# Patient Record
Sex: Male | Born: 1935 | Race: White | Hispanic: No | Marital: Married | Smoking: Former smoker
Health system: Southern US, Community
[De-identification: ages and names within clinical notes are randomized; demographics above are authoritative.]

## PROBLEM LIST (undated history)

## (undated) DIAGNOSIS — M1990 Unspecified osteoarthritis, unspecified site: Secondary | ICD-10-CM

## (undated) DIAGNOSIS — C801 Malignant (primary) neoplasm, unspecified: Secondary | ICD-10-CM

## (undated) HISTORY — PX: FOOT SURGERY: SHX648

## (undated) HISTORY — PX: SKIN CANCER EXCISION: SHX779

## (undated) HISTORY — PX: CATARACT EXTRACTION: SUR2

---

## 2001-01-05 ENCOUNTER — Encounter: Payer: Self-pay | Admitting: Family Medicine

## 2001-01-05 ENCOUNTER — Ambulatory Visit (HOSPITAL_COMMUNITY): Admission: RE | Admit: 2001-01-05 | Discharge: 2001-01-05 | Payer: Self-pay | Admitting: Family Medicine

## 2013-02-02 ENCOUNTER — Other Ambulatory Visit: Payer: Self-pay | Admitting: Orthopedic Surgery

## 2013-02-02 NOTE — Progress Notes (Signed)
Preoperative surgical orders have been place into the Epic hospital system for Joshua Robinson on 02/02/2013, 12:50 PM  by [name] for surgery on 02/19/2013.  Preop Total Knee orders including Experal, PO Tylenol, and IV Decadron as long as there are no contraindications to the above medications. Avel Peace, PA-C  .

## 2013-02-06 ENCOUNTER — Encounter (HOSPITAL_COMMUNITY): Payer: Self-pay | Admitting: Pharmacy Technician

## 2013-02-12 ENCOUNTER — Ambulatory Visit (HOSPITAL_COMMUNITY)
Admission: RE | Admit: 2013-02-12 | Discharge: 2013-02-12 | Disposition: A | Discharge status: Home / Self Care | Payer: Medicare Other | Source: Ambulatory Visit | Attending: Orthopedic Surgery | Admitting: Orthopedic Surgery

## 2013-02-12 ENCOUNTER — Other Ambulatory Visit: Payer: Self-pay

## 2013-02-12 ENCOUNTER — Encounter (HOSPITAL_COMMUNITY): Payer: Self-pay

## 2013-02-12 ENCOUNTER — Encounter (HOSPITAL_COMMUNITY)
Admission: RE | Admit: 2013-02-12 | Discharge: 2013-02-12 | Disposition: A | Discharge status: Home / Self Care | Payer: Medicare Other | Source: Ambulatory Visit | Attending: Orthopedic Surgery | Admitting: Orthopedic Surgery

## 2013-02-12 DIAGNOSIS — Z01812 Encounter for preprocedural laboratory examination: Secondary | ICD-10-CM | POA: Insufficient documentation

## 2013-02-12 DIAGNOSIS — Z01818 Encounter for other preprocedural examination: Secondary | ICD-10-CM | POA: Insufficient documentation

## 2013-02-12 DIAGNOSIS — M1710 Unilateral primary osteoarthritis, unspecified knee: Secondary | ICD-10-CM | POA: Insufficient documentation

## 2013-02-12 DIAGNOSIS — Z01810 Encounter for preprocedural cardiovascular examination: Secondary | ICD-10-CM | POA: Insufficient documentation

## 2013-02-12 HISTORY — DX: Malignant (primary) neoplasm, unspecified: C80.1

## 2013-02-12 HISTORY — DX: Unspecified osteoarthritis, unspecified site: M19.90

## 2013-02-12 LAB — COMPREHENSIVE METABOLIC PANEL
Alkaline Phosphatase: 61 U/L (ref 39–117)
BUN: 17 mg/dL (ref 6–23)
CO2: 29 mEq/L (ref 19–32)
Chloride: 99 mEq/L (ref 96–112)
Creatinine, Ser: 0.81 mg/dL (ref 0.50–1.35)
GFR calc non Af Amer: 84 mL/min — ABNORMAL LOW (ref >90)
Glucose, Bld: 100 mg/dL — ABNORMAL HIGH (ref 70–99)
Total Bilirubin: 0.7 mg/dL (ref 0.3–1.2)

## 2013-02-12 LAB — CBC
HCT: 44 % (ref 39.0–52.0)
Hemoglobin: 15.7 g/dL (ref 13.0–17.0)
MCH: 33.9 pg (ref 26.0–34.0)
MCV: 95 fL (ref 78.0–100.0)
RBC: 4.63 MIL/uL (ref 4.22–5.81)
WBC: 7.3 10*3/uL (ref 4.0–10.5)

## 2013-02-12 LAB — URINALYSIS, ROUTINE W REFLEX MICROSCOPIC
Bilirubin Urine: NEGATIVE
Glucose, UA: NEGATIVE mg/dL
Hgb urine dipstick: NEGATIVE
Ketones, ur: NEGATIVE mg/dL
Leukocytes, UA: NEGATIVE
Nitrite: NEGATIVE
Specific Gravity, Urine: 1.011 (ref 1.005–1.030)
Urobilinogen, UA: 0.2 mg/dL (ref 0.0–1.0)

## 2013-02-12 LAB — PROTIME-INR
INR: 0.95 (ref 0.00–1.49)
Prothrombin Time: 12.5 seconds (ref 11.6–15.2)

## 2013-02-12 LAB — ABO/RH: ABO/RH(D): A POS

## 2013-02-12 LAB — SURGICAL PCR SCREEN: Staphylococcus aureus: NEGATIVE

## 2013-02-12 NOTE — Progress Notes (Signed)
Surgery clearance note 12/25/12 Dr. Regino Schultze on chart

## 2013-02-12 NOTE — Patient Instructions (Addendum)
22 Joshua Robinson  02/12/2013   Your procedure is scheduled on: 02/19/13  Report to [facility] at 7:30 AM.  Call this number if you have problems the morning of surgery [phone]: [phone]   Remember:   Do not eat food or drink liquids After Midnight.      Do not wear jewelry, make-up or nail polish.  Do not wear lotions, powders, or perfumes. You may wear deodorant.  Do not shave 48 hours prior to surgery. Men may shave face and neck.  Do not bring valuables to the hospital.  Contacts, dentures or bridgework may not be worn into surgery.  Leave suitcase in the car. After surgery it may be brought to your room.  For patients admitted to the hospital, checkout time is 11:00 AM the day of discharge.   Please read over the following fact sheets that you were given: MRSA Information, incentive spirometry fact sheet, blood fact sheet Birdie Sons, RN  pre op nurse call if needed [phone]    FAILURE TO FOLLOW THESE INSTRUCTIONS MAY RESULT IN CANCELLATION OF YOUR SURGERY   Patient Signature: ___________________________________________

## 2013-02-16 ENCOUNTER — Other Ambulatory Visit: Payer: Self-pay | Admitting: Surgical

## 2013-02-16 NOTE — H&P (Signed)
TOTAL KNEE ADMISSION H&P  Patient is being admitted for right total knee arthroplasty.  Subjective:  Chief Complaint:right knee pain.  HPI: Joshua Robinson, 78 y.o. male, has a history of pain and functional disability in the right knee due to arthritis and has failed non-surgical conservative treatments for greater than 12 weeks to includeNSAID's and/or analgesics, corticosteriod injections, flexibility and strengthening excercises and activity modification.  Onset of symptoms was gradual, starting 5 years ago with gradually worsening course since that time. The patient noted no past surgery on the right knee(s).  Patient currently rates pain in the right knee(s) at 6 out of 10 with activity. Patient has night pain, worsening of pain with activity and weight bearing, pain that interferes with activities of daily living, pain with passive range of motion, crepitus and joint swelling.  Patient has evidence of periarticular osteophytes and joint space narrowing by imaging studies. There is no active infection.  Past Medical History  Diagnosis Date  . Cancer     skin  . Arthritis     Past Surgical History  Procedure Laterality Date  . Cataract extraction Right   . Skin cancer excision Right     cheek  . Foot surgery Bilateral 78 years old    "painful flat foot surgery"     Current outpatient prescriptions: fish oil-omega-3 fatty acids 1000 MG capsule, Take 1 g by mouth daily., Disp: , Rfl: ;   Multiple Vitamin (MULTIVITAMIN WITH MINERALS) TABS tablet, Take 1 tablet by mouth daily., Disp: , Rfl: ;   naproxen sodium (ANAPROX) 220 MG tablet, Take 220 mg by mouth 2 (two) times daily as needed (for pain)., Disp: , Rfl:  polyvinyl alcohol (LIQUIFILM TEARS) 1.4 % ophthalmic solution, Place 1 drop into both eyes daily as needed (dry eyes)., Disp: , Rfl:   Allergies  Allergen Reactions  . Bee Venom Other (See Comments)    Stung a few times and passed out    History  Substance Use Topics   . Smoking status: Former Smoker -- 40 years    Types: Cigarettes    Quit date: 05/17/1996  . Smokeless tobacco: Current User    Types: Snuff  . Alcohol Use: No    Family History Father deceased age 74 due to heart disease Mother deceased age 18 due to complications from DM type II  Review of Systems  Constitutional: Negative.   HENT: Negative.  Negative for neck pain.   Eyes: Negative.   Respiratory: Positive for shortness of breath. Negative for cough, hemoptysis, sputum production and wheezing.   Cardiovascular: Negative.   Gastrointestinal: Negative.   Genitourinary: Negative.   Musculoskeletal: Positive for joint pain. Negative for myalgias, back pain and falls.       Right knee pain  Skin: Negative.   Neurological: Negative.   Endo/Heme/Allergies: Negative.   Psychiatric/Behavioral: Negative.     Objective:  Physical Exam  Constitutional: He is oriented to person, place, and time. He appears well-developed and well-nourished. No distress.  HENT:  Head: Normocephalic and atraumatic.  Right Ear: External ear normal.  Left Ear: External ear normal.  Mouth/Throat: Oropharynx is clear and moist.  Eyes: Conjunctivae and EOM are normal.  Neck: Normal range of motion. Neck supple. No thyromegaly present.  Cardiovascular: Normal rate, regular rhythm, normal heart sounds and intact distal pulses.   No murmur heard. Respiratory: Effort normal and breath sounds normal. No respiratory distress. He has no wheezes.  GI: Soft. Bowel sounds are normal. He exhibits no  distension. There is no tenderness.  Musculoskeletal:       Right hip: Normal.       Left hip: Normal.       Right knee: He exhibits decreased range of motion and swelling. He exhibits no effusion and no erythema. Tenderness found. Medial joint line and lateral joint line tenderness noted.       Left knee: Normal.       Right lower leg: He exhibits no tenderness and no swelling.       Left lower leg: He exhibits no  tenderness and no swelling.  Evaluation of his hips, normal range of motion and no discomfort. The left knee no effusion. Range 0 to 125. Slight crepitus on range of motion. No tenderness or instability. The right knee no effusion. Range about 5 to 125. Varus deformity. Marked crepitus on range of motion. Tender medial greater than lateral. No instability.  Neurological: He is alert and oriented to person, place, and time. He has normal strength and normal reflexes. No sensory deficit.  Skin: No rash noted. He is not diaphoretic. No erythema.  Psychiatric: He has a normal mood and affect. His behavior is normal.   Vitals Weight: 160 lb Height: 69 in Body Surface Area: 1.88 m Body Mass Index: 23.63 kg/m Pulse: 66 (Regular) BP: 126/78 (Sitting, Left Arm, Standard)  Imaging Review Plain radiographs demonstrate severe degenerative joint disease of the right knee(s). The overall alignment ismild varus. The bone quality appears to be adequate for age and reported activity level.  Assessment/Plan:  End stage arthritis, right knee   The patient history, physical examination, clinical judgment of the provider and imaging studies are consistent with end stage degenerative joint disease of the right knee(s) and total knee arthroplasty is deemed medically necessary. The treatment options including medical management, injection therapy arthroscopy and arthroplasty were discussed at length. The risks and benefits of total knee arthroplasty were presented and reviewed. The risks due to aseptic loosening, infection, stiffness, patella tracking problems, thromboembolic complications and other imponderables were discussed. The patient acknowledged the explanation, agreed to proceed with the plan and consent was signed. Patient is being admitted for inpatient treatment for surgery, pain control, PT, OT, prophylactic antibiotics, VTE prophylaxis, progressive ambulation and ADL's and discharge  planning. The patient is planning to be discharged home with home health services   Patient can receive TXA   Dimitri Ped, PA-C

## 2013-02-19 ENCOUNTER — Inpatient Hospital Stay (HOSPITAL_COMMUNITY): Payer: Medicare Other | Admitting: Certified Registered Nurse Anesthetist

## 2013-02-19 ENCOUNTER — Encounter (HOSPITAL_COMMUNITY)
Admission: RE | Disposition: A | Discharge status: Home Health | Payer: Self-pay | Source: Ambulatory Visit | Attending: Orthopedic Surgery

## 2013-02-19 ENCOUNTER — Encounter (HOSPITAL_COMMUNITY): Payer: Self-pay | Admitting: Certified Registered Nurse Anesthetist

## 2013-02-19 ENCOUNTER — Inpatient Hospital Stay (HOSPITAL_COMMUNITY)
Admission: RE | Admit: 2013-02-19 | Discharge: 2013-02-21 | DRG: 470 | Disposition: A | Discharge status: Home Health | Payer: Medicare Other | Source: Ambulatory Visit | Attending: Orthopedic Surgery | Admitting: Orthopedic Surgery

## 2013-02-19 ENCOUNTER — Encounter (HOSPITAL_COMMUNITY): Payer: Self-pay

## 2013-02-19 DIAGNOSIS — E871 Hypo-osmolality and hyponatremia: Secondary | ICD-10-CM

## 2013-02-19 DIAGNOSIS — Z96651 Presence of right artificial knee joint: Secondary | ICD-10-CM

## 2013-02-19 DIAGNOSIS — Z85828 Personal history of other malignant neoplasm of skin: Secondary | ICD-10-CM

## 2013-02-19 DIAGNOSIS — M1710 Unilateral primary osteoarthritis, unspecified knee: Principal | ICD-10-CM | POA: Diagnosis present

## 2013-02-19 DIAGNOSIS — Z87891 Personal history of nicotine dependence: Secondary | ICD-10-CM

## 2013-02-19 DIAGNOSIS — Z79899 Other long term (current) drug therapy: Secondary | ICD-10-CM

## 2013-02-19 DIAGNOSIS — D62 Acute posthemorrhagic anemia: Secondary | ICD-10-CM

## 2013-02-19 HISTORY — PX: TOTAL KNEE ARTHROPLASTY: SHX125

## 2013-02-19 LAB — TYPE AND SCREEN: Antibody Screen: NEGATIVE

## 2013-02-19 SURGERY — ARTHROPLASTY, KNEE, TOTAL
Anesthesia: General | Site: Knee | Laterality: Right | Wound class: Clean

## 2013-02-19 MED ORDER — HYDROMORPHONE HCL PF 1 MG/ML IJ SOLN
INTRAMUSCULAR | Status: AC
  Filled 2013-02-19: qty 1

## 2013-02-19 MED ORDER — DEXAMETHASONE SODIUM PHOSPHATE 10 MG/ML IJ SOLN: 10.0000 mg | Freq: Once | INTRAMUSCULAR | Status: DC

## 2013-02-19 MED ORDER — METHOCARBAMOL 500 MG PO TABS
500.0000 mg | ORAL_TABLET | Freq: Four times a day (QID) | ORAL | Status: DC | PRN
  Administered 2013-02-20 – 2013-02-21 (×3): 500 mg via ORAL
  Filled 2013-02-19 (×3): qty 1

## 2013-02-19 MED ORDER — CEFAZOLIN SODIUM-DEXTROSE 2-3 GM-% IV SOLR
INTRAVENOUS | Status: AC
  Filled 2013-02-19: qty 50

## 2013-02-19 MED ORDER — ONDANSETRON HCL 4 MG/2ML IJ SOLN
4.0000 mg | Freq: Four times a day (QID) | INTRAMUSCULAR | Status: DC | PRN
  Administered 2013-02-19: 4 mg via INTRAVENOUS
  Filled 2013-02-19: qty 2

## 2013-02-19 MED ORDER — BISACODYL 10 MG RE SUPP: 10.0000 mg | Freq: Every day | RECTAL | Status: DC | PRN

## 2013-02-19 MED ORDER — SODIUM CHLORIDE 0.9 % IJ SOLN
INTRAMUSCULAR | Status: DC | PRN
  Administered 2013-02-19: 30 mL

## 2013-02-19 MED ORDER — ONDANSETRON HCL 4 MG/2ML IJ SOLN
INTRAMUSCULAR | Status: DC | PRN
  Administered 2013-02-19: 4 mg via INTRAMUSCULAR

## 2013-02-19 MED ORDER — FLEET ENEMA 7-19 GM/118ML RE ENEM: 1.0000 | ENEMA | Freq: Once | RECTAL | Status: AC | PRN

## 2013-02-19 MED ORDER — METOCLOPRAMIDE HCL 10 MG PO TABS: 5.0000 mg | ORAL_TABLET | Freq: Three times a day (TID) | ORAL | Status: DC | PRN

## 2013-02-19 MED ORDER — CEFAZOLIN SODIUM 1-5 GM-% IV SOLN
1.0000 g | Freq: Four times a day (QID) | INTRAVENOUS | Status: AC
  Administered 2013-02-19 (×2): 1 g via INTRAVENOUS
  Filled 2013-02-19 (×2): qty 50

## 2013-02-19 MED ORDER — SODIUM CHLORIDE 0.9 % IJ SOLN
INTRAMUSCULAR | Status: AC
  Filled 2013-02-19: qty 50

## 2013-02-19 MED ORDER — DEXAMETHASONE SODIUM PHOSPHATE 10 MG/ML IJ SOLN
10.0000 mg | Freq: Every day | INTRAMUSCULAR | Status: AC
  Filled 2013-02-19: qty 1

## 2013-02-19 MED ORDER — ONDANSETRON HCL 4 MG PO TABS: 4.0000 mg | ORAL_TABLET | Freq: Four times a day (QID) | ORAL | Status: DC | PRN

## 2013-02-19 MED ORDER — POLYVINYL ALCOHOL 1.4 % OP SOLN
1.0000 [drp] | Freq: Every day | OPHTHALMIC | Status: DC | PRN
  Filled 2013-02-19: qty 15

## 2013-02-19 MED ORDER — PROPOFOL 10 MG/ML IV BOLUS
INTRAVENOUS | Status: DC | PRN
  Administered 2013-02-19: 150 mg via INTRAVENOUS

## 2013-02-19 MED ORDER — POLYETHYLENE GLYCOL 3350 17 G PO PACK: 17.0000 g | PACK | Freq: Every day | ORAL | Status: DC | PRN

## 2013-02-19 MED ORDER — DIPHENHYDRAMINE HCL 12.5 MG/5ML PO ELIX: 12.5000 mg | ORAL_SOLUTION | ORAL | Status: DC | PRN

## 2013-02-19 MED ORDER — MENTHOL 3 MG MT LOZG: 1.0000 | LOZENGE | OROMUCOSAL | Status: DC | PRN

## 2013-02-19 MED ORDER — BUPIVACAINE HCL (PF) 0.25 % IJ SOLN
INTRAMUSCULAR | Status: AC
  Filled 2013-02-19: qty 30

## 2013-02-19 MED ORDER — LIDOCAINE HCL (CARDIAC) 20 MG/ML IV SOLN
INTRAVENOUS | Status: DC | PRN
  Administered 2013-02-19: 50 mg via INTRAVENOUS

## 2013-02-19 MED ORDER — BUPIVACAINE LIPOSOME 1.3 % IJ SUSP
20.0000 mL | Freq: Once | INTRAMUSCULAR | Status: DC
  Filled 2013-02-19: qty 20

## 2013-02-19 MED ORDER — ACETAMINOPHEN 500 MG PO TABS
1000.0000 mg | ORAL_TABLET | Freq: Four times a day (QID) | ORAL | Status: AC
  Administered 2013-02-19 – 2013-02-20 (×4): 1000 mg via ORAL
  Filled 2013-02-19 (×5): qty 2

## 2013-02-19 MED ORDER — CEFAZOLIN SODIUM-DEXTROSE 2-3 GM-% IV SOLR
2.0000 g | INTRAVENOUS | Status: AC
  Administered 2013-02-19: 2 g via INTRAVENOUS

## 2013-02-19 MED ORDER — LACTATED RINGERS IV SOLN: INTRAVENOUS | Status: DC

## 2013-02-19 MED ORDER — BUPIVACAINE HCL 0.25 % IJ SOLN
INTRAMUSCULAR | Status: DC | PRN
  Administered 2013-02-19: 30 mL

## 2013-02-19 MED ORDER — DEXTROSE-NACL 5-0.9 % IV SOLN
INTRAVENOUS | Status: DC
  Administered 2013-02-19 – 2013-02-20 (×2): via INTRAVENOUS

## 2013-02-19 MED ORDER — DEXAMETHASONE 4 MG PO TABS
10.0000 mg | ORAL_TABLET | Freq: Every day | ORAL | Status: AC
  Administered 2013-02-20: 10 mg via ORAL
  Filled 2013-02-19: qty 1

## 2013-02-19 MED ORDER — PHENOL 1.4 % MT LIQD: 1.0000 | OROMUCOSAL | Status: DC | PRN

## 2013-02-19 MED ORDER — FENTANYL CITRATE 0.05 MG/ML IJ SOLN
INTRAMUSCULAR | Status: DC | PRN
  Administered 2013-02-19 (×2): 100 ug via INTRAVENOUS

## 2013-02-19 MED ORDER — OXYCODONE HCL 5 MG PO TABS
5.0000 mg | ORAL_TABLET | ORAL | Status: DC | PRN
  Administered 2013-02-19 – 2013-02-20 (×4): 5 mg via ORAL
  Administered 2013-02-20: 10 mg via ORAL
  Administered 2013-02-20 – 2013-02-21 (×2): 5 mg via ORAL
  Filled 2013-02-19 (×6): qty 1
  Filled 2013-02-19: qty 2

## 2013-02-19 MED ORDER — METOCLOPRAMIDE HCL 5 MG/ML IJ SOLN
5.0000 mg | Freq: Three times a day (TID) | INTRAMUSCULAR | Status: DC | PRN
  Administered 2013-02-19: 10 mg via INTRAVENOUS
  Filled 2013-02-19: qty 2

## 2013-02-19 MED ORDER — ACETAMINOPHEN 500 MG PO TABS
1000.0000 mg | ORAL_TABLET | Freq: Once | ORAL | Status: AC
  Administered 2013-02-19: 1000 mg via ORAL
  Filled 2013-02-19: qty 2

## 2013-02-19 MED ORDER — HYDROMORPHONE HCL PF 1 MG/ML IJ SOLN
0.2500 mg | INTRAMUSCULAR | Status: DC | PRN
Start: 2013-02-19 — End: 2013-02-19
  Administered 2013-02-19 (×2): 0.5 mg via INTRAVENOUS

## 2013-02-19 MED ORDER — BUPIVACAINE LIPOSOME 1.3 % IJ SUSP
INTRAMUSCULAR | Status: DC | PRN
  Administered 2013-02-19: 20 mL

## 2013-02-19 MED ORDER — DOCUSATE SODIUM 100 MG PO CAPS
100.0000 mg | ORAL_CAPSULE | Freq: Two times a day (BID) | ORAL | Status: DC
  Administered 2013-02-19 – 2013-02-20 (×3): 100 mg via ORAL

## 2013-02-19 MED ORDER — METHOCARBAMOL 100 MG/ML IJ SOLN
500.0000 mg | Freq: Four times a day (QID) | INTRAVENOUS | Status: DC | PRN
  Administered 2013-02-19: 500 mg via INTRAVENOUS
  Filled 2013-02-19: qty 5

## 2013-02-19 MED ORDER — TRAMADOL HCL 50 MG PO TABS: 50.0000 mg | ORAL_TABLET | Freq: Four times a day (QID) | ORAL | Status: DC | PRN

## 2013-02-19 MED ORDER — DEXAMETHASONE SODIUM PHOSPHATE 10 MG/ML IJ SOLN
INTRAMUSCULAR | Status: DC | PRN
  Administered 2013-02-19: 10 mg via INTRAVENOUS

## 2013-02-19 MED ORDER — LACTATED RINGERS IV SOLN
INTRAVENOUS | Status: DC
  Administered 2013-02-19: 1000 mL via INTRAVENOUS
  Administered 2013-02-19 (×3): via INTRAVENOUS

## 2013-02-19 MED ORDER — MORPHINE SULFATE 2 MG/ML IJ SOLN
1.0000 mg | INTRAMUSCULAR | Status: DC | PRN
  Administered 2013-02-19: 2 mg via INTRAVENOUS
  Filled 2013-02-19: qty 1

## 2013-02-19 MED ORDER — KETOROLAC TROMETHAMINE 15 MG/ML IJ SOLN: 7.5000 mg | Freq: Four times a day (QID) | INTRAMUSCULAR | Status: AC | PRN

## 2013-02-19 MED ORDER — HYDROMORPHONE HCL PF 1 MG/ML IJ SOLN
INTRAMUSCULAR | Status: DC | PRN
  Administered 2013-02-19 (×2): 1 mg via INTRAVENOUS

## 2013-02-19 MED ORDER — SODIUM CHLORIDE 0.9 % IV SOLN: INTRAVENOUS | Status: DC

## 2013-02-19 MED ORDER — SUCCINYLCHOLINE CHLORIDE 20 MG/ML IJ SOLN
INTRAMUSCULAR | Status: DC | PRN
  Administered 2013-02-19: 100 mg via INTRAVENOUS

## 2013-02-19 MED ORDER — RIVAROXABAN 10 MG PO TABS
10.0000 mg | ORAL_TABLET | Freq: Every day | ORAL | Status: DC
  Administered 2013-02-20 – 2013-02-21 (×2): 10 mg via ORAL
  Filled 2013-02-19 (×3): qty 1

## 2013-02-19 MED ORDER — TRANEXAMIC ACID 100 MG/ML IV SOLN
1000.0000 mg | INTRAVENOUS | Status: AC
  Administered 2013-02-19: 1000 mg via INTRAVENOUS
  Filled 2013-02-19: qty 10

## 2013-02-19 SURGICAL SUPPLY — 57 items
BAG ZIPLOCK 12X15 (MISCELLANEOUS) ×2 IMPLANT
BANDAGE ELASTIC 6 VELCRO ST LF (GAUZE/BANDAGES/DRESSINGS) ×2 IMPLANT
BANDAGE ESMARK 6X9 LF (GAUZE/BANDAGES/DRESSINGS) ×1 IMPLANT
BLADE SAG 18X100X1.27 (BLADE) ×2 IMPLANT
BLADE SAW SGTL 11.0X1.19X90.0M (BLADE) ×2 IMPLANT
BNDG ESMARK 6X9 LF (GAUZE/BANDAGES/DRESSINGS) ×2
BOWL SMART MIX CTS (DISPOSABLE) ×2 IMPLANT
CAPT RP KNEE ×2 IMPLANT
CEMENT HV SMART SET (Cement) ×4 IMPLANT
CLOSURE STERI-STRIP 1/4X4 (GAUZE/BANDAGES/DRESSINGS) ×2 IMPLANT
CLOTH BEACON ORANGE TIMEOUT ST (SAFETY) IMPLANT
CUFF TOURN SGL QUICK 34 (TOURNIQUET CUFF) ×1
CUFF TRNQT CYL 34X4X40X1 (TOURNIQUET CUFF) ×1 IMPLANT
DECANTER SPIKE VIAL GLASS SM (MISCELLANEOUS) ×2 IMPLANT
DRAPE EXTREMITY T 121X128X90 (DRAPE) ×2 IMPLANT
DRAPE POUCH INSTRU U-SHP 10X18 (DRAPES) ×2 IMPLANT
DRAPE U-SHAPE 47X51 STRL (DRAPES) ×2 IMPLANT
DRSG ADAPTIC 3X8 NADH LF (GAUZE/BANDAGES/DRESSINGS) ×2 IMPLANT
DRSG PAD ABDOMINAL 8X10 ST (GAUZE/BANDAGES/DRESSINGS) ×2 IMPLANT
DURAPREP 26ML APPLICATOR (WOUND CARE) ×2 IMPLANT
ELECT REM PT RETURN 9FT ADLT (ELECTROSURGICAL) ×2
ELECTRODE REM PT RTRN 9FT ADLT (ELECTROSURGICAL) ×1 IMPLANT
EVACUATOR 1/8 PVC DRAIN (DRAIN) ×2 IMPLANT
FACESHIELD LNG OPTICON STERILE (SAFETY) ×12 IMPLANT
GLOVE BIO SURGEON STRL SZ7.5 (GLOVE) ×2 IMPLANT
GLOVE BIO SURGEON STRL SZ8 (GLOVE) ×2 IMPLANT
GLOVE BIOGEL PI IND STRL 8 (GLOVE) ×2 IMPLANT
GLOVE BIOGEL PI INDICATOR 8 (GLOVE) ×2
GLOVE SURG SS PI 6.5 STRL IVOR (GLOVE) ×2 IMPLANT
GOWN PREVENTION PLUS LG XLONG (DISPOSABLE) ×2 IMPLANT
GOWN STRL REIN XL XLG (GOWN DISPOSABLE) IMPLANT
HANDPIECE INTERPULSE COAX TIP (DISPOSABLE) ×1
IMMOBILIZER KNEE 20 (SOFTGOODS) ×2
IMMOBILIZER KNEE 20 THIGH 36 (SOFTGOODS) ×1 IMPLANT
KIT BASIN OR (CUSTOM PROCEDURE TRAY) ×2 IMPLANT
MANIFOLD NEPTUNE II (INSTRUMENTS) ×2 IMPLANT
NDL SAFETY ECLIPSE 18X1.5 (NEEDLE) ×2 IMPLANT
NEEDLE HYPO 18GX1.5 SHARP (NEEDLE) ×2
NS IRRIG 1000ML POUR BTL (IV SOLUTION) ×2 IMPLANT
PACK TOTAL JOINT (CUSTOM PROCEDURE TRAY) ×2 IMPLANT
PADDING CAST COTTON 6X4 STRL (CAST SUPPLIES) ×2 IMPLANT
POSITIONER SURGICAL ARM (MISCELLANEOUS) ×2 IMPLANT
SET HNDPC FAN SPRY TIP SCT (DISPOSABLE) ×1 IMPLANT
SPONGE GAUZE 4X4 12PLY (GAUZE/BANDAGES/DRESSINGS) ×2 IMPLANT
STRIP CLOSURE SKIN 1/2X4 (GAUZE/BANDAGES/DRESSINGS) ×4 IMPLANT
SUCTION FRAZIER 12FR DISP (SUCTIONS) ×2 IMPLANT
SUT MNCRL AB 4-0 PS2 18 (SUTURE) ×2 IMPLANT
SUT VIC AB 2-0 CT1 27 (SUTURE) ×3
SUT VIC AB 2-0 CT1 TAPERPNT 27 (SUTURE) ×3 IMPLANT
SUT VLOC 180 0 24IN GS25 (SUTURE) ×2 IMPLANT
SYR 20CC LL (SYRINGE) ×2 IMPLANT
SYR 50ML LL SCALE MARK (SYRINGE) ×2 IMPLANT
TOWEL OR 17X26 10 PK STRL BLUE (TOWEL DISPOSABLE) ×4 IMPLANT
TRAY FOLEY CATH 14FRSI W/METER (CATHETERS) IMPLANT
TRAY FOLEY CATH 16FRSI W/METER (SET/KITS/TRAYS/PACK) ×2 IMPLANT
WATER STERILE IRR 1500ML POUR (IV SOLUTION) ×2 IMPLANT
WRAP KNEE MAXI GEL POST OP (GAUZE/BANDAGES/DRESSINGS) ×2 IMPLANT

## 2013-02-19 NOTE — Anesthesia Preprocedure Evaluation (Signed)
Anesthesia Evaluation  Patient identified by MRN, date of birth, ID band Patient awake    Reviewed: Allergy & Precautions, H&P , NPO status , Patient's Chart, lab work & pertinent test results  Airway Mallampati: II TM Distance: >3 FB Neck ROM: full    Dental  (+) Missing and Dental Advisory Given Almost all upper teeth missing.  None loose:   Pulmonary neg pulmonary ROS,  breath sounds clear to auscultation  Pulmonary exam normal       Cardiovascular Exercise Tolerance: Good negative cardio ROS  Rhythm:regular Rate:Normal     Neuro/Psych negative neurological ROS  negative psych ROS   GI/Hepatic negative GI ROS, Neg liver ROS,   Endo/Other  negative endocrine ROS  Renal/GU negative Renal ROS  negative genitourinary   Musculoskeletal   Abdominal   Peds  Hematology negative hematology ROS (+)   Anesthesia Other Findings   Reproductive/Obstetrics negative OB ROS                           Anesthesia Physical Anesthesia Plan  ASA: II  Anesthesia Plan: General   Post-op Pain Management:    Induction: Intravenous  Airway Management Planned: Oral ETT  Additional Equipment:   Intra-op Plan:   Post-operative Plan: Extubation in OR  Informed Consent: I have reviewed the patients History and Physical, chart, labs and discussed the procedure including the risks, benefits and alternatives for the proposed anesthesia with the patient or authorized representative who has indicated his/her understanding and acceptance.   Dental Advisory Given  Plan Discussed with: CRNA and Surgeon  Anesthesia Plan Comments:         Anesthesia Quick Evaluation

## 2013-02-19 NOTE — Preoperative (Signed)
Beta Blockers   Reason not to administer Beta Blockers:Not Applicable 

## 2013-02-19 NOTE — Transfer of Care (Signed)
Immediate Anesthesia Transfer of Care Note  Patient: Joshua Robinson  Procedure(s) Performed: Procedure(s): RIGHT TOTAL KNEE ARTHROPLASTY (Right)  Patient Location: PACU  Anesthesia Type:General  Level of Consciousness: awake, oriented, patient cooperative, lethargic and responds to stimulation  Airway & Oxygen Therapy: Patient Spontanous Breathing and Patient connected to face mask oxygen  Post-op Assessment: Report given to PACU RN, Post -op Vital signs reviewed and stable and Patient moving all extremities  Post vital signs: Reviewed and stable  Complications: No apparent anesthesia complications

## 2013-02-19 NOTE — Op Note (Signed)
Pre-operative diagnosis- Osteoarthritis  Right knee(s)  Post-operative diagnosis- Osteoarthritis Right knee(s)  Procedure-  Right  Total Knee Arthroplasty  Surgeon- Gus Rankin. Dreshon Proffit, MD  Assistant- Avel Peace, PA-C   Anesthesia-  General EBL-* No blood loss amount entered *  Drains Hemovac  Tourniquet time-  Total Tourniquet Time Documented: Thigh (Right) - 29 minutes Total: Thigh (Right) - 29 minutes    Complications- None  Condition-PACU - hemodynamically stable.   Brief Clinical Note  Joshua Robinson is a 78 y.o. year old male with end stage OA of his right knee with progressively worsening pain and dysfunction. He has constant pain, with activity and at rest and significant functional deficits with difficulties even with ADLs. He has had extensive non-op management including analgesics, injections of cortisone and viscosupplements, and home exercise program, but remains in significant pain with significant dysfunction. Radiographs show bone on bone arthritis medial and patellofemoral with varus deformity. He presents now for right Total Knee Arthroplasty.    Procedure in detail---   The patient is brought into the operating room and positioned supine on the operating table. After successful administration of  General,   a tourniquet is placed high on the  Right thigh(s) and the lower extremity is prepped and draped in the usual sterile fashion. Time out is performed by the operating team and then the  Right lower extremity is wrapped in Esmarch, knee flexed and the tourniquet inflated to 300 mmHg.       A midline incision is made with a ten blade through the subcutaneous tissue to the level of the extensor mechanism. A fresh blade is used to make a medial parapatellar arthrotomy. Soft tissue over the proximal medial tibia is subperiosteally elevated to the joint line with a knife and into the semimembranosus bursa with a Cobb elevator. Soft tissue over the proximal lateral tibia  is elevated with attention being paid to avoiding the patellar tendon on the tibial tubercle. The patella is everted, knee flexed 90 degrees and the ACL and PCL are removed. Findings are bone on bone medial and patellofemoral with large medial and patellar osteophytes.        The drill is used to create a starting hole in the distal femur and the canal is thoroughly irrigated with sterile saline to remove the fatty contents. The 5 degree Right  valgus alignment guide is placed into the femoral canal and the distal femoral cutting block is pinned to remove 10 mm off the distal femur. Resection is made with an oscillating saw.      The tibia is subluxed forward and the menisci are removed. The extramedullary alignment guide is placed referencing proximally at the medial aspect of the tibial tubercle and distally along the second metatarsal axis and tibial crest. The block is pinned to remove 2mm off the more deficient medial  side. Resection is made with an oscillating saw. Size 4is the most appropriate size for the tibia and the proximal tibia is prepared with the modular drill and keel punch for that size.      The femoral sizing guide is placed and size 4 is most appropriate. Rotation is marked off the epicondylar axis and confirmed by creating a rectangular flexion gap at 90 degrees. The size 4 cutting block is pinned in this rotation and the anterior, posterior and chamfer cuts are made with the oscillating saw. The intercondylar block is then placed and that cut is made.      Trial size 4  tibial component, trial size 4 posterior stabilized femur and a 10  mm posterior stabilized rotating platform insert trial is placed. Full extension is achieved with excellent varus/valgus and anterior/posterior balance throughout full range of motion. The patella is everted and thickness measured to be 24  mm. Free hand resection is taken to 14 mm, a 38 template is placed, lug holes are drilled, trial patella is placed,  and it tracks normally. Osteophytes are removed off the posterior femur with the trial in place. All trials are removed and the cut bone surfaces prepared with pulsatile lavage. Cement is mixed and once ready for implantation, the size 4 tibial implant, size  4 posterior stabilized femoral component, and the size 38 patella are cemented in place and the patella is held with the clamp. The trial insert is placed and the knee held in full extension. The Exparel (20 ml mixed with 30 ml saline) and .25% Bupivicaine, are injected into the extensor mechanism, posterior capsule, medial and lateral gutters and subcutaneous tissues.  All extruded cement is removed and once the cement is hard the permanent 10 mm posterior stabilized rotating platform insert is placed into the tibial tray.      The wound is copiously irrigated with saline solution and the extensor mechanism closed over a hemovac drain with #1 PDS suture. The tourniquet is released for a total tourniquet time of 29  minutes. Flexion against gravity is 140 degrees and the patella tracks normally. Subcutaneous tissue is closed with 2.0 vicryl and subcuticular with running 4.0 Monocryl. The incision is cleaned and dried and steri-strips and a bulky sterile dressing are applied. The limb is placed into a knee immobilizer and the patient is awakened and transported to recovery in stable condition.      Please note that a surgical assistant was a medical necessity for this procedure in order to perform it in a safe and expeditious manner. Surgical assistant was necessary to retract the ligaments and vital neurovascular structures to prevent injury to them and also necessary for proper positioning of the limb to allow for anatomic placement of the prosthesis.   Gus Rankin Hurley Sobel, MD    02/19/2013, 11:20 AM

## 2013-02-19 NOTE — Anesthesia Postprocedure Evaluation (Signed)
  Anesthesia Post-op Note  Patient: Joshua Robinson  Procedure(s) Performed: Procedure(s) (LRB): RIGHT TOTAL KNEE ARTHROPLASTY (Right)  Patient Location: PACU  Anesthesia Type: General  Level of Consciousness: awake and alert   Airway and Oxygen Therapy: Patient Spontanous Breathing  Post-op Pain: mild  Post-op Assessment: Post-op Vital signs reviewed, Patient's Cardiovascular Status Stable, Respiratory Function Stable, Patent Airway and No signs of Nausea or vomiting  Last Vitals:  Filed Vitals:   02/19/13 1230  BP: 150/76  Pulse:   Temp:   Resp:     Post-op Vital Signs: stable   Complications: No apparent anesthesia complications

## 2013-02-19 NOTE — Progress Notes (Signed)
Utilization review completed.  

## 2013-02-19 NOTE — Interval H&P Note (Signed)
History and Physical Interval Note:  02/19/2013 9:17 AM  Joshua Robinson  has presented today for surgery, with the diagnosis of OSTEOARTHRITIS RIGHT KNEE  The various methods of treatment have been discussed with the patient and family. After consideration of risks, benefits and other options for treatment, the patient has consented to  Procedure(s): RIGHT TOTAL KNEE ARTHROPLASTY (Right) as a surgical intervention .  The patient's history has been reviewed, patient examined, no change in status, stable for surgery.  I have reviewed the patient's chart and labs.  Questions were answered to the patient's satisfaction.     Loanne Drilling

## 2013-02-20 LAB — BASIC METABOLIC PANEL WITH GFR
BUN: 13 mg/dL (ref 6–23)
CO2: 25 meq/L (ref 19–32)
Calcium: 8.1 mg/dL — ABNORMAL LOW (ref 8.4–10.5)
Chloride: 100 meq/L (ref 96–112)
Creatinine, Ser: 0.76 mg/dL (ref 0.50–1.35)
GFR calc Af Amer: >90 mL/min
GFR calc non Af Amer: 86 mL/min — ABNORMAL LOW
Glucose, Bld: 128 mg/dL — ABNORMAL HIGH (ref 70–99)
Potassium: 3.7 meq/L (ref 3.5–5.1)
Sodium: 134 meq/L — ABNORMAL LOW (ref 135–145)

## 2013-02-20 LAB — CBC
MCHC: 36.8 g/dL — ABNORMAL HIGH (ref 30.0–36.0)
MCV: 94.8 fL (ref 78.0–100.0)
Platelets: 221 10*3/uL (ref 150–400)
RBC: 3.64 MIL/uL — ABNORMAL LOW (ref 4.22–5.81)
WBC: 12.6 10*3/uL — ABNORMAL HIGH (ref 4.0–10.5)

## 2013-02-20 NOTE — Progress Notes (Signed)
Physical Therapy Treatment Patient Details Name: Joshua Robinson MRN: [id number] DOB: 1934/05/30 Today's Date: 02/20/2013 Time: 9811-9147 PT Time Calculation (min): 27 min  PT Assessment / Plan / Recommendation  History of Present Illness RTKA on 02/19/13   PT Comments   Pt is ambulating without KI and tolerates well. Good quad strength. Ces for safe use of walker as a SW.  Follow Up Recommendations  Home health PT     Does the patient have the potential to tolerate intense rehabilitation     Barriers to Discharge        Equipment Recommendations  None recommended by PT (pt has borrowed a SW)    Recommendations for Other Services    Frequency 7X/week   Progress towards PT Goals Progress towards PT goals: Progressing toward goals  Plan Current plan remains appropriate    Precautions / Restrictions Precautions Precautions: Knee Required Braces or Orthoses:  (did not wear KI) Knee Immobilizer - Right: Discontinue once straight leg raise with < 10 degree lag   Pertinent Vitals/Pain 8 after ambulating Ice applied. Leg propped in neutral, RN brought pain med.    Mobility  Bed Mobility Bed Mobility: Sit to Supine Supine to Sit: 4: Min assist Sitting - Scoot to Edge of Bed: 4: Min assist Sit to Supine: 4: Min assist Details for Bed Mobility Assistance: support of RLE as pt got into bed. Transfers Transfers: Sit to Stand;Stand to Sit Sit to Stand: 4: Min assist;From chair/3-in-1;With armrests;With upper extremity assist Stand to Sit: With upper extremity assist;To bed;To elevated surface;4: Min guard Details for Transfer Assistance: cues for use of UE's and for RLE position. Ambulation/Gait Ambulation/Gait Assistance: 4: Min guard Ambulation Distance (Feet): 175 Feet Assistive device: Rolling walker Ambulation/Gait Assistance Details: Cues for safe placement of RW used as SW, tends to take a long step./ Gait Pattern: Step-to pattern;Antalgic General Gait Details: pt was  trembling after walking. reported initially dizzy then improved after sat down for a few minutes.    Exercises Total Joint Exercises Quad Sets: AROM;Right;10 reps;Supine   PT Diagnosis: Difficulty walking;Acute pain  PT Problem List: Decreased strength;Decreased range of motion;Decreased activity tolerance;Decreased mobility;Pain;Decreased knowledge of precautions;Decreased safety awareness;Decreased knowledge of use of DME PT Treatment Interventions: DME instruction;Gait training;Stair training;Functional mobility training;Therapeutic activities;Therapeutic exercise;Patient/family education   PT Goals (current goals can now be found in the care plan section) Acute Rehab PT Goals Patient Stated Goal: I want to walk better and without pain. PT Goal Formulation: With patient/family Time For Goal Achievement: 02/27/13 Potential to Achieve Goals: Good  Visit Information  Last PT Received On: 02/20/13 Assistance Needed: +1 History of Present Illness: RTKA on 02/19/13    Subjective Data  Patient Stated Goal: I want to walk better and without pain.   Cognition  Cognition Arousal/Alertness: Awake/alert Behavior During Therapy: WFL for tasks assessed/performed Overall Cognitive Status: Within Functional Limits for tasks assessed    Balance     End of Session PT - End of Session Activity Tolerance: Patient tolerated treatment well Patient left: with call bell/phone within reach;with family/visitor present Nurse Communication: Mobility status;Patient requests pain meds   GP     Rada Hay 02/20/2013, 2:16 PM

## 2013-02-20 NOTE — Evaluation (Signed)
Physical Therapy Evaluation Patient Details Name: Joshua Robinson MRN: [id number] DOB: 1934/08/02 Today's Date: 02/20/2013 Time: 0920-0950 PT Time Calculation (min): 30 min  PT Assessment / Plan / Recommendation History of Present Illness  RTKA on 02/19/13  Clinical Impression  Pt tolerated ambualting in the hall today.Pt plans to DC home. Pt will benefit from PT to address problems  Listed. Pt has SW at home which should be  OK.    PT Assessment  Patient needs continued PT services    Follow Up Recommendations  Home health PT    Does the patient have the potential to tolerate intense rehabilitation      Barriers to Discharge        Equipment Recommendations  Rolling walker with 5" wheels    Recommendations for Other Services     Frequency 7X/week    Precautions / Restrictions Precautions Required Braces or Orthoses: Knee Immobilizer - Right Knee Immobilizer - Right: Discontinue once straight leg raise with < 10 degree lag   Pertinent Vitals/Pain 3-4.5 R knee. Ice applied.      Mobility  Bed Mobility Bed Mobility: Supine to Sit;Sitting - Scoot to Edge of Bed Supine to Sit: 4: Min assist Sitting - Scoot to [place] of Bed: 4: Min assist Details for Bed Mobility Assistance: support of RLE as pt slid to edge Transfers Transfers: Sit to Stand;Stand to Sit Sit to Stand: 4: Min assist;With upper extremity assist;From bed Stand to Sit: 4: Min assist;With upper extremity assist;To chair/3-in-1 Details for Transfer Assistance: cues for use of UE's and for RLE position. Ambulation/Gait Ambulation/Gait Assistance: 4: Min assist Ambulation Distance (Feet): 60 Feet (20' first.) Assistive device: Rolling walker Ambulation/Gait Assistance Details: used RW as SW to practice. Gait Pattern: Step-to pattern;Antalgic General Gait Details: pt was trembling after walking. reported initially dizzy then improved after sat down for a few minutes.    Exercises Total Joint Exercises Quad  Sets: AROM;Right;10 reps;Supine   PT Diagnosis: Difficulty walking;Acute pain  PT Problem List: Decreased strength;Decreased range of motion;Decreased activity tolerance;Decreased mobility;Pain;Decreased knowledge of precautions;Decreased safety awareness;Decreased knowledge of use of DME PT Treatment Interventions: DME instruction;Gait training;Stair training;Functional mobility training;Therapeutic activities;Therapeutic exercise;Patient/family education     PT Goals(Current goals can be found in the care plan section) Acute Rehab PT Goals Patient Stated Goal: I want to walk better and without pain. PT Goal Formulation: With patient/family Time For Goal Achievement: 02/27/13 Potential to Achieve Goals: Good  Visit Information  Last PT Received On: 02/20/13 Assistance Needed: +1 History of Present Illness: RTKA on 02/19/13       Prior Functioning  Home Living Family/patient expects to be discharged to:: Private residence Living Arrangements: Spouse/significant other Available Help at Discharge: Family Type of Home: House Home Access: Stairs to enter Secretary/administrator of Steps: 1 Home Layout: One level Home Equipment: Environmental consultant - standard;Cane - single point Prior Function Level of Independence: Independent Communication Communication: No difficulties    Cognition  Cognition Arousal/Alertness: Awake/alert Behavior During Therapy: WFL for tasks assessed/performed Overall Cognitive Status: Within Functional Limits for tasks assessed    Extremity/Trunk Assessment Upper Extremity Assessment Upper Extremity Assessment: Overall WFL for tasks assessed Lower Extremity Assessment Lower Extremity Assessment: RLE deficits/detail RLE Deficits / Details: able to perform a SLR   Balance    End of Session PT - End of Session Activity Tolerance: Patient tolerated treatment well Patient left: in chair;with call bell/phone within reach;with family/visitor present Nurse  Communication: Mobility status  GP  Rada Hay 02/20/2013, 1:00 PM

## 2013-02-20 NOTE — Progress Notes (Signed)
   Subjective: 1 Day Post-Op Procedure(s) (LRB): RIGHT TOTAL KNEE ARTHROPLASTY (Right) Patient reports pain as mild.   Patient seen in rounds for Dr. Lequita Halt. Patient is well, and has had no acute complaints or problems We will start therapy today.  Plan is to go Home after hospital stay.  Objective: Vital signs in last 24 hours: Temp:  [97.5 F (36.4 C)-98.6 F (37 C)] 98.2 F (36.8 C) (10/07 1115) Pulse Rate:  [59-82] 76 (10/07 1115) Resp:  [10-18] 18 (10/07 1200) BP: (101-155)/(55-82) 130/69 mmHg (10/07 1115) SpO2:  [96 %-100 %] 99 % (10/07 1115) Weight:  [76.658 kg (169 lb)] 76.658 kg (169 lb) (10/06 1314)  Intake/Output from previous day:  Intake/Output Summary (Last 24 hours) at 02/20/13 1236 Last data filed at 02/20/13 1000  Gross per 24 hour  Intake 1846.25 ml  Output   3065 ml  Net -1218.75 ml    Intake/Output this shift: Total I/O In: 538.8 [P.O.:240; I.V.:298.8] Out: 200 [Urine:200]  Labs:  Recent Labs  02/20/13 0410  HGB 12.7*    Recent Labs  02/20/13 0410  WBC 12.6*  RBC 3.64*  HCT 34.5*  PLT 221    Recent Labs  02/20/13 0410  NA 134*  K 3.7  CL 100  CO2 25  BUN 13  CREATININE 0.76  GLUCOSE 128*  CALCIUM 8.1*   No results found for this basename: LABPT, INR,  in the last 72 hours  EXAM General - Patient is Alert, Appropriate and Oriented Extremity - Neurovascular intact Sensation intact distally Dressing - dressing C/D/I Motor Function - intact, moving foot and toes well on exam.  Hemovac pulled without difficulty.  Past Medical History  Diagnosis Date  . Cancer     skin  . Arthritis     Assessment/Plan: 1 Day Post-Op Procedure(s) (LRB): RIGHT TOTAL KNEE ARTHROPLASTY (Right) Principal Problem:   OA (osteoarthritis) of knee  Estimated body mass index is 25.7 kg/(m^2) as calculated from the following:   Height as of this encounter: 5\' 8"  (1.727 m).   Weight as of this encounter: 76.658 kg (169 lb). Advance diet Up  with therapy Plan for discharge tomorrow Discharge home with home health  DVT Prophylaxis - Xarelto Weight-Bearing as tolerated to right leg No vaccines. D/C O2 and Pulse OX and try on Room [address]  Joshua Robinson 02/20/2013, 12:36 PM

## 2013-02-20 NOTE — Care Management Note (Addendum)
    Page 1 of 2   02/21/2013     5:14:03 PM   CARE MANAGEMENT NOTE 02/21/2013  Patient:  Joshua Robinson, Joshua Robinson   Account Number:  [id number]  Date Initiated:  02/20/2013  Documentation initiated by:  Colleen Can  Subjective/Objective Assessment:   DX: OA RT KNEE; TOTAL KNEE REPLACEMNT  PCP:Wm  McGough  Has prescription coverage  Used [facility] in [place]     Action/Plan:   CM spoke with patient & spouse. Plans are for patient to reyrn to his home in [place] where spouse will be caregiver. Has acess to walker> states will not need commode seat d/t has high toilet seat in bathrom   Anticipated DC Date:  02/22/2013   Anticipated DC Plan:  HOME W HOME HEALTH SERVICES      DC Planning Services  CM consult      [facility] Choice  HOME HEALTH   Choice offered to / List presented to:  C-1 Patient        HH arranged  HH-2 PT      [facility] agency  [facility]   Status of service:  Completed, signed off Medicare Important Message given?  NA - LOS <3 / Initial given by admissions (If response is "NO", the following Medicare IM given date fields will be blank) Date Medicare IM given:   Date Additional Medicare IM given:    Discharge Disposition:  HOME W HOME HEALTH SERVICES  Per UR Regulation:    If discussed at Long Length of Stay Meetings, dates discussed:    Comments:  02/21/2013 Colleen Can BSN RN CCM [phone] Pt discharged today with [facility] services in place. [facility] will start services tomorrow 02/22/2013.  02/20/2013 Colleen Can BSN RN CCM [phone] Tct [facility] rep who advised that they could provide H services upon pt's discharge. CM will follow.

## 2013-02-21 LAB — CBC
HCT: 33.6 % — ABNORMAL LOW (ref 39.0–52.0)
MCV: 94.6 fL (ref 78.0–100.0)
Platelets: 218 10*3/uL (ref 150–400)
RBC: 3.55 MIL/uL — ABNORMAL LOW (ref 4.22–5.81)
WBC: 12.7 10*3/uL — ABNORMAL HIGH (ref 4.0–10.5)

## 2013-02-21 LAB — BASIC METABOLIC PANEL
CO2: 26 mEq/L (ref 19–32)
Chloride: 99 mEq/L (ref 96–112)
Creatinine, Ser: 0.65 mg/dL (ref 0.50–1.35)
Potassium: 3.7 mEq/L (ref 3.5–5.1)

## 2013-02-21 MED ORDER — RIVAROXABAN 10 MG PO TABS: 10.0000 mg | ORAL_TABLET | Freq: Every day | ORAL | Status: DC

## 2013-02-21 MED ORDER — OXYCODONE HCL 5 MG PO TABS: 5.0000 mg | ORAL_TABLET | ORAL | Status: DC | PRN

## 2013-02-21 MED ORDER — TRAMADOL HCL 50 MG PO TABS: 50.0000 mg | ORAL_TABLET | Freq: Four times a day (QID) | ORAL | Status: DC | PRN

## 2013-02-21 MED ORDER — METHOCARBAMOL 500 MG PO TABS: 500.0000 mg | ORAL_TABLET | Freq: Four times a day (QID) | ORAL | Status: DC | PRN

## 2013-02-21 NOTE — Evaluation (Signed)
Occupational Therapy Evaluation Patient Details Name: Joshua Robinson MRN: [id number] DOB: 07-01-1934 Today's Date: 02/21/2013 Time: 0865-7846 OT Time Calculation (min): 13 min  OT Assessment / Plan / Recommendation History of present illness RTKA on 02/19/13   Clinical Impression   Pt ptresents to OT s/p TKR. All education completed regarding ADL activity     OT Assessment  Patient does not need any further OT services    Follow Up Recommendations  No OT follow up       Equipment Recommendations  None recommended by OT          Precautions / Restrictions Restrictions Weight Bearing Restrictions: No       ADL  Lower Body Dressing: Min guard Where Assessed - Lower Body Dressing: Unsupported sit to stand Toilet Transfer: Supervision/safety Toilet Transfer Equipment: Comfort height toilet Tub/Shower Transfer: Supervision/safety Tub/Shower Transfer Method: Science writer: Walk in Scientist, research (physical sciences) Used: Rolling walker Transfers/Ambulation Related to ADLs: Edcuated pt on ADL activity s/p TKR. Pt and wife able to verbalize understanding of strategies to increase I with ADL activity specifically LB dressing and shower transfer        Visit Information  Last OT Received On: 02/21/13 Assistance Needed: +1 History of Present Illness: RTKA on 02/19/13       Prior Functioning     Home Living Family/patient expects to be discharged to:: Private residence Living Arrangements: Spouse/significant other Available Help at Discharge: Family Type of Home: House Home Access: Stairs to enter Secretary/administrator of Steps: 1 Home Layout: One level Home Equipment: Environmental consultant - standard;Cane - single point Prior Function Level of Independence: Independent Communication Communication: No difficulties         Vision/Perception Vision - History Patient Visual Report: No change from baseline   Cognition  Cognition Arousal/Alertness:  Awake/alert Behavior During Therapy: WFL for tasks assessed/performed Overall Cognitive Status: Within Functional Limits for tasks assessed    Extremity/Trunk Assessment Upper Extremity Assessment Upper Extremity Assessment: Overall WFL for tasks assessed     Mobility Bed Mobility Bed Mobility: Not assessed Transfers Transfers: Sit to Stand;Stand to Sit Sit to Stand: 5: Supervision;From bed;From chair/3-in-1;From toilet;With armrests Stand to Sit: 5: Supervision;To toilet;To chair/3-in-1;With armrests           End of Session OT - End of Session Activity Tolerance: Patient tolerated treatment well Patient left: in chair;with family/visitor present  GO     Joshua Robinson 02/21/2013, 9:49 AM

## 2013-02-21 NOTE — Consult Note (Signed)
Spoke to wife and she advised me that patient already has walker at home with wheels.

## 2013-02-21 NOTE — Discharge Summary (Signed)
Physician Discharge Summary   Patient ID: Joshua Robinson MRN: [id number] DOB/AGE: January 28, 1935 78 y.o.  Admit date: 02/19/2013 Discharge date: 02/21/2013  Primary Diagnosis:  Osteoarthritis Right knee(s)  Admission Diagnoses:  Past Medical History  Diagnosis Date  . Cancer     skin  . Arthritis    Discharge Diagnoses:   Principal Problem:   OA (osteoarthritis) of knee Active Problems:   Postoperative anemia due to acute blood loss   Hyponatremia  Estimated body mass index is 25.7 kg/(m^2) as calculated from the following:   Height as of this encounter: 5\' 8"  (1.727 m).   Weight as of this encounter: 76.658 kg (169 lb).  Procedure:  Procedure(s) (LRB): RIGHT TOTAL KNEE ARTHROPLASTY (Right)   Consults: None  HPI: Joshua Robinson is a 78 y.o. year old male with end stage OA of his right knee with progressively worsening pain and dysfunction. He has constant pain, with activity and at rest and significant functional deficits with difficulties even with ADLs. He has had extensive non-op management including analgesics, injections of cortisone and viscosupplements, and home exercise program, but remains in significant pain with significant dysfunction. Radiographs show bone on bone arthritis medial and patellofemoral with varus deformity. He presents now for right Total Knee Arthroplasty.   Laboratory Data: Admission on 02/19/2013, Discharged on 02/21/2013  Component Date Value Range Status  . ABO/RH(D) 02/12/2013 A POS   Final  . WBC 02/20/2013 12.6* 4.0 - 10.5 K/uL Final  . RBC 02/20/2013 3.64* 4.22 - 5.81 MIL/uL Final  . Hemoglobin 02/20/2013 12.7* 13.0 - 17.0 g/dL Final  . HCT 66/44/0347 34.5* 39.0 - 52.0 % Final  . MCV 02/20/2013 94.8  78.0 - 100.0 fL Final  . MCH 02/20/2013 34.9* 26.0 - 34.0 pg Final  . MCHC 02/20/2013 36.8* 30.0 - 36.0 g/dL Final  . RDW 42/59/5638 13.1  11.5 - 15.5 % Final  . Platelets 02/20/2013 221  150 - 400 K/uL Final  . Sodium 02/20/2013 134*  135 - 145 mEq/L Final  . Potassium 02/20/2013 3.7  3.5 - 5.1 mEq/L Final  . Chloride 02/20/2013 100  96 - 112 mEq/L Final  . CO2 02/20/2013 25  19 - 32 mEq/L Final  . Glucose, Bld 02/20/2013 128* 70 - 99 mg/dL Final  . BUN 75/64/3329 13  6 - 23 mg/dL Final  . Creatinine, Ser 02/20/2013 0.76  0.50 - 1.35 mg/dL Final  . Calcium 51/88/4166 8.1* 8.4 - 10.5 mg/dL Final  . GFR calc non Af Amer 02/20/2013 86* >90 mL/min Final  . GFR calc Af Amer 02/20/2013 >90  >90 mL/min Final   Comment: (NOTE)                          The eGFR has been calculated using the CKD EPI equation.                          This calculation has not been validated in all clinical situations.                          eGFR's persistently <90 mL/min signify possible Chronic Kidney                          Disease.  . WBC 02/21/2013 12.7* 4.0 - 10.5 K/uL Final  . RBC 02/21/2013 3.55* 4.22 - 5.81 MIL/uL  Final  . Hemoglobin 02/21/2013 12.1* 13.0 - 17.0 g/dL Final  . HCT 29/56/2130 33.6* 39.0 - 52.0 % Final  . MCV 02/21/2013 94.6  78.0 - 100.0 fL Final  . MCH 02/21/2013 34.1* 26.0 - 34.0 pg Final  . MCHC 02/21/2013 36.0  30.0 - 36.0 g/dL Final  . RDW 86/57/8469 13.3  11.5 - 15.5 % Final  . Platelets 02/21/2013 218  150 - 400 K/uL Final  . Sodium 02/21/2013 135  135 - 145 mEq/L Final  . Potassium 02/21/2013 3.7  3.5 - 5.1 mEq/L Final  . Chloride 02/21/2013 99  96 - 112 mEq/L Final  . CO2 02/21/2013 26  19 - 32 mEq/L Final  . Glucose, Bld 02/21/2013 121* 70 - 99 mg/dL Final  . BUN 62/95/2841 13  6 - 23 mg/dL Final  . Creatinine, Ser 02/21/2013 0.65  0.50 - 1.35 mg/dL Final  . Calcium 32/44/0102 8.3* 8.4 - 10.5 mg/dL Final  . GFR calc non Af Amer 02/21/2013 >90  >90 mL/min Final  . GFR calc Af Amer 02/21/2013 >90  >90 mL/min Final   Comment: (NOTE)                          The eGFR has been calculated using the CKD EPI equation.                          This calculation has not been validated in all clinical situations.                            eGFR's persistently <90 mL/min signify possible Chronic Kidney                          Disease.  Hospital Outpatient Visit on 02/12/2013  Component Date Value Range Status  . MRSA, PCR 02/12/2013 NEGATIVE  NEGATIVE Final  . Staphylococcus aureus 02/12/2013 NEGATIVE  NEGATIVE Final   Comment:                                 The Xpert SA Assay (FDA                          approved for NASAL specimens                          in patients over 73 years of age),                          is one component of                          a comprehensive surveillance                          program.  Test performance has                          been validated by [employer] for patients greater  than or equal to 57 year old.                          It is not intended                          to diagnose infection nor to                          guide or monitor treatment.                          Performed at [facility]  . aPTT 02/12/2013 43* 24 - 37 seconds Final   Comment:                                 IF BASELINE aPTT IS ELEVATED,                          SUGGEST PATIENT RISK ASSESSMENT                          BE USED TO DETERMINE APPROPRIATE                          ANTICOAGULANT THERAPY.  . WBC 02/12/2013 7.3  4.0 - 10.5 K/uL Final  . RBC 02/12/2013 4.63  4.22 - 5.81 MIL/uL Final  . Hemoglobin 02/12/2013 15.7  13.0 - 17.0 g/dL Final  . HCT 16/02/9603 44.0  39.0 - 52.0 % Final  . MCV 02/12/2013 95.0  78.0 - 100.0 fL Final  . MCH 02/12/2013 33.9  26.0 - 34.0 pg Final  . MCHC 02/12/2013 35.7  30.0 - 36.0 g/dL Final  . RDW 54/01/8118 13.1  11.5 - 15.5 % Final  . Platelets 02/12/2013 243  150 - 400 K/uL Final  . Sodium 02/12/2013 138  135 - 145 mEq/L Final  . Potassium 02/12/2013 4.3  3.5 - 5.1 mEq/L Final  . Chloride 02/12/2013 99  96 - 112 mEq/L Final  . CO2 02/12/2013 29  19 - 32 mEq/L Final  .  Glucose, Bld 02/12/2013 100* 70 - 99 mg/dL Final  . BUN 14/78/2956 17  6 - 23 mg/dL Final  . Creatinine, Ser 02/12/2013 0.81  0.50 - 1.35 mg/dL Final  . Calcium 21/30/8657 9.5  8.4 - 10.5 mg/dL Final  . Total Protein 02/12/2013 7.3  6.0 - 8.3 g/dL Final  . Albumin 84/69/6295 4.1  3.5 - 5.2 g/dL Final  . AST 28/41/3244 23  0 - 37 U/L Final  . ALT 02/12/2013 17  0 - 53 U/L Final  . Alkaline Phosphatase 02/12/2013 61  39 - 117 U/L Final  . Total Bilirubin 02/12/2013 0.7  0.3 - 1.2 mg/dL Final  . GFR calc non Af Amer 02/12/2013 84* >90 mL/min Final  . GFR calc Af Amer 02/12/2013 >90  >90 mL/min Final   Comment: (NOTE)                          The eGFR has been calculated using the CKD EPI equation.  This calculation has not been validated in all clinical situations.                          eGFR's persistently <90 mL/min signify possible Chronic Kidney                          Disease.  Marland Kitchen Prothrombin Time 02/12/2013 12.5  11.6 - 15.2 seconds Final  . INR 02/12/2013 0.95  0.00 - 1.49 Final  . Color, Urine 02/12/2013 YELLOW  YELLOW Final  . APPearance 02/12/2013 CLEAR  CLEAR Final  . Specific Gravity, Urine 02/12/2013 1.011  1.005 - 1.030 Final  . pH 02/12/2013 7.0  5.0 - 8.0 Final  . Glucose, UA 02/12/2013 NEGATIVE  NEGATIVE mg/dL Final  . Hgb urine dipstick 02/12/2013 NEGATIVE  NEGATIVE Final  . Bilirubin Urine 02/12/2013 NEGATIVE  NEGATIVE Final  . Ketones, ur 02/12/2013 NEGATIVE  NEGATIVE mg/dL Final  . Protein, ur 03/47/4259 NEGATIVE  NEGATIVE mg/dL Final  . Urobilinogen, UA 02/12/2013 0.2  0.0 - 1.0 mg/dL Final  . Nitrite 56/38/7564 NEGATIVE  NEGATIVE Final  . Leukocytes, UA 02/12/2013 NEGATIVE  NEGATIVE Final   MICROSCOPIC NOT DONE ON URINES WITH NEGATIVE PROTEIN, BLOOD, LEUKOCYTES, NITRITE, OR GLUCOSE <1000 mg/dL.  . ABO/RH(D) 02/12/2013 A POS   Final  . Antibody Screen 02/12/2013 NEG   Final  . Sample Expiration 02/12/2013 02/22/2013   Final      X-Rays:Dg Chest 2 View  02/12/2013   CLINICAL DATA:  Preoperative evaluation for right total knee arthroplasty, history smoking  EXAM: CHEST  2 VIEW  COMPARISON:  None  FINDINGS: Upper normal heart size.  Tortuous aorta.  Pulmonary vascularity normal.  Lungs mildly hyperinflated but clear.  No infiltrate, pleural effusion, or pneumothorax.  Bones mildly demineralized.  IMPRESSION: Mildly hyperinflated lungs without acute infiltrate.   Electronically Signed   By: Ulyses Southward M.D.   On: 02/12/2013 14:19    EKG: Orders placed during the hospital encounter of 02/19/13  . EKG     Hospital Course: Joshua Robinson is a 78 y.o. who was admitted to [facility] They were brought to the operating room on 02/19/2013 and underwent Procedure(s): RIGHT TOTAL KNEE ARTHROPLASTY.  Patient tolerated the procedure well and was later transferred to the recovery room and then to the orthopaedic floor for postoperative care.  They were given PO and IV analgesics for pain control following their surgery.  They were given 24 hours of postoperative antibiotics of  Anti-infectives   Start     Dose/Rate Route Frequency Ordered Stop   02/19/13 1700  ceFAZolin (ANCEF) IVPB 1 g/50 mL premix     1 g 100 mL/hr over 30 Minutes Intravenous Every 6 hours 02/19/13 1317 02/20/13 0011   02/19/13 0800  ceFAZolin (ANCEF) IVPB 2 g/50 mL premix     2 g 100 mL/hr over 30 Minutes Intravenous On call to O.R. 02/19/13 3329 02/19/13 1024     and started on DVT prophylaxis in the form of Xarelto.   PT and OT were ordered for total joint protocol.  Discharge planning consulted to help with postop disposition and equipment needs.  Patient had a decent night on the evening of surgery.  They started to get up OOB with therapy on day one. Hemovac drain was pulled without difficulty.  Continued to work with therapy into day two.  Dressing was changed on day two and the  incision was healing well.  Patient was seen in rounds and was  ready to go home.  Discharge Medications: Prior to Admission medications   Medication Sig Start Date End Date Taking? Authorizing Provider  methocarbamol (ROBAXIN) 500 MG tablet Take 1 tablet (500 mg total) by mouth every 6 (six) hours as needed. 02/21/13   Alexzandrew Perkins, PA-C  oxyCODONE (OXY IR/ROXICODONE) 5 MG immediate release tablet Take 1-2 tablets (5-10 mg total) by mouth every 3 (three) hours as needed. 02/21/13   Alexzandrew Perkins, PA-C  polyvinyl alcohol (LIQUIFILM TEARS) 1.4 % ophthalmic solution Place 1 drop into both eyes daily as needed (dry eyes).    Historical Provider, MD  rivaroxaban (XARELTO) 10 MG TABS tablet Take 1 tablet (10 mg total) by mouth daily with breakfast. Take Xarelto for two and a half more weeks, then discontinue Xarelto. Once the patient has completed the Xarelto, they may resume the 81 mg Aspirin. 02/21/13   Alexzandrew Perkins, PA-C  traMADol (ULTRAM) 50 MG tablet Take 1-2 tablets (50-100 mg total) by mouth every 6 (six) hours as needed (mild pain). 02/21/13   Alexzandrew Julien Girt, PA-C   Discharge home with home health  Diet - Regular diet  Follow up - in 2 weeks  Activity - WBAT  Disposition - Home  Condition Upon Discharge - Good  D/C Meds - See DC Summary  DVT Prophylaxis - Xarelto      Discharge Orders   Future Orders Complete By Expires   Call MD / Call 911  As directed    Comments:     If you experience chest pain or shortness of breath, CALL 911 and be transported to the hospital emergency room.  If you develope a fever above 101 F, pus (white drainage) or increased drainage or redness at the wound, or calf pain, call your surgeon's office.   Change dressing  As directed    Comments:     Change dressing daily with sterile 4 x 4 inch gauze dressing and apply TED hose. Do not submerge the incision under water.   Constipation Prevention  As directed    Comments:     Drink plenty of fluids.  Prune juice may be helpful.  You may use a stool  softener, such as Colace (over the counter) 100 mg twice a day.  Use MiraLax (over the counter) for constipation as needed.   Diet general  As directed    Discharge instructions  As directed    Comments:     Pick up stool softner and laxative for home. Do not submerge incision under water. May shower starting Thursday 02/22/2013 Continue to use ice for pain and swelling from surgery.  Take Xarelto for two and a half more weeks, then discontinue Xarelto. Once the patient has completed the Xarelto, they may resume the 81 mg Aspirin.   Do not put a pillow under the knee. Place it under the heel.  As directed    Do not sit on low chairs, stoools or toilet seats, as it may be difficult to get up from low surfaces  As directed    Driving restrictions  As directed    Comments:     No driving until released by the physician.   Increase activity slowly as tolerated  As directed    Lifting restrictions  As directed    Comments:     No lifting until released by the physician.   Patient may shower  As directed    Comments:  You may shower without a dressing once there is no drainage.  Do not wash over the wound.  If drainage remains, do not shower until drainage stops.   TED hose  As directed    Comments:     Use stockings (TED hose) for 3 weeks on both leg(s).  You may remove them at night for sleeping.   Weight bearing as tolerated  As directed    Questions:     Laterality:     Extremity:         Medication List    STOP taking these medications       fish oil-omega-3 fatty acids 1000 MG capsule     multivitamin with minerals Tabs tablet     naproxen sodium 220 MG tablet  Commonly known as:  ANAPROX      TAKE these medications       methocarbamol 500 MG tablet  Commonly known as:  ROBAXIN  Take 1 tablet (500 mg total) by mouth every 6 (six) hours as needed.     oxyCODONE 5 MG immediate release tablet  Commonly known as:  Oxy IR/ROXICODONE  Take 1-2 tablets (5-10 mg total)  by mouth every 3 (three) hours as needed.     polyvinyl alcohol 1.4 % ophthalmic solution  Commonly known as:  LIQUIFILM TEARS  Place 1 drop into both eyes daily as needed (dry eyes).     rivaroxaban 10 MG Tabs tablet  Commonly known as:  XARELTO  - Take 1 tablet (10 mg total) by mouth daily with breakfast. Take Xarelto for two and a half more weeks, then discontinue Xarelto.  - Once the patient has completed the Xarelto, they may resume the 81 mg Aspirin.     traMADol 50 MG tablet  Commonly known as:  ULTRAM  Take 1-2 tablets (50-100 mg total) by mouth every 6 (six) hours as needed (mild pain).       Follow-up Information   Follow up with Loanne Drilling, MD. Schedule an appointment as soon as possible for a visit in 2 weeks.   Specialty:  Orthopedic Surgery   Contact information:   [address] [place] [address] [phone]       Signed: [name] 03/02/2013, 9:28 AM

## 2013-02-21 NOTE — Progress Notes (Signed)
Physical Therapy Treatment Patient Details Name: Joshua Robinson MRN: [id number] DOB: 10-Jul-1934 Today's Date: 02/21/2013 Time: 0812-0830 PT Time Calculation (min): 18 min  PT Assessment / Plan / Recommendation  History of Present Illness RTKA on 02/19/13   PT Comments   Pt has not had pain meds . Pt plans for DC this day after practice steps.  Follow Up Recommendations  Home health PT     Does the patient have the potential to tolerate intense rehabilitation     Barriers to Discharge        Equipment Recommendations  None recommended by PT    Recommendations for Other Services    Frequency 7X/week   Progress towards PT Goals Progress towards PT goals: Progressing toward goals  Plan Current plan remains appropriate    Precautions / Restrictions Precautions Precautions: Fall Restrictions Weight Bearing Restrictions: No   Pertinent Vitals/Pain 9 /10 ice applied/    Mobility  Bed Mobility Bed Mobility: Not assessed Transfers Sit to Stand: 5: Supervision;From bed;With armrests Stand to Sit: 5: Supervision;To chair/3-in-1;With armrests Details for Transfer Assistance: cues for use of UE's and for RLE position. Ambulation/Gait Ambulation/Gait Assistance: 4: Min guard Ambulation Distance (Feet): 20 Feet Assistive device: Rolling walker Ambulation/Gait Assistance Details: Frequent cues for safe use of SW, sequence. Pt needs pain meds so will practicesteps later. Gait Pattern: Step-to pattern;Antalgic    Exercises     PT Diagnosis:    PT Problem List:   PT Treatment Interventions:     PT Goals (current goals can now be found in the care plan section)    Visit Information  Last PT Received On: 02/21/13 Assistance Needed: +1 History of Present Illness: RTKA on 02/19/13    Subjective Data      Cognition  Cognition Arousal/Alertness: Awake/alert Behavior During Therapy: WFL for tasks assessed/performed Overall Cognitive Status: Within Functional Limits for  tasks assessed    Balance     End of Session PT - End of Session Activity Tolerance: Patient limited by pain Patient left: in chair;with family/visitor present Nurse Communication: Mobility status;Patient requests pain meds CPM Right Knee CPM Right Knee: Off   GP     Rada Hay 02/21/2013, 10:47 AM

## 2013-02-21 NOTE — Progress Notes (Signed)
   Subjective: 2 Days Post-Op Procedure(s) (LRB): RIGHT TOTAL KNEE ARTHROPLASTY (Right) Patient reports pain as mild.   Patient seen in rounds for Dr. Lequita Halt. Patient is well, and has had no acute complaints or problems Patient is ready to go home  Objective: Vital signs in last 24 hours: Temp:  [98 F (36.7 C)-98.7 F (37.1 C)] 98.7 F (37.1 C) (10/08 0322) Pulse Rate:  [65-88] 65 (10/08 0322) Resp:  [16-18] 16 (10/08 0322) BP: (117-131)/(67-80) 119/80 mmHg (10/08 0322) SpO2:  [96 %-99 %] 96 % (10/08 0322)  Intake/Output from previous day:  Intake/Output Summary (Last 24 hours) at 02/21/13 0749 Last data filed at 02/20/13 1730  Gross per 24 hour  Intake 778.75 ml  Output    200 ml  Net 578.75 ml    Intake/Output this shift:    Labs:  Recent Labs  02/20/13 0410 02/21/13 0400  HGB 12.7* 12.1*    Recent Labs  02/20/13 0410 02/21/13 0400  WBC 12.6* 12.7*  RBC 3.64* 3.55*  HCT 34.5* 33.6*  PLT 221 218    Recent Labs  02/20/13 0410 02/21/13 0400  NA 134* 135  K 3.7 3.7  CL 100 99  CO2 25 26  BUN 13 13  CREATININE 0.76 0.65  GLUCOSE 128* 121*  CALCIUM 8.1* 8.3*   No results found for this basename: LABPT, INR,  in the last 72 hours  EXAM: General - Patient is Alert, Appropriate and Oriented Extremity - Neurovascular intact Sensation intact distally Dorsiflexion/Plantar flexion intact Incision - clean, dry, no drainage, healing Motor Function - intact, moving foot and toes well on exam.   Assessment/Plan: 2 Days Post-Op Procedure(s) (LRB): RIGHT TOTAL KNEE ARTHROPLASTY (Right) Procedure(s) (LRB): RIGHT TOTAL KNEE ARTHROPLASTY (Right) Past Medical History  Diagnosis Date  . Cancer     skin  . Arthritis    Principal Problem:   OA (osteoarthritis) of knee  Estimated body mass index is 25.7 kg/(m^2) as calculated from the following:   Height as of this encounter: 5\' 8"  (1.727 m).   Weight as of this encounter: 76.658 kg (169 lb). Up  with therapy Discharge home with home health Diet - Regular diet Follow up - in 2 weeks Activity - WBAT Disposition - Home Condition Upon Discharge - Good D/C Meds - See DC Summary DVT Prophylaxis - Xarelto  Camelia Stelzner 02/21/2013, 7:49 AM

## 2013-02-21 NOTE — Progress Notes (Signed)
Physical Therapy Treatment Patient Details Name: YASHAR INCLAN MRN: [id number] DOB: 01-16-1935 Today's Date: 02/21/2013 Time: 2536-6440 PT Time Calculation (min): 18 min  PT Assessment / Plan / Recommendation  History of Present Illness RTKA on 02/19/13   PT Comments   Pt and wife have practiced steps and HEP. Ready for DC.  Follow Up Recommendations  Home health PT     Does the patient have the potential to tolerate intense rehabilitation     Barriers to Discharge        Equipment Recommendations  None recommended by PT    Recommendations for Other Services    Frequency 7X/week   Progress towards PT Goals Progress towards PT goals: Progressing toward goals  Plan Current plan remains appropriate    Precautions / Restrictions Precautions Precautions: Fall Restrictions Weight Bearing Restrictions: No   Pertinent Vitals/Pain 4 R knee.    Mobility  Bed Mobility Bed Mobility: Not assessed Transfers Sit to Stand: 5: Supervision;With armrests;From chair/3-in-1 Stand to Sit: 5: Supervision;To chair/3-in-1;With armrests Details for Transfer Assistance: cues for use of UE's and for RLE position. Ambulation/Gait Ambulation/Gait Assistance: 4: Min guard Ambulation Distance (Feet): 150 Feet Assistive device: Standard walker Ambulation/Gait Assistance Details: frequent cues for safe use of SW, tends to place too far out. Gait Pattern: Step-to pattern;Antalgic Stairs: Yes Stairs Assistance: 4: Min guard Stair Management Technique: No rails;With walker;Forwards Number of Stairs: 1    Exercises Total Joint Exercises Quad Sets: AROM;Right;10 reps;Supine Short Arc Quad: AROM;Right;10 reps Heel Slides: AROM;Right;10 reps Hip ABduction/ADduction: AROM;Right;10 reps Straight Leg Raises: AROM;Right;10 reps Long Arc Quad: AROM;Right;10 reps   PT Diagnosis:    PT Problem List:   PT Treatment Interventions:     PT Goals (current goals can now be found in the care plan  section)    Visit Information  Last PT Received On: 02/21/13 Assistance Needed: +1 History of Present Illness: RTKA on 02/19/13    Subjective Data      Cognition  Cognition Arousal/Alertness: Awake/alert Behavior During Therapy: WFL for tasks assessed/performed Overall Cognitive Status: Within Functional Limits for tasks assessed    Balance     End of Session PT - End of Session Activity Tolerance: Patient tolerated treatment well Patient left: in chair;with family/visitor present Nurse Communication: Mobility status;Patient requests pain meds CPM Right Knee CPM Right Knee: Off   GP     Rada Hay 02/21/2013, 10:51 AM

## 2013-03-02 DIAGNOSIS — E871 Hypo-osmolality and hyponatremia: Secondary | ICD-10-CM

## 2013-03-02 DIAGNOSIS — D62 Acute posthemorrhagic anemia: Secondary | ICD-10-CM

## 2013-03-08 ENCOUNTER — Ambulatory Visit: Payer: Medicare Other | Attending: Orthopedic Surgery

## 2013-03-08 DIAGNOSIS — M25569 Pain in unspecified knee: Secondary | ICD-10-CM | POA: Insufficient documentation

## 2013-03-08 DIAGNOSIS — Z96659 Presence of unspecified artificial knee joint: Secondary | ICD-10-CM | POA: Insufficient documentation

## 2013-03-08 DIAGNOSIS — IMO0001 Reserved for inherently not codable concepts without codable children: Secondary | ICD-10-CM | POA: Insufficient documentation

## 2013-03-12 ENCOUNTER — Ambulatory Visit: Payer: Medicare Other

## 2013-03-14 ENCOUNTER — Ambulatory Visit: Payer: Medicare Other

## 2013-03-16 ENCOUNTER — Ambulatory Visit: Payer: Medicare Other | Admitting: *Deleted

## 2013-03-19 ENCOUNTER — Ambulatory Visit: Payer: Medicare Other | Attending: Orthopedic Surgery | Admitting: Physical Therapy

## 2013-03-19 DIAGNOSIS — M25569 Pain in unspecified knee: Secondary | ICD-10-CM | POA: Insufficient documentation

## 2013-03-19 DIAGNOSIS — Z96659 Presence of unspecified artificial knee joint: Secondary | ICD-10-CM | POA: Insufficient documentation

## 2013-03-19 DIAGNOSIS — IMO0001 Reserved for inherently not codable concepts without codable children: Secondary | ICD-10-CM | POA: Insufficient documentation

## 2013-03-21 ENCOUNTER — Ambulatory Visit: Payer: Medicare Other | Admitting: Physical Therapy

## 2013-03-23 ENCOUNTER — Ambulatory Visit: Payer: Medicare Other | Admitting: *Deleted

## 2013-03-26 ENCOUNTER — Ambulatory Visit: Payer: Medicare Other | Admitting: Physical Therapy

## 2013-03-28 ENCOUNTER — Ambulatory Visit: Payer: Medicare Other | Admitting: Physical Therapy

## 2013-03-30 ENCOUNTER — Ambulatory Visit: Payer: Medicare Other | Admitting: Physical Therapy

## 2013-04-02 ENCOUNTER — Encounter: Payer: Medicare Other | Admitting: Physical Therapy

## 2013-04-03 ENCOUNTER — Encounter: Payer: Medicare Other | Admitting: Physical Therapy

## 2013-04-04 ENCOUNTER — Encounter: Payer: Medicare Other | Admitting: Physical Therapy

## 2013-04-06 ENCOUNTER — Encounter: Payer: Medicare Other | Admitting: *Deleted

## 2015-08-12 DIAGNOSIS — B354 Tinea corporis: Secondary | ICD-10-CM | POA: Diagnosis not present

## 2015-08-12 DIAGNOSIS — L299 Pruritus, unspecified: Secondary | ICD-10-CM | POA: Diagnosis not present

## 2015-08-12 DIAGNOSIS — Z6827 Body mass index (BMI) 27.0-27.9, adult: Secondary | ICD-10-CM | POA: Diagnosis not present

## 2015-11-20 DIAGNOSIS — Z1389 Encounter for screening for other disorder: Secondary | ICD-10-CM | POA: Diagnosis not present

## 2015-11-20 DIAGNOSIS — E663 Overweight: Secondary | ICD-10-CM | POA: Diagnosis not present

## 2015-11-20 DIAGNOSIS — Z6826 Body mass index (BMI) 26.0-26.9, adult: Secondary | ICD-10-CM | POA: Diagnosis not present

## 2015-11-20 DIAGNOSIS — M1991 Primary osteoarthritis, unspecified site: Secondary | ICD-10-CM | POA: Diagnosis not present

## 2015-11-20 DIAGNOSIS — B354 Tinea corporis: Secondary | ICD-10-CM | POA: Diagnosis not present

## 2015-12-03 DIAGNOSIS — R238 Other skin changes: Secondary | ICD-10-CM | POA: Diagnosis not present

## 2015-12-03 DIAGNOSIS — L120 Bullous pemphigoid: Secondary | ICD-10-CM | POA: Diagnosis not present

## 2017-02-22 DIAGNOSIS — H40013 Open angle with borderline findings, low risk, bilateral: Secondary | ICD-10-CM | POA: Diagnosis not present

## 2017-02-22 DIAGNOSIS — H43813 Vitreous degeneration, bilateral: Secondary | ICD-10-CM | POA: Diagnosis not present

## 2017-02-22 DIAGNOSIS — H524 Presbyopia: Secondary | ICD-10-CM | POA: Diagnosis not present

## 2017-02-22 DIAGNOSIS — H5203 Hypermetropia, bilateral: Secondary | ICD-10-CM | POA: Diagnosis not present

## 2018-03-02 DIAGNOSIS — Z6826 Body mass index (BMI) 26.0-26.9, adult: Secondary | ICD-10-CM | POA: Diagnosis not present

## 2018-03-02 DIAGNOSIS — Z0001 Encounter for general adult medical examination with abnormal findings: Secondary | ICD-10-CM | POA: Diagnosis not present

## 2018-03-02 DIAGNOSIS — E663 Overweight: Secondary | ICD-10-CM | POA: Diagnosis not present

## 2018-03-02 DIAGNOSIS — Z1389 Encounter for screening for other disorder: Secondary | ICD-10-CM | POA: Diagnosis not present

## 2018-03-02 DIAGNOSIS — E782 Mixed hyperlipidemia: Secondary | ICD-10-CM | POA: Diagnosis not present

## 2019-03-14 DIAGNOSIS — Z6826 Body mass index (BMI) 26.0-26.9, adult: Secondary | ICD-10-CM | POA: Diagnosis not present

## 2019-03-14 DIAGNOSIS — Z0001 Encounter for general adult medical examination with abnormal findings: Secondary | ICD-10-CM | POA: Diagnosis not present

## 2019-03-14 DIAGNOSIS — M159 Polyosteoarthritis, unspecified: Secondary | ICD-10-CM | POA: Diagnosis not present

## 2019-03-14 DIAGNOSIS — E663 Overweight: Secondary | ICD-10-CM | POA: Diagnosis not present

## 2019-04-24 ENCOUNTER — Other Ambulatory Visit: Payer: Self-pay | Admitting: *Deleted

## 2019-04-24 DIAGNOSIS — Z20822 Contact with and (suspected) exposure to covid-19: Secondary | ICD-10-CM

## 2019-04-26 ENCOUNTER — Telehealth: Payer: Self-pay

## 2019-04-26 LAB — NOVEL CORONAVIRUS, NAA: SARS-CoV-2, NAA: NOT DETECTED

## 2019-04-26 NOTE — Telephone Encounter (Signed)
Caller given negative result and verbalized understanding  

## 2019-08-28 DIAGNOSIS — Z0120 Encounter for dental examination and cleaning without abnormal findings: Secondary | ICD-10-CM | POA: Diagnosis not present

## 2020-03-26 DIAGNOSIS — M159 Polyosteoarthritis, unspecified: Secondary | ICD-10-CM | POA: Diagnosis not present

## 2020-03-26 DIAGNOSIS — Z0000 Encounter for general adult medical examination without abnormal findings: Secondary | ICD-10-CM | POA: Diagnosis not present

## 2020-03-26 DIAGNOSIS — E663 Overweight: Secondary | ICD-10-CM | POA: Diagnosis not present

## 2020-03-26 DIAGNOSIS — E7849 Other hyperlipidemia: Secondary | ICD-10-CM | POA: Diagnosis not present

## 2020-03-26 DIAGNOSIS — Z1389 Encounter for screening for other disorder: Secondary | ICD-10-CM | POA: Diagnosis not present

## 2020-03-26 DIAGNOSIS — Z6825 Body mass index (BMI) 25.0-25.9, adult: Secondary | ICD-10-CM | POA: Diagnosis not present

## 2020-04-01 DIAGNOSIS — D510 Vitamin B12 deficiency anemia due to intrinsic factor deficiency: Secondary | ICD-10-CM | POA: Diagnosis not present

## 2020-04-01 DIAGNOSIS — E0590 Thyrotoxicosis, unspecified without thyrotoxic crisis or storm: Secondary | ICD-10-CM | POA: Diagnosis not present

## 2020-05-01 DIAGNOSIS — R799 Abnormal finding of blood chemistry, unspecified: Secondary | ICD-10-CM | POA: Diagnosis not present

## 2020-05-29 DIAGNOSIS — M1991 Primary osteoarthritis, unspecified site: Secondary | ICD-10-CM | POA: Diagnosis not present

## 2020-05-29 DIAGNOSIS — U071 COVID-19: Secondary | ICD-10-CM | POA: Diagnosis not present

## 2020-06-01 ENCOUNTER — Inpatient Hospital Stay (HOSPITAL_COMMUNITY)
Admission: EM | Admit: 2020-06-01 | Discharge: 2020-06-20 | DRG: 177 | Disposition: A | Discharge status: Hospice | Payer: Medicare Other | Attending: Internal Medicine | Admitting: Internal Medicine

## 2020-06-01 ENCOUNTER — Emergency Department (HOSPITAL_COMMUNITY): Payer: Medicare Other

## 2020-06-01 ENCOUNTER — Other Ambulatory Visit: Payer: Self-pay

## 2020-06-01 ENCOUNTER — Inpatient Hospital Stay (HOSPITAL_COMMUNITY): Payer: Medicare Other

## 2020-06-01 ENCOUNTER — Encounter (HOSPITAL_COMMUNITY): Payer: Self-pay | Admitting: *Deleted

## 2020-06-01 DIAGNOSIS — Z515 Encounter for palliative care: Secondary | ICD-10-CM | POA: Diagnosis not present

## 2020-06-01 DIAGNOSIS — E538 Deficiency of other specified B group vitamins: Secondary | ICD-10-CM | POA: Diagnosis present

## 2020-06-01 DIAGNOSIS — Z87891 Personal history of nicotine dependence: Secondary | ICD-10-CM

## 2020-06-01 DIAGNOSIS — K828 Other specified diseases of gallbladder: Secondary | ICD-10-CM | POA: Diagnosis present

## 2020-06-01 DIAGNOSIS — G936 Cerebral edema: Secondary | ICD-10-CM | POA: Diagnosis not present

## 2020-06-01 DIAGNOSIS — U071 COVID-19: Secondary | ICD-10-CM | POA: Diagnosis not present

## 2020-06-01 DIAGNOSIS — I63411 Cerebral infarction due to embolism of right middle cerebral artery: Secondary | ICD-10-CM | POA: Diagnosis not present

## 2020-06-01 DIAGNOSIS — I248 Other forms of acute ischemic heart disease: Secondary | ICD-10-CM | POA: Diagnosis not present

## 2020-06-01 DIAGNOSIS — E860 Dehydration: Secondary | ICD-10-CM | POA: Diagnosis present

## 2020-06-01 DIAGNOSIS — D696 Thrombocytopenia, unspecified: Secondary | ICD-10-CM | POA: Diagnosis present

## 2020-06-01 DIAGNOSIS — R29810 Facial weakness: Secondary | ICD-10-CM | POA: Diagnosis not present

## 2020-06-01 DIAGNOSIS — J440 Chronic obstructive pulmonary disease with acute lower respiratory infection: Secondary | ICD-10-CM | POA: Diagnosis present

## 2020-06-01 DIAGNOSIS — J9601 Acute respiratory failure with hypoxia: Secondary | ICD-10-CM | POA: Diagnosis not present

## 2020-06-01 DIAGNOSIS — Z85828 Personal history of other malignant neoplasm of skin: Secondary | ICD-10-CM

## 2020-06-01 DIAGNOSIS — R4701 Aphasia: Secondary | ICD-10-CM | POA: Diagnosis not present

## 2020-06-01 DIAGNOSIS — K5730 Diverticulosis of large intestine without perforation or abscess without bleeding: Secondary | ICD-10-CM | POA: Diagnosis not present

## 2020-06-01 DIAGNOSIS — J069 Acute upper respiratory infection, unspecified: Secondary | ICD-10-CM | POA: Diagnosis not present

## 2020-06-01 DIAGNOSIS — R4182 Altered mental status, unspecified: Secondary | ICD-10-CM | POA: Diagnosis not present

## 2020-06-01 DIAGNOSIS — Z4682 Encounter for fitting and adjustment of non-vascular catheter: Secondary | ICD-10-CM | POA: Diagnosis not present

## 2020-06-01 DIAGNOSIS — B370 Candidal stomatitis: Secondary | ICD-10-CM | POA: Diagnosis not present

## 2020-06-01 DIAGNOSIS — E871 Hypo-osmolality and hyponatremia: Secondary | ICD-10-CM | POA: Diagnosis present

## 2020-06-01 DIAGNOSIS — J3489 Other specified disorders of nose and nasal sinuses: Secondary | ICD-10-CM | POA: Diagnosis not present

## 2020-06-01 DIAGNOSIS — R739 Hyperglycemia, unspecified: Secondary | ICD-10-CM | POA: Diagnosis present

## 2020-06-01 DIAGNOSIS — I639 Cerebral infarction, unspecified: Secondary | ICD-10-CM | POA: Diagnosis not present

## 2020-06-01 DIAGNOSIS — Z8249 Family history of ischemic heart disease and other diseases of the circulatory system: Secondary | ICD-10-CM

## 2020-06-01 DIAGNOSIS — I340 Nonrheumatic mitral (valve) insufficiency: Secondary | ICD-10-CM | POA: Diagnosis not present

## 2020-06-01 DIAGNOSIS — R471 Dysarthria and anarthria: Secondary | ICD-10-CM | POA: Diagnosis not present

## 2020-06-01 DIAGNOSIS — E869 Volume depletion, unspecified: Secondary | ICD-10-CM | POA: Diagnosis present

## 2020-06-01 DIAGNOSIS — R1312 Dysphagia, oropharyngeal phase: Secondary | ICD-10-CM | POA: Diagnosis not present

## 2020-06-01 DIAGNOSIS — Z7901 Long term (current) use of anticoagulants: Secondary | ICD-10-CM

## 2020-06-01 DIAGNOSIS — Z66 Do not resuscitate: Secondary | ICD-10-CM | POA: Diagnosis not present

## 2020-06-01 DIAGNOSIS — E785 Hyperlipidemia, unspecified: Secondary | ICD-10-CM | POA: Diagnosis present

## 2020-06-01 DIAGNOSIS — R0902 Hypoxemia: Secondary | ICD-10-CM

## 2020-06-01 DIAGNOSIS — R0602 Shortness of breath: Secondary | ICD-10-CM | POA: Diagnosis not present

## 2020-06-01 DIAGNOSIS — L89326 Pressure-induced deep tissue damage of left buttock: Secondary | ICD-10-CM | POA: Diagnosis not present

## 2020-06-01 DIAGNOSIS — G9341 Metabolic encephalopathy: Secondary | ICD-10-CM | POA: Diagnosis not present

## 2020-06-01 DIAGNOSIS — J690 Pneumonitis due to inhalation of food and vomit: Secondary | ICD-10-CM | POA: Diagnosis not present

## 2020-06-01 DIAGNOSIS — Z7189 Other specified counseling: Secondary | ICD-10-CM | POA: Diagnosis not present

## 2020-06-01 DIAGNOSIS — I714 Abdominal aortic aneurysm, without rupture: Secondary | ICD-10-CM | POA: Diagnosis present

## 2020-06-01 DIAGNOSIS — D7589 Other specified diseases of blood and blood-forming organs: Secondary | ICD-10-CM | POA: Diagnosis present

## 2020-06-01 DIAGNOSIS — J1282 Pneumonia due to coronavirus disease 2019: Secondary | ICD-10-CM | POA: Diagnosis present

## 2020-06-01 DIAGNOSIS — Z0189 Encounter for other specified special examinations: Secondary | ICD-10-CM

## 2020-06-01 DIAGNOSIS — R7401 Elevation of levels of liver transaminase levels: Secondary | ICD-10-CM | POA: Diagnosis not present

## 2020-06-01 DIAGNOSIS — J189 Pneumonia, unspecified organism: Secondary | ICD-10-CM | POA: Diagnosis not present

## 2020-06-01 DIAGNOSIS — R197 Diarrhea, unspecified: Secondary | ICD-10-CM | POA: Diagnosis present

## 2020-06-01 DIAGNOSIS — G8194 Hemiplegia, unspecified affecting left nondominant side: Secondary | ICD-10-CM | POA: Diagnosis not present

## 2020-06-01 DIAGNOSIS — K8020 Calculus of gallbladder without cholecystitis without obstruction: Secondary | ICD-10-CM | POA: Diagnosis not present

## 2020-06-01 DIAGNOSIS — Z96651 Presence of right artificial knee joint: Secondary | ICD-10-CM | POA: Diagnosis present

## 2020-06-01 DIAGNOSIS — Q279 Congenital malformation of peripheral vascular system, unspecified: Secondary | ICD-10-CM | POA: Diagnosis not present

## 2020-06-01 DIAGNOSIS — D688 Other specified coagulation defects: Secondary | ICD-10-CM | POA: Diagnosis present

## 2020-06-01 DIAGNOSIS — R0989 Other specified symptoms and signs involving the circulatory and respiratory systems: Secondary | ICD-10-CM | POA: Diagnosis not present

## 2020-06-01 DIAGNOSIS — I6523 Occlusion and stenosis of bilateral carotid arteries: Secondary | ICD-10-CM | POA: Diagnosis not present

## 2020-06-01 DIAGNOSIS — Q283 Other malformations of cerebral vessels: Secondary | ICD-10-CM | POA: Diagnosis not present

## 2020-06-01 DIAGNOSIS — I63511 Cerebral infarction due to unspecified occlusion or stenosis of right middle cerebral artery: Secondary | ICD-10-CM | POA: Diagnosis not present

## 2020-06-01 DIAGNOSIS — R29898 Other symptoms and signs involving the musculoskeletal system: Secondary | ICD-10-CM | POA: Diagnosis not present

## 2020-06-01 DIAGNOSIS — I361 Nonrheumatic tricuspid (valve) insufficiency: Secondary | ICD-10-CM | POA: Diagnosis not present

## 2020-06-01 DIAGNOSIS — L89316 Pressure-induced deep tissue damage of right buttock: Secondary | ICD-10-CM | POA: Diagnosis not present

## 2020-06-01 DIAGNOSIS — I6340 Cerebral infarction due to embolism of unspecified cerebral artery: Secondary | ICD-10-CM | POA: Insufficient documentation

## 2020-06-01 DIAGNOSIS — I4891 Unspecified atrial fibrillation: Secondary | ICD-10-CM | POA: Diagnosis not present

## 2020-06-01 DIAGNOSIS — R1310 Dysphagia, unspecified: Secondary | ICD-10-CM | POA: Diagnosis not present

## 2020-06-01 DIAGNOSIS — R451 Restlessness and agitation: Secondary | ICD-10-CM | POA: Diagnosis not present

## 2020-06-01 DIAGNOSIS — I723 Aneurysm of iliac artery: Secondary | ICD-10-CM | POA: Diagnosis not present

## 2020-06-01 DIAGNOSIS — M419 Scoliosis, unspecified: Secondary | ICD-10-CM | POA: Diagnosis not present

## 2020-06-01 DIAGNOSIS — Z833 Family history of diabetes mellitus: Secondary | ICD-10-CM

## 2020-06-01 DIAGNOSIS — Z781 Physical restraint status: Secondary | ICD-10-CM

## 2020-06-01 LAB — CBC WITH DIFFERENTIAL/PLATELET
Abs Immature Granulocytes: 0.38 10*3/uL — ABNORMAL HIGH (ref 0.00–0.07)
Basophils Absolute: 0.1 10*3/uL (ref 0.0–0.1)
Basophils Relative: 0 %
Eosinophils Absolute: 0 10*3/uL (ref 0.0–0.5)
Eosinophils Relative: 0 %
HCT: 40.8 % (ref 39.0–52.0)
Hemoglobin: 14.2 g/dL (ref 13.0–17.0)
Immature Granulocytes: 2 %
Lymphocytes Relative: 3 %
Lymphs Abs: 0.6 10*3/uL — ABNORMAL LOW (ref 0.7–4.0)
MCH: 33.8 pg (ref 26.0–34.0)
MCHC: 34.8 g/dL (ref 30.0–36.0)
MCV: 97.1 fL (ref 80.0–100.0)
Monocytes Absolute: 1.3 10*3/uL — ABNORMAL HIGH (ref 0.1–1.0)
Monocytes Relative: 6 %
Neutro Abs: 19.9 10*3/uL — ABNORMAL HIGH (ref 1.7–7.7)
Neutrophils Relative %: 89 %
Platelets: 368 10*3/uL (ref 150–400)
RBC: 4.2 MIL/uL — ABNORMAL LOW (ref 4.22–5.81)
RDW: 13.2 % (ref 11.5–15.5)
WBC: 22.3 10*3/uL — ABNORMAL HIGH (ref 4.0–10.5)
nRBC: 0 % (ref 0.0–0.2)

## 2020-06-01 LAB — TROPONIN I (HIGH SENSITIVITY)
Troponin I (High Sensitivity): 337 ng/L (ref <18)
Troponin I (High Sensitivity): 400 ng/L (ref <18)

## 2020-06-01 LAB — URINALYSIS, ROUTINE W REFLEX MICROSCOPIC
Bilirubin Urine: NEGATIVE
Glucose, UA: NEGATIVE mg/dL
Hgb urine dipstick: NEGATIVE
Ketones, ur: 5 mg/dL — AB
Leukocytes,Ua: NEGATIVE
Nitrite: NEGATIVE
Protein, ur: 100 mg/dL — AB
Specific Gravity, Urine: 1.026 (ref 1.005–1.030)
pH: 5 (ref 5.0–8.0)

## 2020-06-01 LAB — COMPREHENSIVE METABOLIC PANEL
ALT: 41 U/L (ref 0–44)
AST: 38 U/L (ref 15–41)
Albumin: 3.1 g/dL — ABNORMAL LOW (ref 3.5–5.0)
Alkaline Phosphatase: 124 U/L (ref 38–126)
Anion gap: 11 (ref 5–15)
BUN: 22 mg/dL (ref 8–23)
CO2: 23 mmol/L (ref 22–32)
Calcium: 8.6 mg/dL — ABNORMAL LOW (ref 8.9–10.3)
Chloride: 98 mmol/L (ref 98–111)
Creatinine, Ser: 0.98 mg/dL (ref 0.61–1.24)
GFR, Estimated: >60 mL/min (ref >60)
Glucose, Bld: 127 mg/dL — ABNORMAL HIGH (ref 70–99)
Potassium: 4 mmol/L (ref 3.5–5.1)
Sodium: 132 mmol/L — ABNORMAL LOW (ref 135–145)
Total Bilirubin: 0.8 mg/dL (ref 0.3–1.2)
Total Protein: 7.7 g/dL (ref 6.5–8.1)

## 2020-06-01 LAB — PROTIME-INR
INR: 1.2 (ref 0.8–1.2)
Prothrombin Time: 15 seconds (ref 11.4–15.2)

## 2020-06-01 LAB — PROCALCITONIN: Procalcitonin: 0.36 ng/mL

## 2020-06-01 LAB — MRSA PCR SCREENING: MRSA by PCR: NEGATIVE

## 2020-06-01 LAB — BLOOD GAS, ARTERIAL
Acid-base deficit: 1.2 mmol/L (ref 0.0–2.0)
Bicarbonate: 24.2 mmol/L (ref 20.0–28.0)
FIO2: 70
O2 Saturation: 94.7 %
Patient temperature: 38.3
pCO2 arterial: 32.5 mmHg (ref 32.0–48.0)
pH, Arterial: 7.451 — ABNORMAL HIGH (ref 7.350–7.450)
pO2, Arterial: 78.2 mmHg — ABNORMAL LOW (ref 83.0–108.0)

## 2020-06-01 LAB — TRIGLYCERIDES: Triglycerides: 44 mg/dL (ref <150)

## 2020-06-01 LAB — FIBRINOGEN: Fibrinogen: >800 mg/dL — ABNORMAL HIGH (ref 210–475)

## 2020-06-01 LAB — POC SARS CORONAVIRUS 2 AG -  ED: SARS Coronavirus 2 Ag: POSITIVE — AB

## 2020-06-01 LAB — LACTATE DEHYDROGENASE: LDH: 323 U/L — ABNORMAL HIGH (ref 98–192)

## 2020-06-01 LAB — D-DIMER, QUANTITATIVE: D-Dimer, Quant: 2.95 ug/mL-FEU — ABNORMAL HIGH (ref 0.00–0.50)

## 2020-06-01 LAB — LACTIC ACID, PLASMA
Lactic Acid, Venous: 3.1 mmol/L (ref 0.5–1.9)
Lactic Acid, Venous: 1.8 mmol/L (ref 0.5–1.9)

## 2020-06-01 LAB — C-REACTIVE PROTEIN: CRP: 36.4 mg/dL — ABNORMAL HIGH (ref <1.0)

## 2020-06-01 LAB — BRAIN NATRIURETIC PEPTIDE: B Natriuretic Peptide: 434 pg/mL — ABNORMAL HIGH (ref 0.0–100.0)

## 2020-06-01 LAB — APTT: aPTT: 43 seconds — ABNORMAL HIGH (ref 24–36)

## 2020-06-01 LAB — FERRITIN: Ferritin: 528 ng/mL — ABNORMAL HIGH (ref 24–336)

## 2020-06-01 LAB — CBG MONITORING, ED
Glucose-Capillary: 123 mg/dL — ABNORMAL HIGH (ref 70–99)
Glucose-Capillary: 163 mg/dL — ABNORMAL HIGH (ref 70–99)

## 2020-06-01 MED ORDER — DILTIAZEM HCL 25 MG/5ML IV SOLN
10.0000 mg | Freq: Once | INTRAVENOUS | Status: AC
  Administered 2020-06-01: 10 mg via INTRAVENOUS
  Filled 2020-06-01: qty 5

## 2020-06-01 MED ORDER — ALBUTEROL SULFATE HFA 108 (90 BASE) MCG/ACT IN AERS
2.0000 | INHALATION_SPRAY | Freq: Once | RESPIRATORY_TRACT | Status: AC
  Administered 2020-06-01: 2 via RESPIRATORY_TRACT
  Filled 2020-06-01: qty 6.7

## 2020-06-01 MED ORDER — IPRATROPIUM-ALBUTEROL 20-100 MCG/ACT IN AERS
1.0000 | INHALATION_SPRAY | Freq: Four times a day (QID) | RESPIRATORY_TRACT | Status: DC
  Administered 2020-06-02 (×4): 1 via RESPIRATORY_TRACT
  Filled 2020-06-01: qty 4

## 2020-06-01 MED ORDER — IOHEXOL 350 MG/ML SOLN
75.0000 mL | Freq: Once | INTRAVENOUS | Status: AC | PRN
  Administered 2020-06-01: 75 mL via INTRAVENOUS

## 2020-06-01 MED ORDER — SODIUM CHLORIDE 0.9% FLUSH
3.0000 mL | INTRAVENOUS | Status: DC | PRN
  Administered 2020-06-02: 3 mL via INTRAVENOUS

## 2020-06-01 MED ORDER — ACETAMINOPHEN 325 MG PO TABS
650.0000 mg | ORAL_TABLET | Freq: Once | ORAL | Status: DC
  Filled 2020-06-01: qty 2

## 2020-06-01 MED ORDER — ALBUTEROL SULFATE (2.5 MG/3ML) 0.083% IN NEBU: 2.5000 mg | INHALATION_SOLUTION | Freq: Once | RESPIRATORY_TRACT | Status: DC

## 2020-06-01 MED ORDER — METOPROLOL TARTRATE 5 MG/5ML IV SOLN
5.0000 mg | Freq: Four times a day (QID) | INTRAVENOUS | Status: DC | PRN
  Administered 2020-06-02 – 2020-06-06 (×3): 5 mg via INTRAVENOUS
  Filled 2020-06-01 (×3): qty 5

## 2020-06-01 MED ORDER — BISACODYL 10 MG RE SUPP: 10.0000 mg | Freq: Every day | RECTAL | Status: DC | PRN

## 2020-06-01 MED ORDER — ADULT MULTIVITAMIN W/MINERALS CH
1.0000 | ORAL_TABLET | Freq: Every day | ORAL | Status: DC
  Administered 2020-06-02: 1 via ORAL
  Filled 2020-06-01: qty 1

## 2020-06-01 MED ORDER — THIAMINE HCL 100 MG PO TABS
100.0000 mg | ORAL_TABLET | Freq: Every day | ORAL | Status: DC
  Administered 2020-06-02: 100 mg via ORAL
  Filled 2020-06-01: qty 1

## 2020-06-01 MED ORDER — METHYLPREDNISOLONE SODIUM SUCC 125 MG IJ SOLR
125.0000 mg | Freq: Once | INTRAMUSCULAR | Status: AC
  Administered 2020-06-01: 125 mg via INTRAVENOUS
  Filled 2020-06-01: qty 2

## 2020-06-01 MED ORDER — POLYETHYLENE GLYCOL 3350 17 G PO PACK: 17.0000 g | PACK | Freq: Every day | ORAL | Status: DC | PRN

## 2020-06-01 MED ORDER — TRAZODONE HCL 50 MG PO TABS: 50.0000 mg | ORAL_TABLET | Freq: Every evening | ORAL | Status: DC | PRN

## 2020-06-01 MED ORDER — ONDANSETRON HCL 4 MG PO TABS: 4.0000 mg | ORAL_TABLET | Freq: Four times a day (QID) | ORAL | Status: DC | PRN

## 2020-06-01 MED ORDER — BARICITINIB 2 MG PO TABS: 2.0000 mg | ORAL_TABLET | Freq: Every day | ORAL | Status: DC

## 2020-06-01 MED ORDER — FOLIC ACID 1 MG PO TABS
1.0000 mg | ORAL_TABLET | Freq: Every day | ORAL | Status: DC
  Administered 2020-06-02: 1 mg via ORAL
  Filled 2020-06-01: qty 1

## 2020-06-01 MED ORDER — SODIUM CHLORIDE 0.9 % IV BOLUS
500.0000 mL | Freq: Once | INTRAVENOUS | Status: AC
  Administered 2020-06-01: 500 mL via INTRAVENOUS

## 2020-06-01 MED ORDER — METHYLPREDNISOLONE SODIUM SUCC 40 MG IJ SOLR: 40.0000 mg | Freq: Two times a day (BID) | INTRAMUSCULAR | Status: DC

## 2020-06-01 MED ORDER — SODIUM CHLORIDE 0.9 % IV SOLN: INTRAVENOUS | Status: AC

## 2020-06-01 MED ORDER — HYDROCOD POLST-CPM POLST ER 10-8 MG/5ML PO SUER
5.0000 mL | Freq: Two times a day (BID) | ORAL | Status: DC | PRN
Start: 2020-06-01 — End: 2020-06-12

## 2020-06-01 MED ORDER — PREDNISONE 50 MG PO TABS: 50.0000 mg | ORAL_TABLET | Freq: Every day | ORAL | Status: DC

## 2020-06-01 MED ORDER — SODIUM CHLORIDE 0.9% FLUSH
3.0000 mL | Freq: Two times a day (BID) | INTRAVENOUS | Status: DC
  Administered 2020-06-01 – 2020-06-18 (×26): 3 mL via INTRAVENOUS

## 2020-06-01 MED ORDER — VANCOMYCIN HCL 1500 MG/300ML IV SOLN
1500.0000 mg | Freq: Once | INTRAVENOUS | Status: AC
  Administered 2020-06-01: 1500 mg via INTRAVENOUS
  Filled 2020-06-01: qty 300

## 2020-06-01 MED ORDER — ENOXAPARIN SODIUM 30 MG/0.3ML ~~LOC~~ SOLN
30.0000 mg | Freq: Two times a day (BID) | SUBCUTANEOUS | Status: DC
  Administered 2020-06-01 – 2020-06-03 (×4): 30 mg via SUBCUTANEOUS
  Filled 2020-06-01 (×4): qty 0.3

## 2020-06-01 MED ORDER — SODIUM CHLORIDE 0.9 % IV SOLN
1.0000 g | Freq: Once | INTRAVENOUS | Status: AC
  Administered 2020-06-01: 1 g via INTRAVENOUS
  Filled 2020-06-01: qty 10

## 2020-06-01 MED ORDER — ACETAMINOPHEN 650 MG RE SUPP: 650.0000 mg | Freq: Four times a day (QID) | RECTAL | Status: DC | PRN

## 2020-06-01 MED ORDER — SODIUM CHLORIDE 0.9% FLUSH
3.0000 mL | Freq: Two times a day (BID) | INTRAVENOUS | Status: DC
  Administered 2020-06-01 – 2020-06-17 (×30): 3 mL via INTRAVENOUS

## 2020-06-01 MED ORDER — MORPHINE SULFATE (PF) 2 MG/ML IV SOLN
2.0000 mg | INTRAVENOUS | Status: DC | PRN
  Administered 2020-06-02 – 2020-06-03 (×2): 2 mg via INTRAVENOUS
  Filled 2020-06-01 (×2): qty 1

## 2020-06-01 MED ORDER — SODIUM CHLORIDE 0.9 % IV BOLUS: 1000.0000 mL | Freq: Once | INTRAVENOUS | Status: DC

## 2020-06-01 MED ORDER — DILTIAZEM HCL-DEXTROSE 125-5 MG/125ML-% IV SOLN (PREMIX)
5.0000 mg/h | INTRAVENOUS | Status: DC
  Administered 2020-06-01: 5 mg/h via INTRAVENOUS
  Administered 2020-06-02: 10 mg/h via INTRAVENOUS
  Administered 2020-06-02 – 2020-06-05 (×3): 5 mg/h via INTRAVENOUS
  Filled 2020-06-01 (×8): qty 125

## 2020-06-01 MED ORDER — GUAIFENESIN-DM 100-10 MG/5ML PO SYRP: 10.0000 mL | ORAL_SOLUTION | ORAL | Status: DC | PRN

## 2020-06-01 MED ORDER — VANCOMYCIN HCL 750 MG/150ML IV SOLN
750.0000 mg | Freq: Two times a day (BID) | INTRAVENOUS | Status: DC
  Administered 2020-06-02: 750 mg via INTRAVENOUS
  Filled 2020-06-01 (×3): qty 150

## 2020-06-01 MED ORDER — ASCORBIC ACID 500 MG PO TABS
500.0000 mg | ORAL_TABLET | Freq: Every day | ORAL | Status: DC
  Administered 2020-06-02: 500 mg via ORAL
  Filled 2020-06-01: qty 1

## 2020-06-01 MED ORDER — IPRATROPIUM-ALBUTEROL 0.5-2.5 (3) MG/3ML IN SOLN: 3.0000 mL | Freq: Once | RESPIRATORY_TRACT | Status: DC

## 2020-06-01 MED ORDER — ZINC SULFATE 220 (50 ZN) MG PO CAPS
220.0000 mg | ORAL_CAPSULE | Freq: Every day | ORAL | Status: DC
  Administered 2020-06-02 – 2020-06-04 (×2): 220 mg via ORAL
  Filled 2020-06-01: qty 1

## 2020-06-01 MED ORDER — INSULIN ASPART 100 UNIT/ML ~~LOC~~ SOLN
0.0000 [IU] | SUBCUTANEOUS | Status: DC
  Administered 2020-06-01: 2 [IU] via SUBCUTANEOUS
  Administered 2020-06-02 – 2020-06-11 (×32): 1 [IU] via SUBCUTANEOUS
  Administered 2020-06-11: 2 [IU] via SUBCUTANEOUS
  Administered 2020-06-11 – 2020-06-17 (×14): 1 [IU] via SUBCUTANEOUS
  Filled 2020-06-01 (×5): qty 1

## 2020-06-01 MED ORDER — ACETAMINOPHEN 650 MG RE SUPP
650.0000 mg | Freq: Once | RECTAL | Status: AC
  Administered 2020-06-01: 650 mg via RECTAL
  Filled 2020-06-01: qty 1

## 2020-06-01 MED ORDER — ONDANSETRON HCL 4 MG/2ML IJ SOLN
4.0000 mg | Freq: Four times a day (QID) | INTRAMUSCULAR | Status: DC | PRN
Start: 2020-06-01 — End: 2020-06-12

## 2020-06-01 MED ORDER — SODIUM CHLORIDE 0.9 % IV SOLN: 250.0000 mL | INTRAVENOUS | Status: DC | PRN

## 2020-06-01 MED ORDER — POLYVINYL ALCOHOL 1.4 % OP SOLN
1.0000 [drp] | Freq: Every day | OPHTHALMIC | Status: DC | PRN
  Filled 2020-06-01: qty 15

## 2020-06-01 MED ORDER — ACETAMINOPHEN 325 MG PO TABS: 650.0000 mg | ORAL_TABLET | Freq: Four times a day (QID) | ORAL | Status: DC | PRN

## 2020-06-01 MED ORDER — METHYLPREDNISOLONE SODIUM SUCC 40 MG IJ SOLR: 0.5000 mg/kg | Freq: Two times a day (BID) | INTRAMUSCULAR | Status: DC

## 2020-06-01 MED ORDER — SODIUM CHLORIDE 0.9 % IV SOLN
500.0000 mg | Freq: Once | INTRAVENOUS | Status: AC
  Administered 2020-06-01: 500 mg via INTRAVENOUS
  Filled 2020-06-01: qty 500

## 2020-06-01 MED ORDER — PANTOPRAZOLE SODIUM 40 MG IV SOLR
40.0000 mg | Freq: Every day | INTRAVENOUS | Status: DC
  Administered 2020-06-01 – 2020-06-17 (×17): 40 mg via INTRAVENOUS
  Filled 2020-06-01 (×17): qty 40

## 2020-06-01 MED ORDER — TOCILIZUMAB 400 MG/20ML IV SOLN
8.0000 mg/kg | Freq: Once | INTRAVENOUS | Status: AC
  Administered 2020-06-01: 598 mg via INTRAVENOUS
  Filled 2020-06-01: qty 29.9

## 2020-06-01 NOTE — Progress Notes (Signed)
Pharmacy Antibiotic Note  Joshua Robinson is a 86 y.o. male admitted on 06/01/2020 with pneumonia.  Pharmacy has been consulted for vancomycin dosing.  Plan: Vancomycin 750mg  IV every 12 hours.  Goal trough 15-20 mcg/mL.  Height: 5\' 8"  (172.7 cm) Weight: 74.8 kg (165 lb) IBW/kg (Calculated) : 68.4  Temp (24hrs), Avg:99.8 F (37.7 C), Min:98.5 F (36.9 C), Max:101 F (38.3 C)  Recent Labs  Lab 06/01/20 1215  WBC 22.3*  CREATININE 0.98  LATICACIDVEN 3.1*    Estimated Creatinine Clearance: 55.3 mL/min (by C-G formula based on SCr of 0.98 mg/dL).    Allergies  Allergen Reactions  . Bee Venom Other (See Comments)    Stung a few times and passed out    Antimicrobials this admission: 1/16 vancomycin >>  1/16 azithromycin >>  1/16 ceftriaxone x 1    Microbiology results: 1/16 BCx: sent 1/16 UCx: sent  1/16 MRSA PCR: sent   Thank you for allowing pharmacy to be a part of this patient's care.  Donna Christen Lannah Koike 06/01/2020 1:34 PM

## 2020-06-01 NOTE — ED Notes (Signed)
Pts family updated on Pts status at this time via telephone following HIPPA compliance.

## 2020-06-01 NOTE — ED Notes (Signed)
Pt placed on bipap by RT

## 2020-06-01 NOTE — H&P (Addendum)
Patient Demographics:    Joshua Robinson, is a 86 y.o. male  MRN: [id number]   DOB - 03/23/1935  Admit Date - 06/01/2020  Outpatient Primary MD for the patient is Redmond School, MD   Assessment & Plan:    Principal Problem:   Acute respiratory failure with hypoxia Due to Covid PNA Active Problems:   Acute respiratory disease due to COVID-19 virus   Pneumonia due to COVID-19 virus    1)Acute hypoxic respiratory failure secondary to COVID-19 infection/Pneumonia--- The treatment plan and use of medications  for treatment of COVID-19 infection and possible side effects were discussed with patient and his wife  -Patient completed Moderna vaccination in January and February 2021, he is NOT boosted -Severe persistent hypoxia with altered mentation, failed high flow nasal cannula, requiring continuous BiPAP -CTA chest without PE -----Patient and his wife  verbalizes understanding and agrees to treatment protocols  --Patient is positive for COVID-19 infection, chest x-ray with findings of infiltrates/opacities,  patient is tachypneic/hypoxic and requiring continuous supplemental oxygen---patient meets criteria for initiation of Steroid therapy and Actemra per protocol  --Check and trend inflammatory markers including D-dimer, ferritin and  CRP---also follow CBC and CMP --Supplemental oxygen to keep O2 sats above 93% -Follow serial chest x-rays and ABGs as indicated --- Encourage prone positioning for More than 16 hours/day in increments of 2 to 3 hours at a time if able to tolerate --Attempt to maintain euvolemic state --Zinc and vitamin C as ordered -Albuterol inhaler as needed -Accu-Cheks/fingersticks while on high-dose steroids -PPI while on high-dose steroids -Enhanced dosage of anticoagulant for DVT prophylaxis  given hypercoagulable state with COVID-19 infections  Actemra/Baricitinib:- The patient has hypoxia and is high-risk for intubation, but expected to survive >48 hours and has good baseline functional status.  She is not known to be on immunomodulators, anti-rejection medications, or cancer chemotherapy, has no history of TB or latent TB, and no history of diverticulitis or intestinal perforation.  Platelets are >50K, ANC is >500, and ALT/AST are below 5x ULN with no known hepatitis B infection. Baricitinib is being used under EUA by the FDA. The patient has no ESRD or AKI, known history of TB, severe neutropenia (ANC <500) or lymphopenia, or severe LFT elevations. They are not on DMARDs or probenecid, and are not pregnant. The option to use/refuse baricitinib treatment under FDA authorization (not approval), the significant known and potential risks and benefits, the extent to which these are unknown, and information regarding all available alternatives were discussed in detail. Specifically the risk of VTE and secondary infections were discussed in detail with the patient and/or HCPOA. They consent to proceed with treatment. - Give  Actemra-- on 06/01/20  Monitor Cr, LFTs, differential. Keep on VTE ppx COVID-19 Labs  Recent Labs    06/01/20 1217  DDIMER 2.95*  FERRITIN 528*  LDH 323*  CRP 36.4*    2)FEN--- patient is currently mostly n.p.o. due to respiratory distress requiring continuous  BiPAP use, okay to use gentle IV fluids until oral intake resumes -Attempt to maintain euvolemic status in this COVID-patient --  3)Social/Ethics-- Discussed with patient and his wife ----he is a full code with no limitations to treatment  4)New onset A. fib with RVR--- -BNP is 434, troponin is 337--- in the setting of A. fib with RVR  -Echo pending -TSH pending -Continue IV Cardizem drip, may switch to metoprolol if echo shows low EF -Hold off on full anticoagulation for now unless A. fib becomes  persistent  5) hyponatremia--- most likely due to dehydration, patient has diarrhea and poor oral intake PTA -Hydrate gently IV until oral intake resumes  6)Leukocytosis--- suspect steroid-induced patient was started on methylprednisolone as outpatient on 05/30/2020 by Dr. Gerarda Fraction -  Disposition/Need for in-Hospital Stay- patient unable to be discharged at this time due to --acute hypoxic respiratory failure in the setting of COVID-pneumonia requiring BiPAP and  IV steroids, new onset A. fib with RVR requiring IV Cardizem drip  Status is: Inpatient  Remains inpatient appropriate because:Please see above   Dispo: The patient is from: Home              Anticipated d/c is to: Home              Anticipated d/c date is: > 3 days              Patient currently is not medically stable to d/c. Barriers: Not Clinically Stable-    With History of - Reviewed by me  Past Medical History:  Diagnosis Date  . Arthritis   . Cancer [facility])    skin      Past Surgical History:  Procedure Laterality Date  . CATARACT EXTRACTION Right   . FOOT SURGERY Bilateral 86 years old   "painful flat foot surgery"  . SKIN CANCER EXCISION Right    cheek  . TOTAL KNEE ARTHROPLASTY Right 02/19/2013   Procedure: RIGHT TOTAL KNEE ARTHROPLASTY;  Surgeon: Gearlean Alf, MD;  Location: [facility];  Service: Orthopedics;  Laterality: Right;    Chief Complaint  Patient presents with  . Shortness of Breath      HPI:    Joshua Robinson  is a 86 y.o. male who is a reformed smoker without any significant past medical history presents to the ED on 06/01/2020 by EMS with significant dyspnea and productive cough with green sputum for at least a week now---he went to see PCP (Dr. Gerarda Fraction) on 05/29/2020 --who started patient on methylprednisolone/steroids and azithromycin -Per EMS O2 sats was 57% on room air patient was placed on CPAP O2 sats improved to 85%, patient received nitro sublingual, had audible rales/crackles per  EMS -Upon arrival in the ED patient was found to be in A. fib with RVR with heart rate in 150s, he was tachypneic with respiratory rate in the 50s--EDP gave IV Cardizem  -He did not do well with high flow nasal cannula, required BiPAP  Patient completed Moderna vaccination in January and February 2021, he is NOT boosted -Severe persistent hypoxia with altered mentation, failed high flow nasal cannula, requiring continuous BiPAP -- -COVID-19 test positive -Chest x-ray consistent with multifocal pneumonia bilaterally in the setting of COVID-19 infection - COVID-19 Labs Recent Labs    06/01/20 1217  DDIMER 2.95*  FERRITIN 528*  LDH 323*  CRP 36.4*   -Fibrinogen is greater than 800 -PCT 0.36, lactic acid is 3.1 -BNP is 434, troponin is 337 -Sodium is low at 132, WBC  is 22.3 -EDP empirically given vancomycin and Rocephin, as well as azithromycin -- CTA chest without PE  --Additional history obtained from patient's wife by phone, patient apparently has been sick for at least 7 days, with fevers, cough, URI symptoms myalgia malaise , had diarrhea for couple days -Leukocytosis noted in the setting of steroid use    Review of systems:    In addition to the HPI above,   A full Review of  Systems was done, all other systems reviewed are negative except as noted above in HPI , .    Social History:  Reviewed by me    Social History   Tobacco Use  . Smoking status: Former Smoker    Years: 40.00    Types: Cigarettes    Quit date: 05/17/1996    Years since quitting: 24.0  . Smokeless tobacco: Current User    Types: Snuff  Substance Use Topics  . Alcohol use: No     Family History :  Reviewed by me    Family History  Problem Relation Age of Onset  . Diabetes Mother   . Hypertension Father      Home Medications:   Prior to Admission medications   Medication Sig Start Date End Date Taking? Authorizing Provider  methocarbamol (ROBAXIN) 500 MG tablet Take 1 tablet (500  mg total) by mouth every 6 (six) hours as needed. 02/21/13   Perkins, Alexzandrew L, PA-C  oxyCODONE (OXY IR/ROXICODONE) 5 MG immediate release tablet Take 1-2 tablets (5-10 mg total) by mouth every 3 (three) hours as needed. 02/21/13   Perkins, Alexzandrew L, PA-C  polyvinyl alcohol (LIQUIFILM TEARS) 1.4 % ophthalmic solution Place 1 drop into both eyes daily as needed (dry eyes).    [provider]  rivaroxaban (XARELTO) 10 MG TABS tablet Take 1 tablet (10 mg total) by mouth daily with breakfast. Take Xarelto for two and a half more weeks, then discontinue Xarelto. Once the patient has completed the Xarelto, they may resume the 81 mg Aspirin. 02/21/13   Perkins, Alexzandrew L, PA-C  traMADol (ULTRAM) 50 MG tablet Take 1-2 tablets (50-100 mg total) by mouth every 6 (six) hours as needed (mild pain). 02/21/13   Perkins, Alexzandrew L, PA-C     Allergies:     Allergies  Allergen Reactions  . Bee Venom Other (See Comments)    Stung a few times and passed out     Physical Exam:   Vitals  Blood pressure 103/74, pulse 80, temperature (!) 101 F (38.3 C), temperature source Rectal, resp. rate (!) 33, height 5\' 8"  (1.727 m), weight 74.8 kg, SpO2 91 %.  Physical Examination: General appearance - alert, very ill appearing, and in acute respiratory distress  mental status -lethargic  eyes - sclera anicteric Nose/Mouth-BiPAP mask Neck - supple, no JVD elevation , Chest -diminished breath sounds with scattered rhonchi Heart - S1 and S2 normal, irregularly irregular and tachycardic Abdomen - soft, nontender, nondistended, no CVA tenderness Neurological -limited exam due to lethargy, no obvious focal deficits Extremities - no pedal edema noted, intact peripheral pulses  Skin - warm, dry     Data Review:    CBC Recent Labs  Lab 06/01/20 1215  WBC 22.3*  HGB 14.2  HCT 40.8  PLT 368  MCV 97.1  MCH 33.8  MCHC 34.8  RDW 13.2  LYMPHSABS 0.6*  MONOABS 1.3*  EOSABS 0.0  BASOSABS  0.1   ------------------------------------------------------------------------------------------------------------------  Chemistries  Recent Labs  Lab 06/01/20 1215  NA  132*  K 4.0  CL 98  CO2 23  GLUCOSE 127*  BUN 22  CREATININE 0.98  CALCIUM 8.6*  AST 38  ALT 41  ALKPHOS 124  BILITOT 0.8   ------------------------------------------------------------------------------------------------------------------ estimated creatinine clearance is 55.3 mL/min (by C-G formula based on SCr of 0.98 mg/dL). ------------------------------------------------------------------------------------------------------------------ No results for input(s): TSH, T4TOTAL, T3FREE, THYROIDAB in the last 72 hours.  Invalid input(s): FREET3   Coagulation profile Recent Labs  Lab 06/01/20 1215  INR 1.2   ------------------------------------------------------------------------------------------------------------------- Recent Labs    06/01/20 1217  DDIMER 2.95*   -------------------------------------------------------------------------------------------------------------------  Cardiac Enzymes No results for input(s): CKMB, TROPONINI, MYOGLOBIN in the last 168 hours.  Invalid input(s): CK ------------------------------------------------------------------------------------------------------------------    Component Value Date/Time   BNP 434.0 (H) 06/01/2020 1217     ---------------------------------------------------------------------------------------------------------------  Urinalysis    Component Value Date/Time   COLORURINE YELLOW 02/12/2013 1150   APPEARANCEUR CLEAR 02/12/2013 1150   LABSPEC 1.011 02/12/2013 1150   PHURINE 7.0 02/12/2013 1150   GLUCOSEU NEGATIVE 02/12/2013 1150   HGBUR NEGATIVE 02/12/2013 1150   BILIRUBINUR NEGATIVE 02/12/2013 1150   KETONESUR NEGATIVE 02/12/2013 1150   PROTEINUR NEGATIVE 02/12/2013 1150   UROBILINOGEN 0.2 02/12/2013 1150   NITRITE NEGATIVE  02/12/2013 1150   LEUKOCYTESUR NEGATIVE 02/12/2013 1150    ----------------------------------------------------------------------------------------------------------------   Imaging Results:    DG Chest Port 1 View  Result Date: 06/01/2020 CLINICAL DATA:  PT on Bipap unable to give hx. Positive Covid test. Hx of CA. ER notes:Pt brought in by [facility] from home with c/o SOB and productive green sputum x 7 days. EMS arrived and pt was found to be 67% on RA. Pt was placed on CPAP and sats increased to 85%. Pt was also given 4 tablets of Nitro by EMS. EMS reports crackles in all lung fields. HR 150, resp 50 per EMS EXAM: PORTABLE CHEST 1 VIEW COMPARISON:  02/12/2013 FINDINGS: Bilateral hazy airspace lung opacities are noted consistent multifocal pneumonia. No evidence of pulmonary edema. No pleural effusion or pneumothorax. Cardiac silhouette is normal in size. No mediastinal or hilar masses. Skeletal structures are grossly intact. IMPRESSION: Bilateral hazy airspace lung opacities consistent with multifocal pneumonia, pattern consistent with atypical/viral infection including COVID 19. Electronically Signed   By: Lajean Manes M.D.   On: 06/01/2020 12:37    Radiological Exams on Admission: DG Chest Port 1 View  Result Date: 06/01/2020 CLINICAL DATA:  PT on Bipap unable to give hx. Positive Covid test. Hx of CA. ER notes:Pt brought in by [facility] from home with c/o SOB and productive green sputum x 7 days. EMS arrived and pt was found to be 67% on RA. Pt was placed on CPAP and sats increased to 85%. Pt was also given 4 tablets of Nitro by EMS. EMS reports crackles in all lung fields. HR 150, resp 50 per EMS EXAM: PORTABLE CHEST 1 VIEW COMPARISON:  02/12/2013 FINDINGS: Bilateral hazy airspace lung opacities are noted consistent multifocal pneumonia. No evidence of pulmonary edema. No pleural effusion or pneumothorax. Cardiac silhouette is normal in size. No mediastinal or hilar masses. Skeletal structures are  grossly intact. IMPRESSION: Bilateral hazy airspace lung opacities consistent with multifocal pneumonia, pattern consistent with atypical/viral infection including COVID 19. Electronically Signed   By: Lajean Manes M.D.   On: 06/01/2020 12:37    DVT Prophylaxis -SCD/lovenox AM Labs Ordered, also please review Full Orders  Family Communication: Admission, patients condition and plan of care including tests being ordered have been discussed with the patient wife  who indicate understanding and agree with the plan   Code Status - Full Code  Likely DC to home after resolution of acute respiratory symptoms  Condition   stable  Roxan Hockey M.D on 06/01/2020 at 2:22 PM Go to [email or web address] -  for contact info  [facility] - Office  [phone]

## 2020-06-01 NOTE — ED Provider Notes (Signed)
[facility] EMERGENCY DEPARTMENT Provider Note   CSN: [id number] Arrival date & time: 06/01/20  1206     History Chief Complaint  Patient presents with  . Shortness of Breath    Joshua Robinson is a 86 y.o. male.  Patient states that he has had a cough for a week.  It is yellow sputum production.  Patient complains of shortness of breath now.  Patient has a 20-year history of smoking but has not smoked recently and no other medical problems.  He did get his vaccine for COVID.  Patient was picked up by the paramedics and his O2 sat on room air was in the 60s  The history is provided by the patient. No language interpreter was used.  Shortness of Breath Severity:  Moderate Onset quality:  Sudden Timing:  Constant Progression:  Worsening Chronicity:  New Context: activity   Relieved by:  Nothing Worsened by:  Nothing Ineffective treatments:  None tried Associated symptoms: no abdominal pain, no chest pain, no cough, no headaches and no rash        Past Medical History:  Diagnosis Date  . Arthritis   . Cancer [facility])    skin    Patient Active Problem List   Diagnosis Date Noted  . Acute respiratory disease due to COVID-19 virus 06/01/2020  . Acute respiratory failure with hypoxia Due to Covid PNA 06/01/2020  . Pneumonia due to COVID-19 virus 06/01/2020  . Postoperative anemia due to acute blood loss 03/02/2013  . Hyponatremia 03/02/2013  . OA (osteoarthritis) of knee 02/19/2013    Past Surgical History:  Procedure Laterality Date  . CATARACT EXTRACTION Right   . FOOT SURGERY Bilateral 86 years old   "painful flat foot surgery"  . SKIN CANCER EXCISION Right    cheek  . TOTAL KNEE ARTHROPLASTY Right 02/19/2013   Procedure: RIGHT TOTAL KNEE ARTHROPLASTY;  Surgeon: Gearlean Alf, MD;  Location: [facility] ORS;  Service: Orthopedics;  Laterality: Right;       Family History  Problem Relation Age of Onset  . Diabetes Mother   . Hypertension Father     Social  History   Tobacco Use  . Smoking status: Former Smoker    Years: 40.00    Types: Cigarettes    Quit date: 05/17/1996    Years since quitting: 24.0  . Smokeless tobacco: Current User    Types: Snuff  Vaping Use  . Vaping Use: Never used  Substance Use Topics  . Alcohol use: No  . Drug use: No    Home Medications Prior to Admission medications   Medication Sig Start Date End Date Taking? Authorizing Provider  methocarbamol (ROBAXIN) 500 MG tablet Take 1 tablet (500 mg total) by mouth every 6 (six) hours as needed. 02/21/13   Perkins, Alexzandrew L, PA-C  oxyCODONE (OXY IR/ROXICODONE) 5 MG immediate release tablet Take 1-2 tablets (5-10 mg total) by mouth every 3 (three) hours as needed. 02/21/13   Perkins, Alexzandrew L, PA-C  polyvinyl alcohol (LIQUIFILM TEARS) 1.4 % ophthalmic solution Place 1 drop into both eyes daily as needed (dry eyes).    [provider]  rivaroxaban (XARELTO) 10 MG TABS tablet Take 1 tablet (10 mg total) by mouth daily with breakfast. Take Xarelto for two and a half more weeks, then discontinue Xarelto. Once the patient has completed the Xarelto, they may resume the 81 mg Aspirin. 02/21/13   Perkins, Alexzandrew L, PA-C  traMADol (ULTRAM) 50 MG tablet Take 1-2 tablets (50-100 mg  total) by mouth every 6 (six) hours as needed (mild pain). 02/21/13   Perkins, Alexzandrew L, PA-C    Allergies    Bee venom  Review of Systems   Review of Systems  Constitutional: Negative for appetite change and fatigue.  HENT: Negative for congestion, ear discharge and sinus pressure.   Eyes: Negative for discharge.  Respiratory: Positive for shortness of breath. Negative for cough.   Cardiovascular: Negative for chest pain.  Gastrointestinal: Negative for abdominal pain and diarrhea.  Genitourinary: Negative for frequency and hematuria.  Musculoskeletal: Negative for back pain.  Skin: Negative for rash.  Neurological: Negative for seizures and headaches.   Psychiatric/Behavioral: Negative for hallucinations.    Physical Exam Updated Vital Signs BP 110/77   Pulse (!) 113   Temp (!) 101.3 F (38.5 C)   Resp (!) 29   Ht 5\' 8"  (1.727 m)   Wt 74.8 kg   SpO2 96%   BMI 25.09 kg/m   Physical Exam Vitals and nursing note reviewed.  Constitutional:      Appearance: He is well-developed.  HENT:     Head: Normocephalic.     Nose: Nose normal.     Mouth/Throat:     Mouth: Mucous membranes are moist.  Eyes:     General: No scleral icterus.    Extraocular Movements: EOM normal.     Conjunctiva/sclera: Conjunctivae normal.  Neck:     Thyroid: No thyromegaly.  Cardiovascular:     Rate and Rhythm: Tachycardia present. Rhythm irregular.     Heart sounds: No murmur heard. No friction rub. No gallop.   Pulmonary:     Breath sounds: No stridor. No wheezing or rales.  Chest:     Chest wall: No tenderness.  Abdominal:     General: There is no distension.     Tenderness: There is no abdominal tenderness. There is no rebound.  Musculoskeletal:        General: No edema. Normal range of motion.     Cervical back: Neck supple.  Lymphadenopathy:     Cervical: No cervical adenopathy.  Skin:    Findings: No erythema or rash.  Neurological:     Mental Status: He is alert and oriented to person, place, and time.     Motor: No abnormal muscle tone.     Coordination: Coordination normal.  Psychiatric:        Mood and Affect: Mood and affect normal.        Behavior: Behavior normal.     ED Results / Procedures / Treatments   Labs (all labs ordered are listed, but only abnormal results are displayed) Labs Reviewed  LACTIC ACID, PLASMA - Abnormal; Notable for the following components:      Result Value   Lactic Acid, Venous 3.1 (*)    All other components within normal limits  COMPREHENSIVE METABOLIC PANEL - Abnormal; Notable for the following components:   Sodium 132 (*)    Glucose, Bld 127 (*)    Calcium 8.6 (*)    Albumin 3.1 (*)     All other components within normal limits  CBC WITH DIFFERENTIAL/PLATELET - Abnormal; Notable for the following components:   WBC 22.3 (*)    RBC 4.20 (*)    Neutro Abs 19.9 (*)    Lymphs Abs 0.6 (*)    Monocytes Absolute 1.3 (*)    Abs Immature Granulocytes 0.38 (*)    All other components within normal limits  APTT - Abnormal; Notable for the  following components:   aPTT 43 (*)    All other components within normal limits  BRAIN NATRIURETIC PEPTIDE - Abnormal; Notable for the following components:   B Natriuretic Peptide 434.0 (*)    All other components within normal limits  D-DIMER, QUANTITATIVE (NOT AT [facility]) - Abnormal; Notable for the following components:   D-Dimer, Quant 2.95 (*)    All other components within normal limits  LACTATE DEHYDROGENASE - Abnormal; Notable for the following components:   LDH 323 (*)    All other components within normal limits  FERRITIN - Abnormal; Notable for the following components:   Ferritin 528 (*)    All other components within normal limits  FIBRINOGEN - Abnormal; Notable for the following components:   Fibrinogen >800 (*)    All other components within normal limits  C-REACTIVE PROTEIN - Abnormal; Notable for the following components:   CRP 36.4 (*)    All other components within normal limits  BLOOD GAS, ARTERIAL - Abnormal; Notable for the following components:   pH, Arterial 7.451 (*)    pO2, Arterial 78.2 (*)    All other components within normal limits  POC SARS CORONAVIRUS 2 AG -  ED - Abnormal; Notable for the following components:   SARS Coronavirus 2 Ag POSITIVE (*)    All other components within normal limits  TROPONIN I (HIGH SENSITIVITY) - Abnormal; Notable for the following components:   Troponin I (High Sensitivity) 337 (*)    All other components within normal limits  CULTURE, BLOOD (SINGLE)  URINE CULTURE  CULTURE, BLOOD (ROUTINE X 2)  MRSA PCR SCREENING  PROTIME-INR  PROCALCITONIN  TRIGLYCERIDES   URINALYSIS, ROUTINE W REFLEX MICROSCOPIC  TROPONIN I (HIGH SENSITIVITY)    EKG EKG Interpretation  Date/Time:  Sunday June 01 2020 12:23:30 EST Ventricular Rate:  151 PR Interval:    QRS Duration: 79 QT Interval:  284 QTC Calculation: 451 R Axis:   -18 Text Interpretation: Atrial fibrillation with rapid V-rate Left ventricular hypertrophy Anterior Q waves, possibly due to LVH Confirmed by Bethann Berkshire [phone]) on 06/01/2020 1:20:46 PM   Radiology DG Chest Port 1 View  Result Date: 06/01/2020 CLINICAL DATA:  PT on Bipap unable to give hx. Positive Covid test. Hx of CA. ER notes:Pt brought in by [facility] from home with c/o SOB and productive green sputum x 7 days. EMS arrived and pt was found to be 67% on RA. Pt was placed on CPAP and sats increased to 85%. Pt was also given 4 tablets of Nitro by EMS. EMS reports crackles in all lung fields. HR 150, resp 50 per EMS EXAM: PORTABLE CHEST 1 VIEW COMPARISON:  02/12/2013 FINDINGS: Bilateral hazy airspace lung opacities are noted consistent multifocal pneumonia. No evidence of pulmonary edema. No pleural effusion or pneumothorax. Cardiac silhouette is normal in size. No mediastinal or hilar masses. Skeletal structures are grossly intact. IMPRESSION: Bilateral hazy airspace lung opacities consistent with multifocal pneumonia, pattern consistent with atypical/viral infection including COVID 19. Electronically Signed   By: Amie Portland M.D.   On: 06/01/2020 12:37    Procedures Procedures (including critical care time)  Medications Ordered in ED Medications  sodium chloride 0.9 % bolus 1,000 mL (1,000 mLs Intravenous Not Given 06/01/20 1322)  diltiazem (CARDIZEM) 125 mg in dextrose 5% 125 mL (1 mg/mL) infusion (7.5 mg/hr Intravenous Infusion Verify 06/01/20 1427)  vancomycin (VANCOREADY) IVPB 1500 mg/300 mL (1,500 mg Intravenous New Bag/Given 06/01/20 1320)  vancomycin (VANCOREADY) IVPB 750 mg/150 mL (has no  administration in time range)   tocilizumab (ACTEMRA) 8 mg/kg = 598 mg in sodium chloride 0.9 % 100 mL infusion (has no administration in time range)  polyvinyl alcohol (LIQUIFILM TEARS) 1.4 % ophthalmic solution 1 drop (has no administration in time range)  sodium chloride flush (NS) 0.9 % injection 3 mL (has no administration in time range)  sodium chloride flush (NS) 0.9 % injection 3 mL (has no administration in time range)  sodium chloride flush (NS) 0.9 % injection 3 mL (has no administration in time range)  0.9 %  sodium chloride infusion (has no administration in time range)  acetaminophen (TYLENOL) tablet 650 mg (has no administration in time range)    Or  acetaminophen (TYLENOL) suppository 650 mg (has no administration in time range)  traZODone (DESYREL) tablet 50 mg (has no administration in time range)  polyethylene glycol (MIRALAX / GLYCOLAX) packet 17 g (has no administration in time range)  bisacodyl (DULCOLAX) suppository 10 mg (has no administration in time range)  ondansetron (ZOFRAN) tablet 4 mg (has no administration in time range)    Or  ondansetron (ZOFRAN) injection 4 mg (has no administration in time range)  enoxaparin (LOVENOX) injection 30 mg (has no administration in time range)  morphine 2 MG/ML injection 2 mg (has no administration in time range)  Ipratropium-Albuterol (COMBIVENT) respimat 1 puff (1 puff Inhalation Not Given 06/01/20 1434)  methylPREDNISolone sodium succinate (SOLU-MEDROL) 40 mg/mL injection 37.6 mg (has no administration in time range)    Followed by  predniSONE (DELTASONE) tablet 50 mg (has no administration in time range)  guaiFENesin-dextromethorphan (ROBITUSSIN DM) 100-10 MG/5ML syrup 10 mL (has no administration in time range)  chlorpheniramine-HYDROcodone (TUSSIONEX) 10-8 MG/5ML suspension 5 mL (has no administration in time range)  ascorbic acid (VITAMIN C) tablet 500 mg (has no administration in time range)  zinc sulfate capsule 220 mg (has no administration in time  range)  insulin aspart (novoLOG) injection 0-9 Units (has no administration in time range)  multivitamin with minerals tablet 1 tablet (has no administration in time range)  folic acid (FOLVITE) tablet 1 mg (has no administration in time range)  thiamine tablet 100 mg (has no administration in time range)  0.9 %  sodium chloride infusion (has no administration in time range)  cefTRIAXone (ROCEPHIN) 1 g in sodium chloride 0.9 % 100 mL IVPB (0 g Intravenous Stopped 06/01/20 1337)  azithromycin (ZITHROMAX) 500 mg in sodium chloride 0.9 % 250 mL IVPB (0 mg Intravenous Stopped 06/01/20 1425)  methylPREDNISolone sodium succinate (SOLU-MEDROL) 125 mg/2 mL injection 125 mg (125 mg Intravenous Given 06/01/20 1256)  diltiazem (CARDIZEM) injection 10 mg (10 mg Intravenous Given 06/01/20 1252)  acetaminophen (TYLENOL) suppository 650 mg (650 mg Rectal Given 06/01/20 1324)  albuterol (VENTOLIN HFA) 108 (90 Base) MCG/ACT inhaler 2 puff (2 puffs Inhalation Given 06/01/20 1313)  sodium chloride 0.9 % bolus 500 mL (0 mLs Intravenous Stopped 06/01/20 1425)  iohexol (OMNIPAQUE) 350 MG/ML injection 75 mL (75 mLs Intravenous Contrast Given 06/01/20 1344)    ED Course  I have reviewed the triage vital signs and the nursing notes.  Pertinent labs & imaging results that were available during my care of the patient were reviewed by me and considered in my medical decision making (see chart for details). Patient with severe hypoxia.  He was put on BiPAP and tolerated it well and 70%.  Patient is COVID-positive along with rapid atrial fib.  He was started on diltiazem to control his rate.  He will  be admitted to medicine and will get CT angio to rule out PE   CRITICAL CARE Performed by: Milton Ferguson Total critical care time: 45 minutes Critical care time was exclusive of separately billable procedures and treating other patients. Critical care was necessary to treat or prevent imminent or life-threatening  deterioration. Critical care was time spent personally by me on the following activities: development of treatment plan with patient and/or surrogate as well as nursing, discussions with consultants, evaluation of patient's response to treatment, examination of patient, obtaining history from patient or surrogate, ordering and performing treatments and interventions, ordering and review of laboratory studies, ordering and review of radiographic studies, pulse oximetry and re-evaluation of patient's condition.  MDM Rules/Calculators/A&P                          COVID-pneumonia with rapid atrial fib.  Patient will be admitted to medicine to ICU Final Clinical Impression(s) / ED Diagnoses Final diagnoses:  Hypoxia    Rx / DC Orders ED Discharge Orders    None       Milton Ferguson, MD 06/01/20 1446

## 2020-06-01 NOTE — ED Notes (Signed)
MD made aware of pts pressure. 70/50. Awaiting orders. Cardizem drip paused at this time.

## 2020-06-01 NOTE — ED Triage Notes (Addendum)
Pt brought in by [facility] from home with c/o SOB and productive green sputum x 7 days. EMS arrived and pt was found to be 67% on RA. Pt was placed on CPAP and sats increased to 85%. Pt was also given 4 tablets of Nitro by EMS. EMS reports crackles in all lung fields. HR 150, resp 50 per EMS.

## 2020-06-01 NOTE — ED Notes (Signed)
CRITICAL VALUE ALERT  Critical Value:  Troponin 337  Date & Time Notied:  06/01/20 1310  Provider Notified: Dr. Roderic Palau  Orders Received/Actions taken: EDP notified

## 2020-06-01 NOTE — ED Notes (Signed)
Date and time results received: 06/01/20 1245   Test: lactic acied Critical Value: 3.1  Name of Provider Notified: Dr. Roderic Palau  Orders Received? Or Actions Taken?: Orders Received - See Orders for details

## 2020-06-02 ENCOUNTER — Inpatient Hospital Stay (HOSPITAL_COMMUNITY): Payer: Medicare Other

## 2020-06-02 DIAGNOSIS — U071 COVID-19: Secondary | ICD-10-CM | POA: Diagnosis not present

## 2020-06-02 DIAGNOSIS — J069 Acute upper respiratory infection, unspecified: Secondary | ICD-10-CM

## 2020-06-02 DIAGNOSIS — J1282 Pneumonia due to coronavirus disease 2019: Secondary | ICD-10-CM

## 2020-06-02 DIAGNOSIS — I340 Nonrheumatic mitral (valve) insufficiency: Secondary | ICD-10-CM

## 2020-06-02 DIAGNOSIS — I4891 Unspecified atrial fibrillation: Secondary | ICD-10-CM | POA: Diagnosis not present

## 2020-06-02 DIAGNOSIS — I361 Nonrheumatic tricuspid (valve) insufficiency: Secondary | ICD-10-CM

## 2020-06-02 DIAGNOSIS — J9601 Acute respiratory failure with hypoxia: Secondary | ICD-10-CM

## 2020-06-02 LAB — CBC WITH DIFFERENTIAL/PLATELET
Abs Immature Granulocytes: 0.17 10*3/uL — ABNORMAL HIGH (ref 0.00–0.07)
Basophils Absolute: 0 10*3/uL (ref 0.0–0.1)
Basophils Relative: 0 %
Eosinophils Absolute: 0 10*3/uL (ref 0.0–0.5)
Eosinophils Relative: 0 %
HCT: 38.3 % — ABNORMAL LOW (ref 39.0–52.0)
Hemoglobin: 13.1 g/dL (ref 13.0–17.0)
Immature Granulocytes: 1 %
Lymphocytes Relative: 5 %
Lymphs Abs: 0.8 10*3/uL (ref 0.7–4.0)
MCH: 33.2 pg (ref 26.0–34.0)
MCHC: 34.2 g/dL (ref 30.0–36.0)
MCV: 97 fL (ref 80.0–100.0)
Monocytes Absolute: 0.5 10*3/uL (ref 0.1–1.0)
Monocytes Relative: 3 %
Neutro Abs: 13.9 10*3/uL — ABNORMAL HIGH (ref 1.7–7.7)
Neutrophils Relative %: 91 %
Platelets: 285 10*3/uL (ref 150–400)
RBC: 3.95 MIL/uL — ABNORMAL LOW (ref 4.22–5.81)
RDW: 13.2 % (ref 11.5–15.5)
WBC: 15.3 10*3/uL — ABNORMAL HIGH (ref 4.0–10.5)
nRBC: 0 % (ref 0.0–0.2)

## 2020-06-02 LAB — D-DIMER, QUANTITATIVE: D-Dimer, Quant: 2.21 ug/mL-FEU — ABNORMAL HIGH (ref 0.00–0.50)

## 2020-06-02 LAB — COMPREHENSIVE METABOLIC PANEL
ALT: 36 U/L (ref 0–44)
AST: 29 U/L (ref 15–41)
Albumin: 2.6 g/dL — ABNORMAL LOW (ref 3.5–5.0)
Alkaline Phosphatase: 106 U/L (ref 38–126)
Anion gap: 11 (ref 5–15)
BUN: 23 mg/dL (ref 8–23)
CO2: 23 mmol/L (ref 22–32)
Calcium: 8.3 mg/dL — ABNORMAL LOW (ref 8.9–10.3)
Chloride: 102 mmol/L (ref 98–111)
Creatinine, Ser: 0.82 mg/dL (ref 0.61–1.24)
GFR, Estimated: >60 mL/min (ref >60)
Glucose, Bld: 137 mg/dL — ABNORMAL HIGH (ref 70–99)
Potassium: 3.8 mmol/L (ref 3.5–5.1)
Sodium: 136 mmol/L (ref 135–145)
Total Bilirubin: 0.6 mg/dL (ref 0.3–1.2)
Total Protein: 6.6 g/dL (ref 6.5–8.1)

## 2020-06-02 LAB — MAGNESIUM: Magnesium: 2.3 mg/dL (ref 1.7–2.4)

## 2020-06-02 LAB — PHOSPHORUS: Phosphorus: 3.1 mg/dL (ref 2.5–4.6)

## 2020-06-02 LAB — GLUCOSE, CAPILLARY
Glucose-Capillary: 118 mg/dL — ABNORMAL HIGH (ref 70–99)
Glucose-Capillary: 119 mg/dL — ABNORMAL HIGH (ref 70–99)
Glucose-Capillary: 124 mg/dL — ABNORMAL HIGH (ref 70–99)
Glucose-Capillary: 137 mg/dL — ABNORMAL HIGH (ref 70–99)

## 2020-06-02 LAB — FERRITIN: Ferritin: 573 ng/mL — ABNORMAL HIGH (ref 24–336)

## 2020-06-02 LAB — ECHOCARDIOGRAM COMPLETE
Area-P 1/2: 4.17 cm2
Height: 68 in
S' Lateral: 3 cm
Weight: 2640 oz

## 2020-06-02 LAB — CBG MONITORING, ED
Glucose-Capillary: 132 mg/dL — ABNORMAL HIGH (ref 70–99)
Glucose-Capillary: 135 mg/dL — ABNORMAL HIGH (ref 70–99)
Glucose-Capillary: 141 mg/dL — ABNORMAL HIGH (ref 70–99)
Glucose-Capillary: 149 mg/dL — ABNORMAL HIGH (ref 70–99)

## 2020-06-02 LAB — MRSA PCR SCREENING: MRSA by PCR: NEGATIVE

## 2020-06-02 LAB — C-REACTIVE PROTEIN: CRP: 30.6 mg/dL — ABNORMAL HIGH (ref <1.0)

## 2020-06-02 MED ORDER — METHYLPREDNISOLONE SODIUM SUCC 125 MG IJ SOLR
80.0000 mg | Freq: Two times a day (BID) | INTRAMUSCULAR | Status: DC
  Administered 2020-06-02 – 2020-06-09 (×15): 80 mg via INTRAVENOUS
  Filled 2020-06-02 (×15): qty 2

## 2020-06-02 MED ORDER — SODIUM CHLORIDE 0.9 % IV SOLN
2.0000 g | INTRAVENOUS | Status: DC
  Administered 2020-06-02 – 2020-06-03 (×2): 2 g via INTRAVENOUS
  Filled 2020-06-02 (×2): qty 20

## 2020-06-02 MED ORDER — CHLORHEXIDINE GLUCONATE CLOTH 2 % EX PADS
6.0000 | MEDICATED_PAD | Freq: Every day | CUTANEOUS | Status: DC
  Administered 2020-06-02 – 2020-06-18 (×15): 6 via TOPICAL

## 2020-06-02 MED ORDER — LORAZEPAM 2 MG/ML IJ SOLN
1.0000 mg | Freq: Once | INTRAMUSCULAR | Status: AC
  Administered 2020-06-02: 1 mg via INTRAVENOUS
  Filled 2020-06-02: qty 1

## 2020-06-02 MED ORDER — LORAZEPAM 2 MG/ML IJ SOLN
0.5000 mg | Freq: Four times a day (QID) | INTRAMUSCULAR | Status: DC | PRN
  Administered 2020-06-02 – 2020-06-03 (×3): 0.5 mg via INTRAVENOUS
  Filled 2020-06-02 (×4): qty 1

## 2020-06-02 MED ORDER — SODIUM CHLORIDE 0.9 % IV SOLN
500.0000 mg | INTRAVENOUS | Status: DC
  Administered 2020-06-02 – 2020-06-03 (×2): 500 mg via INTRAVENOUS
  Filled 2020-06-02 (×2): qty 500

## 2020-06-02 NOTE — Progress Notes (Signed)
Updated wife again on patient's condition--a little more calm after ativan, but still has some increased WOB.  Saturations 86-90%  -she spoke with family and wants full scope of care and intubation if required  DTat

## 2020-06-02 NOTE — Progress Notes (Signed)
Responded to nursing call:  Increasing agitation   Subjective: Patient is more agitated and interfering with medical therapy.  Trying to pull out IVs, oxygen, getting oob.  Difficult to redirect.  Complains of sob.  Denies cp.  Remainder ROS unobtainable  Vitals:   06/02/20 1133 06/02/20 1200 06/02/20 1201 06/02/20 1225  BP:  (!) 147/103    Pulse: (!) 141 (!) 140 (!) 39   Resp: (!) 30 (!) 39 (!) 28   Temp: 98.2 F (36.8 C) 98.1 F (36.7 C) 98.1 F (36.7 C)   TempSrc:      SpO2: 91% (!) 87% (!) 88% (!) 87%  Weight:      Height:       CV--IRRR Lung--bilateral rhonchi Abd--soft+BS/NT   Assessment/Plan: Acute metabolic encephalopathy -give additional ativan 1 mg x 1 -soft wrist restraints--continue  Acute respiratory failure -agitation also influencing oxygenation/venitilation -saturations 85-87% on NRB and Heated HFNC 40L -continue IV steroids -Actemra given previously  GOC -long discussion with wife -she is conflicted about intubation currently -she wishes to speak with her family and call us back  DTat     Orson Eva, DO [facility]

## 2020-06-02 NOTE — ED Notes (Signed)
ECHO at bedside.

## 2020-06-02 NOTE — ED Notes (Signed)
Spoke with pt's wife [name] and called back to have wife talk with pt.  Pt still attempting to get out of bed after redirecting pt and ativan given.  Pt still attempting to get out of bed, pulling at tubes.  Pt confused to place but able to state name and birthday.  Pt placed after verbal order for soft wrist restraints to bilateral wrists.  Reported that pt had pulled out IV earlier.

## 2020-06-02 NOTE — ED Notes (Signed)
Pt again attempting to get OOB, Dr Tat informed, plan to treat.

## 2020-06-02 NOTE — ED Notes (Signed)
Pt resting at moment.

## 2020-06-02 NOTE — Progress Notes (Signed)
PROGRESS NOTE  Joshua Robinson [id number] DOB: 26-Jan-1935 DOA: 06/01/2020 PCP: Redmond School, MD  Brief History:  86 year old male with no documented chronic medical problems, but is a reformed smoker of 50 pack years presenting with myalgias, coughing, and shortness of breath that began on 05/24/2020.  The patient states that he visited his PCP on a virtual visit on 05/27/2019.  He was given oral steroids and azithromycin.  His symptoms did not improve.  He continued to have worsening shortness of breath, coughing.  He did have some intermittent chest discomfort with coughing but denied any hemoptysis.  He had some loose stools but did not have any hematochezia or melena.  He also had subjective fevers and chills at home.  He has had 2 doses of the Moderna vaccination but has not been boosted.  EMS was activated and the patient was noted to have oxygen saturation 67% on room air.  He was placed on CPAP and brought to the hospital.  In the emergency department, the patient was subsequently placed on high flow nasal cannula.  He did not tolerate it well and was placed on BiPAP.  He has been subsequently transitioned to heated high flow at 40 L.  The patient was given Actemra and started on intravenous steroids. In addition, the patient was noted to have atrial fibrillation with RVR.  He was started on a diltiazem drip.  Assessment/Plan: Acute respiratory failure with hypoxia secondary to COVID-19 -Currently on 4 L heated high flow -Continue intravenous Solu-Medrol--increased to 1 mg/kg every 12 -D-dimer 2.95>> 2.21 -CRP 36.4 -Ferritin 528 -PCT 0.36 -Personally reviewed chest x-ray--diffuse bilateral opacities, requiring left -06/02/2020 CTA chest--negative PE, diffuse groundglass opacities and consolidative opacities -Vitamin C and zinc -Prone positioning -Start ceftriaxone and azithromycin secondary to increasing PCT -Follow blood cultures  Atrial fibrillation with  RVR -Continue diltiazem drip for now -Transition to oral diltiazem once respiratory status has stabilized and patient can reliably take p.o. -Echocardiogram -TSH -CHADS-VASc= 3 (age, ASVD) -Start apixaban -Patient reviewed EKG--atrial fibrillation, nonspecific ST-T wave change  Hyponatremia -Secondary to volume depletion and poor solute intake -Continue judicious IV fluids  Elevated troponin -Secondary to demand ischemia due to acute medical illness as well as atrial fibrillation -No chest pain presently -Echocardiogram  Hyperglycemia -Likely steroid-induced -Check A1c -NovoLog sliding scale  Presumptive COPD -Patient has had 50-pack-year history of tobacco -Quit tobacco 20 years ago -Continue Combivent     Status is: Inpatient  Remains inpatient appropriate because:IV treatments appropriate due to intensity of illness or inability to take PO   Dispo: The patient is from: Home              Anticipated d/c is to: Home              Anticipated d/c date is: 3 days              Patient currently is not medically stable to d/c.        Family Communication:   Family at bedside  Consultants:  none  Code Status:  FULL-confirmed with patient  DVT Prophylaxis:  apixaban   Procedures: As Listed in Progress Note Above  Antibiotics: Ceftriaxone 1/16>> azithro 1/16>> vanco 1/16>>  The patient is critically ill with multiple organ systems failure and requires high complexity decision making for assessment and support, frequent evaluation and titration of therapies, application of advanced monitoring technologies and extensive interpretation of multiple databases.  Critical care time -  35 mins.       Subjective: Patient states his breathing is slightly better than yesterday.  He still has cough.  Denies cp, n/v/d, abd pain, dysuria, hematochezia, hemoptysis.  No abd pain.  Objective: Vitals:   06/02/20 0230 06/02/20 0300 06/02/20 0330 06/02/20 0400  BP:  (!) 116/95 118/77 101/73 107/75  Pulse: (!) 59 (!) 113 (!) 107 (!) 101  Resp: (!) 29 (!) 25 (!) 28 (!) 24  Temp: 97.7 F (36.5 C) 97.7 F (36.5 C) 97.7 F (36.5 C) 97.9 F (36.6 C)  TempSrc:      SpO2: 90% 90% 90% 91%  Weight:      Height:        Intake/Output Summary (Last 24 hours) at 06/02/2020 0725 Last data filed at 06/02/2020 0256 Gross per 24 hour  Intake 1783.15 ml  Output 725 ml  Net 1058.15 ml   Weight change:  Exam:   General:  Pt is alert, follows commands appropriately, not in acute distress  HEENT: No icterus, No thrush, No neck mass, [place]/AT  Cardiovascular: IRRR, S1/S2, no rubs, no gallops  Respiratory: bilateral rales. No wheeze  Abdomen: Soft/+BS, non tender, non distended, no guarding  Extremities: No edema, No lymphangitis, No petechiae, No rashes, no synovitis   Data Reviewed: I have personally reviewed following labs and imaging studies Basic Metabolic Panel: Recent Labs  Lab 06/01/20 1215 06/02/20 0430  NA 132* 136  K 4.0 3.8  CL 98 102  CO2 23 23  GLUCOSE 127* 137*  BUN 22 23  CREATININE 0.98 0.82  CALCIUM 8.6* 8.3*  MG  --  2.3  PHOS  --  3.1   Liver Function Tests: Recent Labs  Lab 06/01/20 1215 06/02/20 0430  AST 38 29  ALT 41 36  ALKPHOS 124 106  BILITOT 0.8 0.6  PROT 7.7 6.6  ALBUMIN 3.1* 2.6*   No results for input(s): LIPASE, AMYLASE in the last 168 hours. No results for input(s): AMMONIA in the last 168 hours. Coagulation Profile: Recent Labs  Lab 06/01/20 1215  INR 1.2   CBC: Recent Labs  Lab 06/01/20 1215 06/02/20 0430  WBC 22.3* 15.3*  NEUTROABS 19.9* 13.9*  HGB 14.2 13.1  HCT 40.8 38.3*  MCV 97.1 97.0  PLT 368 285   Cardiac Enzymes: No results for input(s): CKTOTAL, CKMB, CKMBINDEX, TROPONINI in the last 168 hours. BNP: Invalid input(s): POCBNP CBG: Recent Labs  Lab 06/01/20 1615 06/01/20 2012 06/02/20 0036 06/02/20 0434  GLUCAP 123* 163* 149* 132*   HbA1C: No results for input(s):  HGBA1C in the last 72 hours. Urine analysis:    Component Value Date/Time   COLORURINE YELLOW 06/01/2020 1215   APPEARANCEUR HAZY (A) 06/01/2020 1215   LABSPEC 1.026 06/01/2020 1215   PHURINE 5.0 06/01/2020 1215   GLUCOSEU NEGATIVE 06/01/2020 1215   HGBUR NEGATIVE 06/01/2020 1215   BILIRUBINUR NEGATIVE 06/01/2020 1215   KETONESUR 5 (A) 06/01/2020 1215   PROTEINUR 100 (A) 06/01/2020 1215   UROBILINOGEN 0.2 02/12/2013 1150   NITRITE NEGATIVE 06/01/2020 1215   LEUKOCYTESUR NEGATIVE 06/01/2020 1215   Sepsis Labs: @LABRCNTIP (procalcitonin:4,lacticidven:4) ) Recent Results (from the past 240 hour(s))  MRSA PCR Screening     Status: None   Collection Time: 06/01/20 12:40 PM   Specimen: Nasopharyngeal  Result Value Ref Range Status   MRSA by PCR NEGATIVE NEGATIVE Final    Comment:        The GeneXpert MRSA Assay (FDA approved for NASAL specimens only), is one component  of a comprehensive MRSA colonization surveillance program. It is not intended to diagnose MRSA infection nor to guide or monitor treatment for MRSA infections. Performed at [facility], [address] [place], [place] [address]      Scheduled Meds: . vitamin C  500 mg Oral Daily  . enoxaparin (LOVENOX) injection  30 mg Subcutaneous Q12H  . folic acid  1 mg Oral Daily  . insulin aspart  0-9 Units Subcutaneous Q4H  . Ipratropium-Albuterol  1 puff Inhalation Q6H  . methylPREDNISolone (SOLU-MEDROL) injection  80 mg Intravenous Q12H  . multivitamin with minerals  1 tablet Oral Daily  . pantoprazole (PROTONIX) IV  40 mg Intravenous QHS  . sodium chloride flush  3 mL Intravenous Q12H  . sodium chloride flush  3 mL Intravenous Q12H  . thiamine  100 mg Oral Daily  . zinc sulfate  220 mg Oral Daily   Continuous Infusions: . sodium chloride    . sodium chloride 50 mL/hr at 06/02/20 0256  . azithromycin    . cefTRIAXone (ROCEPHIN)  IV    . diltiazem (CARDIZEM) infusion 5 mg/hr (06/02/20 0255)  . sodium chloride     . vancomycin 750 mg (06/02/20 0601)    Procedures/Studies: CT Angio Chest PE W and/or Wo Contrast  Result Date: 06/01/2020 CLINICAL DATA:  cough, COVID+, elevated d dimer EXAM: CT ANGIOGRAPHY CHEST WITH CONTRAST TECHNIQUE: Multidetector CT imaging of the chest was performed using the standard protocol during bolus administration of intravenous contrast. Multiplanar CT image reconstructions and MIPs were obtained to evaluate the vascular anatomy. CONTRAST:  75mL OMNIPAQUE IOHEXOL 350 MG/ML SOLN COMPARISON:  Xr chest 06/01/20, xr chest 02/12/13 FINDINGS: Cardiovascular: Satisfactory opacification of the pulmonary arteries to the segmental level. No evidence of pulmonary embolism. Enlarged right atria. Otherwise heart size. No significant pericardial effusion. The thoracic aorta is normal in caliber. Mild atherosclerotic plaque of the thoracic aorta. Mild 2 vessel coronary artery calcifications. Mediastinum/Nodes: No enlarged mediastinal, hilar, or axillary lymph nodes. Thyroid gland, trachea, and esophagus demonstrate no significant findings. Lungs/Pleura: Diffuse ground glass and consolidative opacities involving the majority of the lungs. Limited evaluation for pulmonary nodule due to minimal aerated lung. No definite pulmonary mass. No pleural effusion. No pneumothorax. Upper Abdomen: No acute abnormality. Musculoskeletal: No chest wall abnormality No suspicious lytic or blastic osseous lesions. No acute displaced fracture. Multilevel degenerative changes of the spine. Review of the MIP images confirms the above findings. IMPRESSION: 1. No pulmonary embolus. 2. Diffuse multifocal pneumonia in the setting of COVID-19 infection. 3.  Aortic Atherosclerosis (ICD10-I70.0). Electronically Signed   By: Tish FredericksonMorgane  Naveau M.D.   On: 06/01/2020 15:26   DG Chest Port 1 View  Result Date: 06/01/2020 CLINICAL DATA:  PT on Bipap unable to give hx. Positive Covid test. Hx of CA. ER notes:Pt brought in by [facility] from home  with c/o SOB and productive green sputum x 7 days. EMS arrived and pt was found to be 67% on RA. Pt was placed on CPAP and sats increased to 85%. Pt was also given 4 tablets of Nitro by EMS. EMS reports crackles in all lung fields. HR 150, resp 50 per EMS EXAM: PORTABLE CHEST 1 VIEW COMPARISON:  02/12/2013 FINDINGS: Bilateral hazy airspace lung opacities are noted consistent multifocal pneumonia. No evidence of pulmonary edema. No pleural effusion or pneumothorax. Cardiac silhouette is normal in size. No mediastinal or hilar masses. Skeletal structures are grossly intact. IMPRESSION: Bilateral hazy airspace lung opacities consistent with multifocal pneumonia, pattern consistent with  atypical/viral infection including COVID 19. Electronically Signed   By: Lajean Manes M.D.   On: 06/01/2020 12:37    Orson Eva, DO  [facility]  If 7PM-7AM, please contact night-coverage [email or web address] Password [email or web address] 06/02/2020, 7:25 AM   LOS: 1 day

## 2020-06-02 NOTE — Progress Notes (Signed)
Patient removed from BIPAP after observing from staff patient is alert and able to understand commands. Patient was placed on Heated High Flow [place] at 100% with 35L to maintain saturations in mid 90s.  Rt and nursing will continue to monitor and BIPAP is on standby.

## 2020-06-02 NOTE — Progress Notes (Signed)
*  PRELIMINARY RESULTS* Echocardiogram 2D Echocardiogram has been performed.  Joshua Robinson 06/02/2020, 11:02 AM

## 2020-06-02 NOTE — ED Notes (Signed)
Pt found getting out of bed.  Easily re-directed.  Pull IV out to left ac.

## 2020-06-02 NOTE — Progress Notes (Signed)
Spoke briefly with Joshua Robinson for support. Prayed with her for her husband, for peace and grace for this time. She shared she is accepting of what God has for him and yet hoping he will get better.  Will continue to offer support.

## 2020-06-03 DIAGNOSIS — Z7189 Other specified counseling: Secondary | ICD-10-CM

## 2020-06-03 DIAGNOSIS — U071 COVID-19: Secondary | ICD-10-CM | POA: Diagnosis not present

## 2020-06-03 DIAGNOSIS — I4891 Unspecified atrial fibrillation: Secondary | ICD-10-CM | POA: Diagnosis not present

## 2020-06-03 DIAGNOSIS — Z515 Encounter for palliative care: Secondary | ICD-10-CM

## 2020-06-03 DIAGNOSIS — J1282 Pneumonia due to coronavirus disease 2019: Secondary | ICD-10-CM | POA: Diagnosis not present

## 2020-06-03 DIAGNOSIS — J069 Acute upper respiratory infection, unspecified: Secondary | ICD-10-CM | POA: Diagnosis not present

## 2020-06-03 LAB — PHOSPHORUS: Phosphorus: 4.1 mg/dL (ref 2.5–4.6)

## 2020-06-03 LAB — COMPREHENSIVE METABOLIC PANEL
ALT: 37 U/L (ref 0–44)
AST: 25 U/L (ref 15–41)
Albumin: 2.6 g/dL — ABNORMAL LOW (ref 3.5–5.0)
Alkaline Phosphatase: 112 U/L (ref 38–126)
Anion gap: 11 (ref 5–15)
BUN: 35 mg/dL — ABNORMAL HIGH (ref 8–23)
CO2: 24 mmol/L (ref 22–32)
Calcium: 8.2 mg/dL — ABNORMAL LOW (ref 8.9–10.3)
Chloride: 105 mmol/L (ref 98–111)
Creatinine, Ser: 0.8 mg/dL (ref 0.61–1.24)
GFR, Estimated: >60 mL/min (ref >60)
Glucose, Bld: 148 mg/dL — ABNORMAL HIGH (ref 70–99)
Potassium: 3.9 mmol/L (ref 3.5–5.1)
Sodium: 140 mmol/L (ref 135–145)
Total Bilirubin: 0.6 mg/dL (ref 0.3–1.2)
Total Protein: 6.3 g/dL — ABNORMAL LOW (ref 6.5–8.1)

## 2020-06-03 LAB — CBC WITH DIFFERENTIAL/PLATELET
Abs Immature Granulocytes: 0.2 10*3/uL — ABNORMAL HIGH (ref 0.00–0.07)
Basophils Absolute: 0.1 10*3/uL (ref 0.0–0.1)
Basophils Relative: 0 %
Eosinophils Absolute: 0 10*3/uL (ref 0.0–0.5)
Eosinophils Relative: 0 %
HCT: 39.8 % (ref 39.0–52.0)
Hemoglobin: 13.1 g/dL (ref 13.0–17.0)
Immature Granulocytes: 1 %
Lymphocytes Relative: 4 %
Lymphs Abs: 0.7 10*3/uL (ref 0.7–4.0)
MCH: 33.1 pg (ref 26.0–34.0)
MCHC: 32.9 g/dL (ref 30.0–36.0)
MCV: 100.5 fL — ABNORMAL HIGH (ref 80.0–100.0)
Monocytes Absolute: 0.5 10*3/uL (ref 0.1–1.0)
Monocytes Relative: 3 %
Neutro Abs: 15.7 10*3/uL — ABNORMAL HIGH (ref 1.7–7.7)
Neutrophils Relative %: 92 %
Platelets: 296 10*3/uL (ref 150–400)
RBC: 3.96 MIL/uL — ABNORMAL LOW (ref 4.22–5.81)
RDW: 13.4 % (ref 11.5–15.5)
WBC: 17.2 10*3/uL — ABNORMAL HIGH (ref 4.0–10.5)
nRBC: 0 % (ref 0.0–0.2)

## 2020-06-03 LAB — GLUCOSE, CAPILLARY
Glucose-Capillary: 135 mg/dL — ABNORMAL HIGH (ref 70–99)
Glucose-Capillary: 135 mg/dL — ABNORMAL HIGH (ref 70–99)
Glucose-Capillary: 132 mg/dL — ABNORMAL HIGH (ref 70–99)
Glucose-Capillary: 142 mg/dL — ABNORMAL HIGH (ref 70–99)
Glucose-Capillary: 137 mg/dL — ABNORMAL HIGH (ref 70–99)
Glucose-Capillary: 123 mg/dL — ABNORMAL HIGH (ref 70–99)

## 2020-06-03 LAB — FERRITIN: Ferritin: 509 ng/mL — ABNORMAL HIGH (ref 24–336)

## 2020-06-03 LAB — T4, FREE: Free T4: 1.6 ng/dL — ABNORMAL HIGH (ref 0.61–1.12)

## 2020-06-03 LAB — URINE CULTURE: Culture: NO GROWTH

## 2020-06-03 LAB — PROCALCITONIN: Procalcitonin: 0.19 ng/mL

## 2020-06-03 LAB — MAGNESIUM: Magnesium: 2.7 mg/dL — ABNORMAL HIGH (ref 1.7–2.4)

## 2020-06-03 LAB — TSH: TSH: 0.124 u[IU]/mL — ABNORMAL LOW (ref 0.350–4.500)

## 2020-06-03 LAB — D-DIMER, QUANTITATIVE: D-Dimer, Quant: 16.67 ug/mL-FEU — ABNORMAL HIGH (ref 0.00–0.50)

## 2020-06-03 LAB — HEMOGLOBIN A1C
Hgb A1c MFr Bld: 5.1 % (ref 4.8–5.6)
Mean Plasma Glucose: 99.67 mg/dL

## 2020-06-03 LAB — C-REACTIVE PROTEIN: CRP: 18.9 mg/dL — ABNORMAL HIGH (ref <1.0)

## 2020-06-03 MED ORDER — ENOXAPARIN SODIUM 80 MG/0.8ML ~~LOC~~ SOLN
80.0000 mg | Freq: Two times a day (BID) | SUBCUTANEOUS | Status: DC
  Administered 2020-06-03 – 2020-06-09 (×12): 80 mg via SUBCUTANEOUS
  Filled 2020-06-03 (×12): qty 0.8

## 2020-06-03 MED ORDER — THIAMINE HCL 100 MG/ML IJ SOLN
100.0000 mg | Freq: Every day | INTRAMUSCULAR | Status: DC
  Administered 2020-06-03 – 2020-06-18 (×16): 100 mg via INTRAVENOUS
  Filled 2020-06-03 (×15): qty 2

## 2020-06-03 MED ORDER — FOLIC ACID 5 MG/ML IJ SOLN
1.0000 mg | Freq: Every day | INTRAMUSCULAR | Status: DC
  Administered 2020-06-03 – 2020-06-18 (×15): 1 mg via INTRAVENOUS
  Filled 2020-06-03 (×14): qty 0.2

## 2020-06-03 NOTE — Progress Notes (Signed)
PROGRESS NOTE  Joshua Robinson [id number] DOB: [birth year] DOA: 06/01/2020 PCP: Redmond School, MD  Brief History:  86 year old male with no documented chronic medical problems, but is a reformed smoker of 50 pack years presenting with myalgias, coughing, and shortness of breath that began on 05/24/2020.  The patient states that he visited his PCP on a virtual visit on 05/27/2019.  He was given oral steroids and azithromycin.  His symptoms did not improve.  He continued to have worsening shortness of breath, coughing.  He did have some intermittent chest discomfort with coughing but denied any hemoptysis.  He had some loose stools but did not have any hematochezia or melena.  He also had subjective fevers and chills at home.  He has had 2 doses of the Moderna vaccination but has not been boosted.  EMS was activated and the patient was noted to have oxygen saturation 67% on room air.  He was placed on CPAP and brought to the hospital.  In the emergency department, the patient was subsequently placed on high flow nasal cannula.  He did not tolerate it well and was placed on BiPAP.  He has been subsequently transitioned to heated high flow at 40 L.  The patient was given Actemra and started on intravenous steroids. In addition, the patient was noted to have atrial fibrillation with RVR.  He was started on a diltiazem drip.  Assessment/Plan: Acute respiratory failure with hypoxia secondary to COVID-19 -4 L Heated HFNC>>BiPAP at 100% FiO2 with saturations 88-92% -Continue intravenous Solu-Medrol--increased to 1 mg/kg every 12 -received Actemra on 06/01/20 -D-dimer 2.95>> 2.21>>16.67 -CRP 36.4>>18.9 -Ferritin 528>>509 -PCT 0.36>>0.19 -Personally reviewed chest x-ray--diffuse bilateral opacities, requiring left -06/02/2020 CTA chest--negative PE, diffuse groundglass opacities and consolidative opacities -Vitamin C and zinc -Prone positioning -Short course ceftriaxone and  azithromycin -Follow blood cultures--neg to date -morphine and ativan judiciously to help with agitation  Atrial fibrillation with RVR -Continue diltiazem drip for now -Transition to oral diltiazem once respiratory status has stabilized and patient can reliably take p.o. -Echo--EF 50-55%, no WMA, RVSP 50 -TSH--0.124 -CHADS-VASc= 3 (age, ASVD) -Start treatment dose lovenox -Patient reviewed EKG--atrial fibrillation, nonspecific ST-T wave change  Hyponatremia -Secondary to volume depletion and poor solute intake -Continue judicious IV fluids>>improved  Elevated troponin -Secondary to demand ischemia due to acute medical illness as well as atrial fibrillation -No chest pain presently -Echocardiogram--EF 50-55%, no WMA, moderately elevated PASP (50), mod TR  Hyperglycemia -Likely steroid-induced -Check A1c -NovoLog sliding scale  Presumptive COPD -Patient has had 50-pack-year history of tobacco -Quit tobacco 20 years ago -Continue Combivent  GOC -Advance care planning, including the explanation and discussion of advance directives was carried out with the patient and family.  Code status including explanations of "Full Code" and "DNR" and alternatives were discussed in detail.  Discussion of end-of-life issues including but not limited palliative care, hospice care and the concept of hospice, other end-of-life care options, power of attorney for health care decisions, living wills, and physician orders for life-sustaining treatment were also discussed with the patient and family.  Total face to face time 16 minutes. -spouse wants full scope of care -palliative medicine consult     Status is: Inpatient  Remains inpatient appropriate because:IV treatments appropriate due to intensity of illness or inability to take PO   Dispo: The patient is from: Home  Anticipated d/c is to: Home  Anticipated d/c date is: 3 days  Patient  currently is  not medically stable to d/c.        Family Communication:  spouse updated 06/03/20  Consultants:  none  Code Status:  FULL-confirmed with patient  DVT Prophylaxis:  lovenox treatment dose   Procedures: As Listed in Progress Note Above  Antibiotics: Ceftriaxone 1/16>> azithro 1/16>> vanco 1/16  The patient is critically ill with multiple organ systems failure and requires high complexity decision making for assessment and support, frequent evaluation and titration of therapies, application of advanced monitoring technologies and extensive interpretation of multiple databases.  Critical care time - 35 mins.      Subjective: Patient is sedated on BiPAP.  No vomiting, diarrhea.  No uncontrolled pain.  Remainder ROS unobtainable  Objective: Vitals:   06/03/20 1045 06/03/20 1100 06/03/20 1115 06/03/20 1230  BP: (!) 115/59 (!) 125/53 108/66 123/63  Pulse: 92 70 (!) 119 77  Resp: (!) 21 (!) 22 (!) 30 (!) 25  Temp: (!) 97.34 F (36.3 C) (!) 97.34 F (36.3 C) (!) 97.52 F (36.4 C)   TempSrc:      SpO2: 96% 99% 92% 96%  Weight:      Height:        Intake/Output Summary (Last 24 hours) at 06/03/2020 1356 Last data filed at 06/03/2020 0330 Gross per 24 hour  Intake 250 ml  Output 525 ml  Net -275 ml   Weight change: 2.056 kg Exam:   General:  Pt is sedated on BiPAP; no distress  HEENT: No icterus, No thrush, No neck mass, [place]/AT  Cardiovascular: RRR, S1/S2, no rubs, no gallops  Respiratory: bilateral rales. No wheeze  Abdomen: Soft/+BS, non tender, non distended, no guarding  Extremities: No edema, No lymphangitis, No petechiae, No rashes, no synovitis   Data Reviewed: I have personally reviewed following labs and imaging studies Basic Metabolic Panel: Recent Labs  Lab 06/01/20 1215 06/02/20 0430 06/03/20 0521  NA 132* 136 140  K 4.0 3.8 3.9  CL 98 102 105  CO2 23 23 24   GLUCOSE 127* 137* 148*  BUN 22 23 35*   CREATININE 0.98 0.82 0.80  CALCIUM 8.6* 8.3* 8.2*  MG  --  2.3 2.7*  PHOS  --  3.1 4.1   Liver Function Tests: Recent Labs  Lab 06/01/20 1215 06/02/20 0430 06/03/20 0521  AST 38 29 25  ALT 41 36 37  ALKPHOS 124 106 112  BILITOT 0.8 0.6 0.6  PROT 7.7 6.6 6.3*  ALBUMIN 3.1* 2.6* 2.6*   No results for input(s): LIPASE, AMYLASE in the last 168 hours. No results for input(s): AMMONIA in the last 168 hours. Coagulation Profile: Recent Labs  Lab 06/01/20 1215  INR 1.2   CBC: Recent Labs  Lab 06/01/20 1215 06/02/20 0430 06/03/20 0521  WBC 22.3* 15.3* 17.2*  NEUTROABS 19.9* 13.9* 15.7*  HGB 14.2 13.1 13.1  HCT 40.8 38.3* 39.8  MCV 97.1 97.0 100.5*  PLT 368 285 296   Cardiac Enzymes: No results for input(s): CKTOTAL, CKMB, CKMBINDEX, TROPONINI in the last 168 hours. BNP: Invalid input(s): POCBNP CBG: Recent Labs  Lab 06/02/20 2032 06/02/20 2333 06/03/20 0357 06/03/20 0909 06/03/20 1218  GLUCAP 124* 118* 135* 135* 132*   HbA1C: Recent Labs    06/03/20 0521  HGBA1C 5.1   Urine analysis:    Component Value Date/Time   COLORURINE YELLOW 06/01/2020 1215   APPEARANCEUR HAZY (A) 06/01/2020 1215   LABSPEC 1.026 06/01/2020 1215   PHURINE 5.0 06/01/2020 1215   GLUCOSEU NEGATIVE 06/01/2020 1215   HGBUR  NEGATIVE 06/01/2020 1215   BILIRUBINUR NEGATIVE 06/01/2020 1215   KETONESUR 5 (A) 06/01/2020 1215   PROTEINUR 100 (A) 06/01/2020 1215   UROBILINOGEN 0.2 02/12/2013 1150   NITRITE NEGATIVE 06/01/2020 1215   LEUKOCYTESUR NEGATIVE 06/01/2020 1215   Sepsis Labs: @LABRCNTIP (procalcitonin:4,lacticidven:4) ) Recent Results (from the past 240 hour(s))  Blood culture (routine single)     Status: None (Preliminary result)   Collection Time: 06/01/20 12:15 PM   Specimen: Right Antecubital; Blood  Result Value Ref Range Status   Specimen Description   Final    RIGHT ANTECUBITAL BOTTLES DRAWN AEROBIC AND ANAEROBIC   Special Requests Blood Culture adequate volume   Final   Culture   Final    NO GROWTH 2 DAYS Performed at [facility], [address] [place], [place] [address]    Report Status PENDING  Incomplete  Urine culture     Status: None   Collection Time: 06/01/20 12:15 PM   Specimen: In/Out Cath Urine  Result Value Ref Range Status   Specimen Description   Final    IN/OUT CATH URINE Performed at [facility], [address] [place], [place] [address]    Special Requests   Final    NONE Performed at [facility], [address] [place], [place] [address]    Culture   Final    NO GROWTH Performed at [facility], [place] [address], [place], [place] [address]    Report Status 06/03/2020 FINAL  Final  Blood Culture (routine x 2)     Status: None (Preliminary result)   Collection Time: 06/01/20 12:30 PM   Specimen: BLOOD RIGHT FOREARM  Result Value Ref Range Status   Specimen Description   Final    BLOOD RIGHT FOREARM BOTTLES DRAWN AEROBIC AND ANAEROBIC   Special Requests Blood Culture adequate volume  Final   Culture   Final    NO GROWTH 2 DAYS Performed at [facility], [address], [place], [place] [address]    Report Status PENDING  Incomplete  MRSA PCR Screening     Status: None   Collection Time: 06/01/20 12:40 PM   Specimen: Nasopharyngeal  Result Value Ref Range Status   MRSA by PCR NEGATIVE NEGATIVE Final    Comment:        The GeneXpert MRSA Assay (FDA approved for NASAL specimens only), is one component of a comprehensive MRSA colonization surveillance program. It is not intended to diagnose MRSA infection nor to guide or monitor treatment for MRSA infections. Performed at [facility], [address], [place], [place] [address]   MRSA PCR Screening     Status: None   Collection Time: 06/02/20  2:01 PM   Specimen: Nasopharyngeal  Result Value Ref Range Status   MRSA by PCR NEGATIVE NEGATIVE Final    Comment:        The GeneXpert MRSA Assay (FDA approved for NASAL specimens only), is one component of  a comprehensive MRSA colonization surveillance program. It is not intended to diagnose MRSA infection nor to guide or monitor treatment for MRSA infections. Performed at [facility], [address], [place], [place] [address]      Scheduled Meds: . vitamin C  500 mg Oral Daily  . Chlorhexidine Gluconate Cloth  6 each Topical Daily  . enoxaparin (LOVENOX) injection  30 mg Subcutaneous Q12H  . folic acid  1 mg Oral Daily  . insulin aspart  0-9 Units Subcutaneous Q4H  . Ipratropium-Albuterol  1 puff Inhalation Q6H  .  methylPREDNISolone (SOLU-MEDROL) injection  80 mg Intravenous Q12H  . multivitamin with minerals  1 tablet Oral Daily  . pantoprazole (PROTONIX) IV  40 mg Intravenous QHS  . sodium chloride flush  3 mL Intravenous Q12H  . sodium chloride flush  3 mL Intravenous Q12H  . thiamine  100 mg Oral Daily  . zinc sulfate  220 mg Oral Daily   Continuous Infusions: . sodium chloride    . azithromycin 500 mg (06/03/20 0955)  . cefTRIAXone (ROCEPHIN)  IV 2 g (06/03/20 0955)  . diltiazem (CARDIZEM) infusion 10 mg/hr (06/02/20 2000)  . sodium chloride      Procedures/Studies: CT Angio Chest PE W and/or Wo Contrast  Result Date: 06/01/2020 CLINICAL DATA:  cough, COVID+, elevated d dimer EXAM: CT ANGIOGRAPHY CHEST WITH CONTRAST TECHNIQUE: Multidetector CT imaging of the chest was performed using the standard protocol during bolus administration of intravenous contrast. Multiplanar CT image reconstructions and MIPs were obtained to evaluate the vascular anatomy. CONTRAST:  66mL OMNIPAQUE IOHEXOL 350 MG/ML SOLN COMPARISON:  Xr chest 06/01/20, xr chest 02/12/13 FINDINGS: Cardiovascular: Satisfactory opacification of the pulmonary arteries to the segmental level. No evidence of pulmonary embolism. Enlarged right atria. Otherwise heart size. No significant pericardial effusion. The thoracic aorta is normal in caliber. Mild atherosclerotic plaque of the thoracic aorta. Mild 2 vessel coronary  artery calcifications. Mediastinum/Nodes: No enlarged mediastinal, hilar, or axillary lymph nodes. Thyroid gland, trachea, and esophagus demonstrate no significant findings. Lungs/Pleura: Diffuse ground glass and consolidative opacities involving the majority of the lungs. Limited evaluation for pulmonary nodule due to minimal aerated lung. No definite pulmonary mass. No pleural effusion. No pneumothorax. Upper Abdomen: No acute abnormality. Musculoskeletal: No chest wall abnormality No suspicious lytic or blastic osseous lesions. No acute displaced fracture. Multilevel degenerative changes of the spine. Review of the MIP images confirms the above findings. IMPRESSION: 1. No pulmonary embolus. 2. Diffuse multifocal pneumonia in the setting of COVID-19 infection. 3.  Aortic Atherosclerosis (ICD10-I70.0). Electronically Signed   By: Iven Finn M.D.   On: 06/01/2020 15:26   DG Chest Port 1 View  Result Date: 06/01/2020 CLINICAL DATA:  PT on Bipap unable to give hx. Positive Covid test. Hx of CA. ER notes:Pt brought in by [facility] from home with c/o SOB and productive green sputum x 7 days. EMS arrived and pt was found to be 67% on RA. Pt was placed on CPAP and sats increased to 85%. Pt was also given 4 tablets of Nitro by EMS. EMS reports crackles in all lung fields. HR 150, resp 50 per EMS EXAM: PORTABLE CHEST 1 VIEW COMPARISON:  02/12/2013 FINDINGS: Bilateral hazy airspace lung opacities are noted consistent multifocal pneumonia. No evidence of pulmonary edema. No pleural effusion or pneumothorax. Cardiac silhouette is normal in size. No mediastinal or hilar masses. Skeletal structures are grossly intact. IMPRESSION: Bilateral hazy airspace lung opacities consistent with multifocal pneumonia, pattern consistent with atypical/viral infection including COVID 19. Electronically Signed   By: Lajean Manes M.D.   On: 06/01/2020 12:37   ECHOCARDIOGRAM COMPLETE  Result Date: 06/02/2020    ECHOCARDIOGRAM REPORT    Patient Name:   LADARIUS MASSARI Date of Exam: 06/02/2020 Medical Rec #:  [id number]         Height:       68.0 in Accession #:    [id number]        Weight:       165.0 lb Date of Birth:  October 16, 1934  BSA:          1.883 m Patient Age:    55 years          BP:           107/75 mmHg Patient Gender: M                 HR:           101 bpm. Exam Location:  [facility] Procedure: 2D Echo, Cardiac Doppler and Color Doppler Indications:    Dyspnea R06.00  History:        Patient has no prior history of Echocardiogram examinations.                 Arrythmias:Atrial Fibrillation. Pneumonia due to COVID-19 virus.  Sonographer:    Alvino Chapel RCS Referring Phys: [phone] COURAGE EMOKPAE IMPRESSIONS  1. Left ventricular ejection fraction, by estimation, is 50 to 55%. The left ventricle has low normal function. The left ventricle has no regional wall motion abnormalities. Left ventricular diastolic parameters are indeterminate.  2. Right ventricular systolic function is normal. The right ventricular size is normal. There is moderately elevated pulmonary artery systolic pressure. The estimated right ventricular systolic pressure is Q000111Q mmHg.  3. Left atrial size was mildly dilated.  4. Right atrial size was moderately dilated.  5. There is a trivial pericardial effusion posterior to the left ventricle.  6. The mitral valve is grossly normal. Mild mitral valve regurgitation.  7. Tricuspid valve regurgitation is moderate.  8. The aortic valve is tricuspid. There is mild calcification of the aortic valve. Aortic valve regurgitation is not visualized.  9. The inferior vena cava is dilated in size with <50% respiratory variability, suggesting right atrial pressure of 15 mmHg. FINDINGS  Left Ventricle: Left ventricular ejection fraction, by estimation, is 50 to 55%. The left ventricle has low normal function. The left ventricle has no regional wall motion abnormalities. The left ventricular internal cavity size was normal in  size. There is no left ventricular hypertrophy. Left ventricular diastolic parameters are indeterminate. Right Ventricle: The right ventricular size is normal. No increase in right ventricular wall thickness. Right ventricular systolic function is normal. There is moderately elevated pulmonary artery systolic pressure. The tricuspid regurgitant velocity is 2.96 m/s, and with an assumed right atrial pressure of 15 mmHg, the estimated right ventricular systolic pressure is Q000111Q mmHg. Left Atrium: Left atrial size was mildly dilated. Right Atrium: Right atrial size was moderately dilated. Pericardium: Trivial pericardial effusion is present. The pericardial effusion is posterior to the left ventricle. Mitral Valve: The mitral valve is grossly normal. Mild to moderate mitral annular calcification. Mild mitral valve regurgitation. Tricuspid Valve: The tricuspid valve is grossly normal. Tricuspid valve regurgitation is moderate. Aortic Valve: The aortic valve is tricuspid. There is mild calcification of the aortic valve. There is mild to moderate aortic valve annular calcification. Aortic valve regurgitation is not visualized. Pulmonic Valve: The pulmonic valve was grossly normal. Pulmonic valve regurgitation is trivial. Aorta: The aortic root is normal in size and structure. Venous: The inferior vena cava is dilated in size with less than 50% respiratory variability, suggesting right atrial pressure of 15 mmHg. IAS/Shunts: No atrial level shunt detected by color flow Doppler.  LEFT VENTRICLE PLAX 2D LVIDd:         4.50 cm LVIDs:         3.00 cm LV PW:         0.90 cm LV IVS:  1.00 cm LVOT diam:     2.10 cm LV SV:         42 LV SV Index:   22 LVOT Area:     3.46 cm  RIGHT VENTRICLE TAPSE (M-mode): 1.4 cm LEFT ATRIUM             Index       RIGHT ATRIUM           Index LA diam:        3.80 cm 2.02 cm/m  RA Area:     25.60 cm LA Vol (A2C):   59.9 ml 31.81 ml/m RA Volume:   86.30 ml  45.82 ml/m LA Vol (A4C):    66.5 ml 35.31 ml/m LA Biplane Vol: 67.7 ml 35.95 ml/m  AORTIC VALVE LVOT Vmax:   64.30 cm/s LVOT Vmean:  39.700 cm/s LVOT VTI:    0.120 m  AORTA Ao Root diam: 3.60 cm MITRAL VALVE                TRICUSPID VALVE MV Area (PHT): 4.17 cm     TR Peak grad:   35.0 mmHg MV Decel Time: 182 msec     TR Vmax:        296.00 cm/s MV E velocity: 139.00 cm/s                             SHUNTS                             Systemic VTI:  0.12 m                             Systemic Diam: 2.10 cm Rozann Lesches MD Electronically signed by Rozann Lesches MD Signature Date/Time: 06/02/2020/3:26:04 PM    Final     Orson Eva, DO  [facility]  If 7PM-7AM, please contact night-coverage [email or web address] Password [email or web address] 06/03/2020, 1:56 PM   LOS: 2 days

## 2020-06-03 NOTE — Consult Note (Signed)
Consultation Note Date: 06/03/2020   Patient Name: Joshua Robinson  DOB: December 01, 1934  MRN: [id number]  Age / Sex: 86 y.o., male  PCP: Redmond School, MD Referring Physician: Orson Eva, MD  Reason for Consultation: Establishing goals of care  HPI/Patient Profile: 86 y.o. male  with past medical history of arthritis, skin cancer, right total knee arthroplasty, previous tobacco/cigarette abuse admitted on 06/01/2020 with acute respiratory failure secondary to COVID pneumonia. Now requiring BiPAP 100% FiO2.   Clinical Assessment and Goals of Care: I met today with Mr. Guess's family after receiving report from [place] and Dr. Carles Collet. He is sleeping on BiPAP. I met with his wife outside his room. She is very anxious about his condition. She shares that they have been married almost 26 years. She worries that he is anxious because she is not there with him. She shares that he relies on her for much because of his arthritis effecting his function of hands. They have a daughter and son as well as a grandson and 49 month old great grandson. 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   I walked Ms. Scali down to her children who are outside waiting on her. They all understand that he is critically ill. They do confirm that they would want him intubated if indicated. Of note, daughter in law who is a Marine scientist, shares that she wants him to be comfortable. I did share with them that we are trying to keep him comfortable but there is a limit to providing medication so we are not sedated and depressing his respiratory drive while trying to avoid progression to ventilator. They express understanding.   At this time they are very upset and overwhelmed. Difficult to have focused goals of care conversation at this time. I will continue to follow and support. Emotional support provided.    Primary Decision Maker NEXT OF KIN wife    SUMMARY OF RECOMMENDATIONS   - Would desire intubation if needed - Desires aggressive care at this time  Code Status/Advance Care Planning:  Full code   Symptom Management:   Per attending  Palliative Prophylaxis:   Aspiration, Delirium Protocol, Frequent Pain Assessment and Turn Reposition  Additional Recommendations (Limitations, Scope, Preferences):  Full Scope Treatment  Psycho-social/Spiritual:   Desire for further Chaplaincy support:yes  Additional Recommendations: Caregiving  Support/Resources and Grief/Bereavement Support  Prognosis:   Overall prognosis poor.   Discharge Planning: To Be Determined      Primary Diagnoses: Present on Admission: . Acute respiratory disease due to COVID-19 virus . Acute respiratory failure with hypoxia Due to Covid PNA . Pneumonia due to COVID-19 virus   I have reviewed the medical record, interviewed the patient and family, and examined the patient. The following aspects are pertinent.  Past Medical History:  Diagnosis Date  . Arthritis   . Cancer ([place])    skin   Social History   Socioeconomic History  . Marital status: Married    Spouse name: Not on file  .  Number of children: Not on file  . Years of education: Not on file  . Highest education level: Not on file  Occupational History  . Not on file  Tobacco Use  . Smoking status: Former Smoker    Years: 40.00    Types: Cigarettes    Quit date: 05/17/1996    Years since quitting: 24.0  . Smokeless tobacco: Current User    Types: Snuff  Vaping Use  . Vaping Use: Never used  Substance and Sexual Activity  . Alcohol use: No  . Drug use: No  . Sexual activity: Not on file  Other Topics Concern  . Not on file  Social History Narrative  . Not on file   Social Determinants of Health   Financial Resource Strain: Not on file  Food Insecurity: Not on file  Transportation Needs: Not on file  Physical  Activity: Not on file  Stress: Not on file  Social Connections: Not on file   Family History  Problem Relation Age of Onset  . Diabetes Mother   . Hypertension Father    Scheduled Meds: . vitamin C  500 mg Oral Daily  . Chlorhexidine Gluconate Cloth  6 each Topical Daily  . enoxaparin (LOVENOX) injection  30 mg Subcutaneous Q12H  . folic acid  1 mg Oral Daily  . insulin aspart  0-9 Units Subcutaneous Q4H  . Ipratropium-Albuterol  1 puff Inhalation Q6H  . methylPREDNISolone (SOLU-MEDROL) injection  80 mg Intravenous Q12H  . multivitamin with minerals  1 tablet Oral Daily  . pantoprazole (PROTONIX) IV  40 mg Intravenous QHS  . sodium chloride flush  3 mL Intravenous Q12H  . sodium chloride flush  3 mL Intravenous Q12H  . thiamine  100 mg Oral Daily  . zinc sulfate  220 mg Oral Daily   Continuous Infusions: . sodium chloride    . azithromycin 500 mg (06/03/20 0955)  . cefTRIAXone (ROCEPHIN)  IV 2 g (06/03/20 0955)  . diltiazem (CARDIZEM) infusion 10 mg/hr (06/02/20 2000)  . sodium chloride     PRN Meds:.sodium chloride, acetaminophen **OR** acetaminophen, bisacodyl, chlorpheniramine-HYDROcodone, guaiFENesin-dextromethorphan, LORazepam, metoprolol tartrate, morphine injection, ondansetron **OR** ondansetron (ZOFRAN) IV, polyethylene glycol, polyvinyl alcohol, sodium chloride flush, traZODone Allergies  Allergen Reactions  . Bee Venom Other (See Comments)    Stung a few times and passed out   Review of Systems  Unable to perform ROS: Acuity of condition    Physical Exam Vitals and nursing note reviewed.  Constitutional:      General: He is sleeping. He is not in acute distress.    Appearance: He is ill-appearing.  Cardiovascular:     Rate and Rhythm: Rhythm irregularly irregular.  Pulmonary:     Comments: Shallow requiring significant support from BiPAP Abdominal:     General: Abdomen is flat.  Neurological:     Comments: Mostly unresponsive and when awake he has been  confused and agitated but he has not been awake at all today.      Vital Signs: BP (!) 142/70   Pulse 95   Temp (!) 97.3 F (36.3 C) (Axillary)   Resp (!) 30   Ht _0  (1.727 m)   Wt 76.9 kg   SpO2 96%   BMI 25.78 kg/m  Pain Scale: CPOT   Pain Score: 0-No pain   SpO2: SpO2: 96 % O2 Device:SpO2: 96 % O2 Flow Rate: .O2 Flow Rate (L/min): 40 L/min  IO: Intake/output summary:   Intake/Output Summary (Last 24 hours)  at 06/03/2020 1105 Last data filed at 06/03/2020 0330 Gross per 24 hour  Intake 705.39 ml  Output 875 ml  Net -169.61 ml    LBM: Last BM Date:  (PTA) Baseline Weight: Weight: 74.8 kg Most recent weight: Weight: 76.9 kg     Palliative Assessment/Data:     Time In: 1500 Time Out: 1530 Time Total: 30 min Greater than 50%  of this time was spent counseling and coordinating care related to the above assessment and plan.  Signed by: [name], NP Palliative Medicine Team Pager # [phone] (M-F 8a-5p) Team Phone # [phone] (Nights/Weekends)

## 2020-06-04 DIAGNOSIS — J069 Acute upper respiratory infection, unspecified: Secondary | ICD-10-CM | POA: Diagnosis not present

## 2020-06-04 DIAGNOSIS — U071 COVID-19: Secondary | ICD-10-CM | POA: Diagnosis not present

## 2020-06-04 DIAGNOSIS — I4891 Unspecified atrial fibrillation: Secondary | ICD-10-CM | POA: Diagnosis not present

## 2020-06-04 DIAGNOSIS — J9601 Acute respiratory failure with hypoxia: Secondary | ICD-10-CM | POA: Diagnosis not present

## 2020-06-04 DIAGNOSIS — Z7189 Other specified counseling: Secondary | ICD-10-CM | POA: Diagnosis not present

## 2020-06-04 DIAGNOSIS — Z515 Encounter for palliative care: Secondary | ICD-10-CM | POA: Diagnosis not present

## 2020-06-04 LAB — COMPREHENSIVE METABOLIC PANEL
ALT: 30 U/L (ref 0–44)
AST: 23 U/L (ref 15–41)
Albumin: 2.4 g/dL — ABNORMAL LOW (ref 3.5–5.0)
Alkaline Phosphatase: 110 U/L (ref 38–126)
Anion gap: 9 (ref 5–15)
BUN: 42 mg/dL — ABNORMAL HIGH (ref 8–23)
CO2: 25 mmol/L (ref 22–32)
Calcium: 8 mg/dL — ABNORMAL LOW (ref 8.9–10.3)
Chloride: 109 mmol/L (ref 98–111)
Creatinine, Ser: 0.85 mg/dL (ref 0.61–1.24)
GFR, Estimated: >60 mL/min (ref >60)
Glucose, Bld: 151 mg/dL — ABNORMAL HIGH (ref 70–99)
Potassium: 3.8 mmol/L (ref 3.5–5.1)
Sodium: 143 mmol/L (ref 135–145)
Total Bilirubin: 0.5 mg/dL (ref 0.3–1.2)
Total Protein: 5.6 g/dL — ABNORMAL LOW (ref 6.5–8.1)

## 2020-06-04 LAB — CBC WITH DIFFERENTIAL/PLATELET
Abs Immature Granulocytes: 0.1 10*3/uL — ABNORMAL HIGH (ref 0.00–0.07)
Basophils Absolute: 0 10*3/uL (ref 0.0–0.1)
Basophils Relative: 0 %
Eosinophils Absolute: 0 10*3/uL (ref 0.0–0.5)
Eosinophils Relative: 0 %
HCT: 36.8 % — ABNORMAL LOW (ref 39.0–52.0)
Hemoglobin: 12.6 g/dL — ABNORMAL LOW (ref 13.0–17.0)
Immature Granulocytes: 1 %
Lymphocytes Relative: 5 %
Lymphs Abs: 0.6 10*3/uL — ABNORMAL LOW (ref 0.7–4.0)
MCH: 33.8 pg (ref 26.0–34.0)
MCHC: 34.2 g/dL (ref 30.0–36.0)
MCV: 98.7 fL (ref 80.0–100.0)
Monocytes Absolute: 0.4 10*3/uL (ref 0.1–1.0)
Monocytes Relative: 3 %
Neutro Abs: 10.1 10*3/uL — ABNORMAL HIGH (ref 1.7–7.7)
Neutrophils Relative %: 91 %
Platelets: 179 10*3/uL (ref 150–400)
RBC: 3.73 MIL/uL — ABNORMAL LOW (ref 4.22–5.81)
RDW: 13.2 % (ref 11.5–15.5)
WBC: 11.1 10*3/uL — ABNORMAL HIGH (ref 4.0–10.5)
nRBC: 0 % (ref 0.0–0.2)

## 2020-06-04 LAB — GLUCOSE, CAPILLARY
Glucose-Capillary: 138 mg/dL — ABNORMAL HIGH (ref 70–99)
Glucose-Capillary: 114 mg/dL — ABNORMAL HIGH (ref 70–99)
Glucose-Capillary: 123 mg/dL — ABNORMAL HIGH (ref 70–99)
Glucose-Capillary: 145 mg/dL — ABNORMAL HIGH (ref 70–99)
Glucose-Capillary: 143 mg/dL — ABNORMAL HIGH (ref 70–99)

## 2020-06-04 LAB — D-DIMER, QUANTITATIVE: D-Dimer, Quant: >20 ug/mL-FEU — ABNORMAL HIGH (ref 0.00–0.50)

## 2020-06-04 LAB — C-REACTIVE PROTEIN: CRP: 10.3 mg/dL — ABNORMAL HIGH (ref <1.0)

## 2020-06-04 LAB — MAGNESIUM: Magnesium: 2.7 mg/dL — ABNORMAL HIGH (ref 1.7–2.4)

## 2020-06-04 LAB — HEMOGLOBIN A1C
Hgb A1c MFr Bld: 5.2 % (ref 4.8–5.6)
Mean Plasma Glucose: 102.54 mg/dL

## 2020-06-04 LAB — PHOSPHORUS: Phosphorus: 3 mg/dL (ref 2.5–4.6)

## 2020-06-04 LAB — FERRITIN: Ferritin: 408 ng/mL — ABNORMAL HIGH (ref 24–336)

## 2020-06-04 NOTE — Progress Notes (Signed)
Palliative:  HPI: 86 y.o. male  with past medical history of arthritis, skin cancer, right total knee arthroplasty, previous tobacco/cigarette abuse admitted on 06/01/2020 with acute respiratory failure secondary to COVID pneumonia. Now requiring BiPAP 100% FiO2. Down to 50% 06/04/20.   I attempted to call daughter, [name], x 2 today as she had left me a voicemail but unable to reach her. I did call and speak with wife, [name]. I explained to [name] that Mr. Mitro is hanging in there. He is very sleepy today and not really interactive or very responsive but he had received quite a bit of sedating medication that likely just needs to make it out of his system. He continues to be very ill and needing a lot of support but not as much support from BiPAP as he was yesterday (now on 50% vs 80-100% FiO2 yesterday). Discussed that there are no signs of bacterial infection so 2 antibiotics stopped but still treating the COVID virus the best we can. We did discuss that this is an illness that often takes a long time to recover and sometimes there is permanent lung damage from the virus that can be a barrier to improvement but too soon to know at this stage. [name] expresses understanding and that she is placing this in God's hands. She is very religious and finds strength through prayer and trusting God.   All questions/concerns addressed. Emotional support provided.   Exam: Minimally responsive. Does not follow commands. In AF but HR stable 80-90s. BiPAP 50% FiO2. Abd flat.   Plan: - Family desire ongoing aggressive care. Hopeful for eventual improvement.   [place], NP Palliative Medicine Team Pager [phone] (Please see [email or web address] for schedule) Team Phone [phone]    Greater than 50%  of this time was spent counseling and coordinating care related to the above assessment and plan

## 2020-06-04 NOTE — Progress Notes (Signed)
Attempted to take patient off BIPAP and place on 100% NRB.  O2 sats dropped into the 80's and respiratory rate increased from the low 20's to the 40's and his breathing became labored. Patient has some redness and breakdown on the bridge of his nose.  RN at bedside and aware.  A medplex foam placed across nose as a barrier.  Patient placed back on BIPAP.  O2 sats increased back to the 90's and breathing became much less labored.  RT will continue to monitor.

## 2020-06-04 NOTE — Progress Notes (Signed)
PROGRESS NOTE  GEN CLAGG PYP:[id number] DOB: 03/02/1935 DOA: 06/01/2020 PCP: Redmond School, MD  Brief History:  86 year old male with no documented chronic medical problems, but is a reformed smoker of 50 pack years presenting with myalgias, coughing, and shortness of breath that began on 05/24/2020.  The patient states that he visited his PCP on a virtual visit on 05/27/2019.  He was given oral steroids and azithromycin.  His symptoms did not improve.  He continued to have worsening shortness of breath, coughing.  He did have some intermittent chest discomfort with coughing but denied any hemoptysis.  He had some loose stools but did not have any hematochezia or melena.  He also had subjective fevers and chills at home.  He has had 2 doses of the Moderna vaccination but has not been boosted.  EMS was activated and the patient was noted to have oxygen saturation 67% on room air.  He was placed on CPAP and brought to the hospital.  In the emergency department, the patient was subsequently placed on high flow nasal cannula.  He did not tolerate it well and was placed on BiPAP.  He has been subsequently transitioned to heated high flow at 40 L.  The patient was given Actemra and started on intravenous steroids. In addition, the patient was noted to have atrial fibrillation with RVR.  He was started on a diltiazem drip.  Assessment/Plan: Acute respiratory failure with hypoxia secondary to COVID-19 - still requiring BiPAP at 50% FiO2 with saturations 90-92% -Continue intravenous Solu-Medrol and as needed bronchodilator. -received Actemra on 06/01/20 -D-dimer 2.95>> 2.21>>16.67 -CRP 36.4>>18.9 -Ferritin 528>>509 -PCT 0.36>>0.19 -Personally reviewed chest x-ray--diffuse bilateral opacities, requiring left -06/02/2020 CTA chest--negative PE, diffuse groundglass opacities and consolidative opacities -Continue Vitamin C and zinc -Prone positioning as much as possible. -WBCs within normal  limits; procalcitonin level not elevated.  Antibiotics will be discontinued. -Follow blood cultures--neg to date -morphine and ativan judiciously to help with agitation if needed.  Atrial fibrillation with RVR -Continue diltiazem drip for now -Transition to oral diltiazem once respiratory status has stabilized and patient can reliably take p.o. -Echo--EF 50-55%, no WMA, RVSP 50 -TSH--0.124 -CHADS-VASc= 3 (age, ASVD) -Continue Lovenox; when able to tolerate p.o. might be a good candidate for Eliquis. -Patient reviewed EKG--atrial fibrillation, nonspecific ST-T wave change  Hyponatremia -Secondary to volume depletion and poor solute intake -Continue judicious IV fluids and follow electrolytes trend.  Elevated troponin -Secondary to demand ischemia due to acute medical illness as well as atrial fibrillation. -No chest pain presently -Echocardiogram--EF 50-55%, no WMA, moderately elevated PASP (50), mod TR  Hyperglycemia -Likely steroid-induced -A1c 5.2 -NovoLog sliding scale  Presumptive COPD -Patient has had 50-pack-year history of tobacco -Quit tobacco 20 years ago -Continue Combivent -Continue steroids as mentioned above.  [place] -Advance care planning, including the explanation and discussion of advance directives was carried out with the patient and family.  Code status including explanations of "Full Code" and "DNR" and alternatives were discussed in detail.  Discussion of end-of-life issues including but not limited palliative care, hospice care and the concept of hospice, other end-of-life care options, power of attorney for health care decisions, living wills, and physician orders for life-sustaining treatment were also discussed with the patient and family.  Total face to face time 16 minutes. -spouse wants full scope of care -palliative medicine consult; will continue to follow [place] discussion outcome.    Status is: Inpatient  Remains inpatient appropriate  because:IV treatments appropriate due to intensity of illness or inability to take PO   Dispo: The patient is from: Home  Anticipated d/c is to: Home  Anticipated d/c date is: 3 days  Patient currently is not medically stable to d/c.    Family Communication:  spouse/daughter updated by Palliative care service 06/04/20  Consultants:   Palliative care service  Code Status:  FULL-confirmed with patient  DVT Prophylaxis:  lovenox treatment dose   Procedures: As Listed in Progress Note Above  Antibiotics: Ceftriaxone 1/16>> azithro 1/16>> vanco 1/16   Subjective: Afebrile, no chest pain, no nausea, no vomiting, no diarrhea reported.  Still requiring BiPAP (FiO2 50%).  Unable to follow commands and essentially unresponsive.  Objective: Vitals:   06/04/20 1600 06/04/20 1615 06/04/20 1630 06/04/20 1649  BP: 129/60 136/71 134/65   Pulse: 85 95 89 95  Resp: 20 (!) 25 (!) 26 (!) 25  Temp: 98.24 F (36.8 C) 98.24 F (36.8 C) 98.24 F (36.8 C) 98.24 F (36.8 C)  TempSrc:    Bladder  SpO2: 98% 98% 96% 98%  Weight:      Height:        Intake/Output Summary (Last 24 hours) at 06/04/2020 1839 Last data filed at 06/04/2020 1751 Gross per 24 hour  Intake --  Output 352 ml  Net -352 ml   Weight change: -0.2 kg  Exam: General exam: Afebrile, no chest pain, no nausea or vomiting reported.  Still requiring BiPAP with an FiO2 of 50%.  Unable to follow commands, sedated. Respiratory system: Positive scattered rhonchi; no wheezing, no crackles, while on BiPAP no using accessory muscles. Cardiovascular system: Rate controlled; atrial fibrillation appreciated on telemetry.  No JVD, no rubs or gallops. Gastrointestinal system: Abdomen is nondistended, soft and nontender. No organomegaly or masses felt. Normal bowel sounds heard. Central nervous system: No focal neurological deficits; able to move intermittently 4 limbs  spontaneously. Extremities: No cyanosis or clubbing. Skin: No petechiae. Psychiatry: Unable to properly assess with current ongoing unresponsive status.   Data Reviewed: I have personally reviewed following labs and imaging studies  Basic Metabolic Panel: Recent Labs  Lab 06/01/20 1215 06/02/20 0430 06/03/20 0521 06/04/20 0528  NA 132* 136 140 143  K 4.0 3.8 3.9 3.8  CL 98 102 105 109  CO2 23 23 24 25   GLUCOSE 127* 137* 148* 151*  BUN 22 23 35* 42*  CREATININE 0.98 0.82 0.80 0.85  CALCIUM 8.6* 8.3* 8.2* 8.0*  MG  --  2.3 2.7* 2.7*  PHOS  --  3.1 4.1 3.0   Liver Function Tests: Recent Labs  Lab 06/01/20 1215 06/02/20 0430 06/03/20 0521 06/04/20 0528  AST 38 29 25 23   ALT 41 36 37 30  ALKPHOS 124 106 112 110  BILITOT 0.8 0.6 0.6 0.5  PROT 7.7 6.6 6.3* 5.6*  ALBUMIN 3.1* 2.6* 2.6* 2.4*   Coagulation Profile: Recent Labs  Lab 06/01/20 1215  INR 1.2   CBC: Recent Labs  Lab 06/01/20 1215 06/02/20 0430 06/03/20 0521 06/04/20 0528  WBC 22.3* 15.3* 17.2* 11.1*  NEUTROABS 19.9* 13.9* 15.7* 10.1*  HGB 14.2 13.1 13.1 12.6*  HCT 40.8 38.3* 39.8 36.8*  MCV 97.1 97.0 100.5* 98.7  PLT 368 285 296 179   CBG: Recent Labs  Lab 06/03/20 2314 06/04/20 0327 06/04/20 0820 06/04/20 1056 06/04/20 1727  GLUCAP 123* 138* 114* 123* 145*   HbA1C: Recent Labs    06/03/20 0521 06/04/20 0528  HGBA1C 5.1 5.2   Urine analysis:  Component Value Date/Time   COLORURINE YELLOW 06/01/2020 1215   APPEARANCEUR HAZY (A) 06/01/2020 1215   LABSPEC 1.026 06/01/2020 1215   PHURINE 5.0 06/01/2020 1215   GLUCOSEU NEGATIVE 06/01/2020 1215   HGBUR NEGATIVE 06/01/2020 1215   BILIRUBINUR NEGATIVE 06/01/2020 1215   KETONESUR 5 (A) 06/01/2020 1215   PROTEINUR 100 (A) 06/01/2020 1215   UROBILINOGEN 0.2 02/12/2013 1150   NITRITE NEGATIVE 06/01/2020 1215   LEUKOCYTESUR NEGATIVE 06/01/2020 1215   Sepsis Labs:  Recent Results (from the past 240 hour(s))  Blood culture (routine  single)     Status: None (Preliminary result)   Collection Time: 06/01/20 12:15 PM   Specimen: Right Antecubital; Blood  Result Value Ref Range Status   Specimen Description   Final    RIGHT ANTECUBITAL BOTTLES DRAWN AEROBIC AND ANAEROBIC   Special Requests Blood Culture adequate volume  Final   Culture   Final    NO GROWTH 3 DAYS Performed at [facility], [address] [place], [place] [address]    Report Status PENDING  Incomplete  Urine culture     Status: None   Collection Time: 06/01/20 12:15 PM   Specimen: In/Out Cath Urine  Result Value Ref Range Status   Specimen Description   Final    IN/OUT CATH URINE Performed at [facility], [address] [place], [place] [address]    Special Requests   Final    NONE Performed at [facility], [address], [place], [place] [address]    Culture   Final    NO GROWTH Performed at [name], [place] [address], [place], [place] [address]    Report Status 06/03/2020 FINAL  Final  Blood Culture (routine x 2)     Status: None (Preliminary result)   Collection Time: 06/01/20 12:30 PM   Specimen: BLOOD RIGHT FOREARM  Result Value Ref Range Status   Specimen Description   Final    BLOOD RIGHT FOREARM BOTTLES DRAWN AEROBIC AND ANAEROBIC   Special Requests Blood Culture adequate volume  Final   Culture   Final    NO GROWTH 3 DAYS Performed at [facility], [address], [place], [place] [address]    Report Status PENDING  Incomplete  MRSA PCR Screening     Status: None   Collection Time: 06/01/20 12:40 PM   Specimen: Nasopharyngeal  Result Value Ref Range Status   MRSA by PCR NEGATIVE NEGATIVE Final    Comment:        The GeneXpert MRSA Assay (FDA approved for NASAL specimens only), is one component of a comprehensive MRSA colonization surveillance program. It is not intended to diagnose MRSA infection nor to guide or monitor treatment for MRSA infections. Performed at [facility], [address], [place],  [place] [address]   MRSA PCR Screening     Status: None   Collection Time: 06/02/20  2:01 PM   Specimen: Nasopharyngeal  Result Value Ref Range Status   MRSA by PCR NEGATIVE NEGATIVE Final    Comment:        The GeneXpert MRSA Assay (FDA approved for NASAL specimens only), is one component of a comprehensive MRSA colonization surveillance program. It is not intended to diagnose MRSA infection nor to guide or monitor treatment for MRSA infections. Performed at [facility], [address] [place], [place] [address]      Scheduled Meds: . vitamin C  500 mg Oral Daily  . Chlorhexidine Gluconate Cloth  6 each Topical Daily  .  enoxaparin (LOVENOX) injection  80 mg Subcutaneous Q12H  . folic acid  1 mg Intravenous Daily  . insulin aspart  0-9 Units Subcutaneous Q4H  . Ipratropium-Albuterol  1 puff Inhalation Q6H  . methylPREDNISolone (SOLU-MEDROL) injection  80 mg Intravenous Q12H  . pantoprazole (PROTONIX) IV  40 mg Intravenous QHS  . sodium chloride flush  3 mL Intravenous Q12H  . sodium chloride flush  3 mL Intravenous Q12H  . thiamine injection  100 mg Intravenous Daily  . zinc sulfate  220 mg Oral Daily   Continuous Infusions: . sodium chloride    . diltiazem (CARDIZEM) infusion 5 mg/hr (06/04/20 0000)  . sodium chloride      Procedures/Studies: CT Angio Chest PE W and/or Wo Contrast  Result Date: 06/01/2020 CLINICAL DATA:  cough, COVID+, elevated d dimer EXAM: CT ANGIOGRAPHY CHEST WITH CONTRAST TECHNIQUE: Multidetector CT imaging of the chest was performed using the standard protocol during bolus administration of intravenous contrast. Multiplanar CT image reconstructions and MIPs were obtained to evaluate the vascular anatomy. CONTRAST:  11mL OMNIPAQUE IOHEXOL 350 MG/ML SOLN COMPARISON:  Xr chest 06/01/20, xr chest 02/12/13 FINDINGS: Cardiovascular: Satisfactory opacification of the pulmonary arteries to the segmental level. No evidence of pulmonary embolism. Enlarged right atria.  Otherwise heart size. No significant pericardial effusion. The thoracic aorta is normal in caliber. Mild atherosclerotic plaque of the thoracic aorta. Mild 2 vessel coronary artery calcifications. Mediastinum/Nodes: No enlarged mediastinal, hilar, or axillary lymph nodes. Thyroid gland, trachea, and esophagus demonstrate no significant findings. Lungs/Pleura: Diffuse ground glass and consolidative opacities involving the majority of the lungs. Limited evaluation for pulmonary nodule due to minimal aerated lung. No definite pulmonary mass. No pleural effusion. No pneumothorax. Upper Abdomen: No acute abnormality. Musculoskeletal: No chest wall abnormality No suspicious lytic or blastic osseous lesions. No acute displaced fracture. Multilevel degenerative changes of the spine. Review of the MIP images confirms the above findings. IMPRESSION: 1. No pulmonary embolus. 2. Diffuse multifocal pneumonia in the setting of COVID-19 infection. 3.  Aortic Atherosclerosis (ICD10-I70.0). Electronically Signed   By: Iven Finn M.D.   On: 06/01/2020 15:26   DG Chest Port 1 View  Result Date: 06/01/2020 CLINICAL DATA:  PT on Bipap unable to give hx. Positive Covid test. Hx of CA. ER notes:Pt brought in by [facility] from home with c/o SOB and productive green sputum x 7 days. EMS arrived and pt was found to be 67% on RA. Pt was placed on CPAP and sats increased to 85%. Pt was also given 4 tablets of Nitro by EMS. EMS reports crackles in all lung fields. HR 150, resp 50 per EMS EXAM: PORTABLE CHEST 1 VIEW COMPARISON:  02/12/2013 FINDINGS: Bilateral hazy airspace lung opacities are noted consistent multifocal pneumonia. No evidence of pulmonary edema. No pleural effusion or pneumothorax. Cardiac silhouette is normal in size. No mediastinal or hilar masses. Skeletal structures are grossly intact. IMPRESSION: Bilateral hazy airspace lung opacities consistent with multifocal pneumonia, pattern consistent with atypical/viral infection  including COVID 19. Electronically Signed   By: Lajean Manes M.D.   On: 06/01/2020 12:37   ECHOCARDIOGRAM COMPLETE  Result Date: 06/02/2020    ECHOCARDIOGRAM REPORT   Patient Name:   Joshua Robinson Date of Exam: 06/02/2020 Medical Rec #:  [id number]         Height:       68.0 in Accession #:    [id number]        Weight:       165.0 lb Date  of Birth:  February 07, 1935         BSA:          1.883 m Patient Age:    53 years          BP:           107/75 mmHg Patient Gender: M                 HR:           101 bpm. Exam Location:  [facility] Procedure: 2D Echo, Cardiac Doppler and Color Doppler Indications:    Dyspnea R06.00  History:        Patient has no prior history of Echocardiogram examinations.                 Arrythmias:Atrial Fibrillation. Pneumonia due to COVID-19 virus.  Sonographer:    Alvino Chapel RCS Referring Phys: [phone] COURAGE EMOKPAE IMPRESSIONS  1. Left ventricular ejection fraction, by estimation, is 50 to 55%. The left ventricle has low normal function. The left ventricle has no regional wall motion abnormalities. Left ventricular diastolic parameters are indeterminate.  2. Right ventricular systolic function is normal. The right ventricular size is normal. There is moderately elevated pulmonary artery systolic pressure. The estimated right ventricular systolic pressure is Q000111Q mmHg.  3. Left atrial size was mildly dilated.  4. Right atrial size was moderately dilated.  5. There is a trivial pericardial effusion posterior to the left ventricle.  6. The mitral valve is grossly normal. Mild mitral valve regurgitation.  7. Tricuspid valve regurgitation is moderate.  8. The aortic valve is tricuspid. There is mild calcification of the aortic valve. Aortic valve regurgitation is not visualized.  9. The inferior vena cava is dilated in size with <50% respiratory variability, suggesting right atrial pressure of 15 mmHg. FINDINGS  Left Ventricle: Left ventricular ejection fraction, by estimation, is 50 to  55%. The left ventricle has low normal function. The left ventricle has no regional wall motion abnormalities. The left ventricular internal cavity size was normal in size. There is no left ventricular hypertrophy. Left ventricular diastolic parameters are indeterminate. Right Ventricle: The right ventricular size is normal. No increase in right ventricular wall thickness. Right ventricular systolic function is normal. There is moderately elevated pulmonary artery systolic pressure. The tricuspid regurgitant velocity is 2.96 m/s, and with an assumed right atrial pressure of 15 mmHg, the estimated right ventricular systolic pressure is Q000111Q mmHg. Left Atrium: Left atrial size was mildly dilated. Right Atrium: Right atrial size was moderately dilated. Pericardium: Trivial pericardial effusion is present. The pericardial effusion is posterior to the left ventricle. Mitral Valve: The mitral valve is grossly normal. Mild to moderate mitral annular calcification. Mild mitral valve regurgitation. Tricuspid Valve: The tricuspid valve is grossly normal. Tricuspid valve regurgitation is moderate. Aortic Valve: The aortic valve is tricuspid. There is mild calcification of the aortic valve. There is mild to moderate aortic valve annular calcification. Aortic valve regurgitation is not visualized. Pulmonic Valve: The pulmonic valve was grossly normal. Pulmonic valve regurgitation is trivial. Aorta: The aortic root is normal in size and structure. Venous: The inferior vena cava is dilated in size with less than 50% respiratory variability, suggesting right atrial pressure of 15 mmHg. IAS/Shunts: No atrial level shunt detected by color flow Doppler.  LEFT VENTRICLE PLAX 2D LVIDd:         4.50 cm LVIDs:         3.00 cm LV PW:  0.90 cm LV IVS:        1.00 cm LVOT diam:     2.10 cm LV SV:         42 LV SV Index:   22 LVOT Area:     3.46 cm  RIGHT VENTRICLE TAPSE (M-mode): 1.4 cm LEFT ATRIUM             Index       RIGHT  ATRIUM           Index LA diam:        3.80 cm 2.02 cm/m  RA Area:     25.60 cm LA Vol (A2C):   59.9 ml 31.81 ml/m RA Volume:   86.30 ml  45.82 ml/m LA Vol (A4C):   66.5 ml 35.31 ml/m LA Biplane Vol: 67.7 ml 35.95 ml/m  AORTIC VALVE LVOT Vmax:   64.30 cm/s LVOT Vmean:  39.700 cm/s LVOT VTI:    0.120 m  AORTA Ao Root diam: 3.60 cm MITRAL VALVE                TRICUSPID VALVE MV Area (PHT): 4.17 cm     TR Peak grad:   35.0 mmHg MV Decel Time: 182 msec     TR Vmax:        296.00 cm/s MV E velocity: 139.00 cm/s                             SHUNTS                             Systemic VTI:  0.12 m                             Systemic Diam: 2.10 cm Rozann Lesches MD Electronically signed by Rozann Lesches MD Signature Date/Time: 06/02/2020/3:26:04 PM    Final     CRITICAL CARE Performed by: Barton Dubois MD [facility] hospitalist    Total critical care time:35 minutes  Critical care time was exclusive of separately billable procedures and treating other patients.  Critical care was necessary to treat or prevent imminent or life-threatening deterioration.  Critical care was time spent personally by me on the following activities: development of treatment plan with patient and/or surrogate as well as nursing, discussions with consultants, evaluation of patient's response to treatment, examination of patient, obtaining history from patient or surrogate, ordering and performing treatments and interventions, ordering and review of laboratory studies, ordering and review of radiographic studies, pulse oximetry and re-evaluation of patient's condition.    06/04/2020, 6:39 PM   LOS: 3 days

## 2020-06-05 DIAGNOSIS — J069 Acute upper respiratory infection, unspecified: Secondary | ICD-10-CM | POA: Diagnosis not present

## 2020-06-05 DIAGNOSIS — I4891 Unspecified atrial fibrillation: Secondary | ICD-10-CM | POA: Diagnosis not present

## 2020-06-05 DIAGNOSIS — Z515 Encounter for palliative care: Secondary | ICD-10-CM | POA: Diagnosis not present

## 2020-06-05 DIAGNOSIS — J9601 Acute respiratory failure with hypoxia: Secondary | ICD-10-CM | POA: Diagnosis not present

## 2020-06-05 DIAGNOSIS — U071 COVID-19: Secondary | ICD-10-CM | POA: Diagnosis not present

## 2020-06-05 LAB — CBC WITH DIFFERENTIAL/PLATELET
Abs Immature Granulocytes: 0.2 10*3/uL — ABNORMAL HIGH (ref 0.00–0.07)
Basophils Absolute: 0 10*3/uL (ref 0.0–0.1)
Basophils Relative: 0 %
Eosinophils Absolute: 0 10*3/uL (ref 0.0–0.5)
Eosinophils Relative: 0 %
HCT: 38.5 % — ABNORMAL LOW (ref 39.0–52.0)
Hemoglobin: 13.2 g/dL (ref 13.0–17.0)
Immature Granulocytes: 2 %
Lymphocytes Relative: 4 %
Lymphs Abs: 0.5 10*3/uL — ABNORMAL LOW (ref 0.7–4.0)
MCH: 33.8 pg (ref 26.0–34.0)
MCHC: 34.3 g/dL (ref 30.0–36.0)
MCV: 98.7 fL (ref 80.0–100.0)
Monocytes Absolute: 0.4 10*3/uL (ref 0.1–1.0)
Monocytes Relative: 3 %
Neutro Abs: 11.4 10*3/uL — ABNORMAL HIGH (ref 1.7–7.7)
Neutrophils Relative %: 91 %
Platelets: 190 10*3/uL (ref 150–400)
RBC: 3.9 MIL/uL — ABNORMAL LOW (ref 4.22–5.81)
RDW: 13.2 % (ref 11.5–15.5)
WBC: 12.5 10*3/uL — ABNORMAL HIGH (ref 4.0–10.5)
nRBC: 0 % (ref 0.0–0.2)

## 2020-06-05 LAB — GLUCOSE, CAPILLARY
Glucose-Capillary: 117 mg/dL — ABNORMAL HIGH (ref 70–99)
Glucose-Capillary: 120 mg/dL — ABNORMAL HIGH (ref 70–99)
Glucose-Capillary: 129 mg/dL — ABNORMAL HIGH (ref 70–99)
Glucose-Capillary: 133 mg/dL — ABNORMAL HIGH (ref 70–99)
Glucose-Capillary: 133 mg/dL — ABNORMAL HIGH (ref 70–99)
Glucose-Capillary: 134 mg/dL — ABNORMAL HIGH (ref 70–99)

## 2020-06-05 LAB — COMPREHENSIVE METABOLIC PANEL
ALT: 32 U/L (ref 0–44)
AST: 26 U/L (ref 15–41)
Albumin: 2.8 g/dL — ABNORMAL LOW (ref 3.5–5.0)
Alkaline Phosphatase: 108 U/L (ref 38–126)
Anion gap: 11 (ref 5–15)
BUN: 45 mg/dL — ABNORMAL HIGH (ref 8–23)
CO2: 25 mmol/L (ref 22–32)
Calcium: 8.3 mg/dL — ABNORMAL LOW (ref 8.9–10.3)
Chloride: 110 mmol/L (ref 98–111)
Creatinine, Ser: 0.84 mg/dL (ref 0.61–1.24)
GFR, Estimated: >60 mL/min (ref >60)
Glucose, Bld: 145 mg/dL — ABNORMAL HIGH (ref 70–99)
Potassium: 3.6 mmol/L (ref 3.5–5.1)
Sodium: 146 mmol/L — ABNORMAL HIGH (ref 135–145)
Total Bilirubin: 1.1 mg/dL (ref 0.3–1.2)
Total Protein: 6.2 g/dL — ABNORMAL LOW (ref 6.5–8.1)

## 2020-06-05 LAB — C-REACTIVE PROTEIN: CRP: 6.6 mg/dL — ABNORMAL HIGH (ref <1.0)

## 2020-06-05 LAB — D-DIMER, QUANTITATIVE: D-Dimer, Quant: >20 ug/mL-FEU — ABNORMAL HIGH (ref 0.00–0.50)

## 2020-06-05 LAB — FERRITIN: Ferritin: 549 ng/mL — ABNORMAL HIGH (ref 24–336)

## 2020-06-05 LAB — MAGNESIUM: Magnesium: 2.6 mg/dL — ABNORMAL HIGH (ref 1.7–2.4)

## 2020-06-05 LAB — PHOSPHORUS: Phosphorus: 3.6 mg/dL (ref 2.5–4.6)

## 2020-06-05 MED ORDER — IPRATROPIUM-ALBUTEROL 20-100 MCG/ACT IN AERS: 1.0000 | INHALATION_SPRAY | Freq: Four times a day (QID) | RESPIRATORY_TRACT | Status: DC

## 2020-06-05 NOTE — Progress Notes (Signed)
Palliative:  HPI: 86 y.o.malewith past medical history of arthritis, skin cancer, right total knee arthroplasty, previous tobacco/cigarette abuseadmitted on 1/16/2022with acute respiratory failure secondary to COVID pneumonia.Now requiringBiPAP 100% FiO2.Down to 50% 06/04/20. Plans for trial off BiPAP 06/05/20 as he is more awake and responsive today.   I met today at Joshua Robinson's bedside. He is more awake and alert. He is following simple commands. Still has BiPAP and seems to make attempts to verbalize but unable to understand. RN reports that she could not understand him even when given a break off BiPAP. He has no distress. He is still anxious. I reassured him that his wife and family are thinking of him and that they want to be here with him but they are not allowed per hospital policy because of his COVID infection. I reassured him that we are keeping them updated. He shakes head no for pain or difficulty breathing.   Exam: Trying to wake up more today, more responsive. Still confused. Difficult to fully assess as he is still requiring BiPAP 40% FiO2. No distress. Breathing regular, unlabored. Abd flat.   Plan: - Continue full aggressive care with hopes of improvement.   15 min  [name], NP Palliative Medicine Team Pager [phone] (Please see [email or web address] for schedule) Team Phone [phone]    Greater than 50%  of this time was spent counseling and coordinating care related to the above assessment and plan

## 2020-06-05 NOTE — Progress Notes (Signed)
PROGRESS NOTE  Joshua Robinson [id number] DOB: 01-Nov-1934 DOA: 06/01/2020 PCP: Redmond School, MD  Brief History:  86 year old male with no documented chronic medical problems, but is a reformed smoker of 50 pack years presenting with myalgias, coughing, and shortness of breath that began on 05/24/2020.  The patient states that he visited his PCP on a virtual visit on 05/27/2019.  He was given oral steroids and azithromycin.  His symptoms did not improve.  He continued to have worsening shortness of breath, coughing.  He did have some intermittent chest discomfort with coughing but denied any hemoptysis.  He had some loose stools but did not have any hematochezia or melena.  He also had subjective fevers and chills at home.  He has had 2 doses of the Moderna vaccination but has not been boosted.  EMS was activated and the patient was noted to have oxygen saturation 67% on room air.  He was placed on CPAP and brought to the hospital.  In the emergency department, the patient was subsequently placed on high flow nasal cannula.  He did not tolerate it well and was placed on BiPAP.  He has been subsequently transitioned to heated high flow at 40 L.  The patient was given Actemra and started on intravenous steroids. In addition, the patient was noted to have atrial fibrillation with RVR.  He was started on a diltiazem drip.  Assessment/Plan: Acute respiratory failure with hypoxia secondary to COVID-19 - still requiring BiPAP at 50% FiO2 with saturations 90-92% -Continue intravenous Solu-Medrol and as needed bronchodilator. -received Actemra on 06/01/20 -Continue to follow inflammatory markers. -Personally reviewed chest x-ray--diffuse bilateral opacities, requiring left -06/02/2020 CTA chest--negative PE, diffuse groundglass opacities and consolidative opacities -Continue Vitamin C and zinc -Will work as an outpatient able to cooperate with proning position, as well as using incentive  spirometer and flutter valve. -Continue holding antibiotics at this time; procalcitonin level not elevated. -Patient is afebrile. -Follow blood cultures--neg to date -We will try to avoid as much as possible the use of sedative agents -Despite the risk of potential aspiration while on BiPAP in case he ended vomiting, pursued the use of soft restraints.  Atrial fibrillation with RVR -Continue diltiazem drip for now -Transition to oral diltiazem once respiratory status has stabilized and patient can reliably take p.o. -Echo--EF 50-55%, no WMA, RVSP 50 -TSH--0.124 -CHADS-VASc= 3 (age, ASVD) -Continue Lovenox; when able to tolerate p.o. might be a good candidate for Eliquis. -Patient reviewed EKG--atrial fibrillation, nonspecific ST-T wave change  Hyponatremia -Secondary to volume depletion and poor solute intake -Continue judicious IV fluids and follow electrolytes trend.  Elevated troponin -Secondary to demand ischemia due to acute medical illness as well as atrial fibrillation. -No chest pain presently -Echocardiogram--EF 50-55%, no WMA, moderately elevated PASP (50), mod TR  Hyperglycemia -Likely steroid-induced -A1c 5.2 -NovoLog sliding scale  Presumptive COPD -Patient has had 50-pack-year history of tobacco -Quit tobacco 20 years ago -Continue Combivent -Continue steroids as mentioned above.  [place] -Advance care planning, including the explanation and discussion of advance directives was carried out with the patient and family.  Code status including explanations of "Full Code" and "DNR" and alternatives were discussed in detail.  Discussion of end-of-life issues including but not limited palliative care, hospice care and the concept of hospice, other end-of-life care options, power of attorney for health care decisions, living wills, and physician orders for life-sustaining treatment were also discussed with the patient and family.  Total face to face time 16  minutes. -spouse wants full scope of care -palliative medicine consult; will continue to follow [place] discussion outcome.    Status is: Inpatient  Remains inpatient appropriate because:IV treatments appropriate due to intensity of illness or inability to take PO   Dispo: The patient is from: Home  Anticipated d/c is to: Home  Anticipated d/c date is: 3 days  Patient currently is not medically stable to d/c.    Family Communication:   Patient's plan of care discussed over the phone with wife 06/05/2020.  Consultants:   Palliative care service  Code Status:  FULL-confirmed with patient  DVT Prophylaxis:  lovenox treatment dose   Procedures: As Listed in Progress Note Above  Antibiotics: Ceftriaxone 1/16>> azithro 1/16>> vanco 1/16   Subjective: BiPAP with an FiO2 of 50% still in place; no fever, no chest pain, no nausea or vomiting.  Rate control while receiving IV Cardizem.  Telemetry demonstrating atrial fibrillation.   Objective: Vitals:   06/05/20 1000 06/05/20 1139 06/05/20 1238 06/05/20 1633  BP: (!) 141/84     Pulse: (!) 110 98  90  Resp: (!) 26 (!) 21  (!) 21  Temp: 97.7 F (36.5 C) 98.24 F (36.8 C)  98.6 F (37 C)  TempSrc:  Bladder  Bladder  SpO2: 96% 96% 94% 98%  Weight:      Height:        Intake/Output Summary (Last 24 hours) at 06/05/2020 1805 Last data filed at 06/05/2020 2423 Gross per 24 hour  Intake 478.51 ml  Output 1100 ml  Net -621.49 ml   Weight change:   Exam: General exam: Patient able to answer simple questions today; opening his eyes to commands.  No nausea, no vomiting, no fever.  Still requiring BiPAP; FiO2 50%. Respiratory system: No wheezing, no crackles, no using accessory muscles.  Positive rhonchi bilaterally appreciated. Cardiovascular system:Rate controlled; atrial fibrillation appreciated on telemetry evaluation.  No rubs, no gallops, no JVD. Gastrointestinal system:  Abdomen is nondistended, soft and nontender. No organomegaly or masses felt. Normal bowel sounds heard. Central nervous system: Moving 4 limbs spontaneously; no focal neurological deficits. Extremities: No cyanosis or clubbing. Skin: No petechiae. Psychiatry: No agitation or combative behavior appreciated at this time.     Data Reviewed: I have personally reviewed following labs and imaging studies  Basic Metabolic Panel: Recent Labs  Lab 06/01/20 1215 06/02/20 0430 06/03/20 0521 06/04/20 0528 06/05/20 0336  NA 132* 136 140 143 146*  K 4.0 3.8 3.9 3.8 3.6  CL 98 102 105 109 110  CO2 23 23 24 25 25   GLUCOSE 127* 137* 148* 151* 145*  BUN 22 23 35* 42* 45*  CREATININE 0.98 0.82 0.80 0.85 0.84  CALCIUM 8.6* 8.3* 8.2* 8.0* 8.3*  MG  --  2.3 2.7* 2.7* 2.6*  PHOS  --  3.1 4.1 3.0 3.6   Liver Function Tests: Recent Labs  Lab 06/01/20 1215 06/02/20 0430 06/03/20 0521 06/04/20 0528 06/05/20 0336  AST 38 29 25 23 26   ALT 41 36 37 30 32  ALKPHOS 124 106 112 110 108  BILITOT 0.8 0.6 0.6 0.5 1.1  PROT 7.7 6.6 6.3* 5.6* 6.2*  ALBUMIN 3.1* 2.6* 2.6* 2.4* 2.8*   Coagulation Profile: Recent Labs  Lab 06/01/20 1215  INR 1.2   CBC: Recent Labs  Lab 06/01/20 1215 06/02/20 0430 06/03/20 0521 06/04/20 0528 06/05/20 0336  WBC 22.3* 15.3* 17.2* 11.1* 12.5*  NEUTROABS 19.9* 13.9* 15.7* 10.1* 11.4*  HGB 14.2 13.1 13.1 12.6* 13.2  HCT 40.8 38.3* 39.8 36.8* 38.5*  MCV 97.1 97.0 100.5* 98.7 98.7  PLT 368 285 296 179 190   CBG: Recent Labs  Lab 06/05/20 0021 06/05/20 0437 06/05/20 0845 06/05/20 1234 06/05/20 1636  GLUCAP 133* 134* 133* 129* 120*   HbA1C: Recent Labs    06/03/20 0521 06/04/20 0528  HGBA1C 5.1 5.2   Urine analysis:    Component Value Date/Time   COLORURINE YELLOW 06/01/2020 1215   APPEARANCEUR HAZY (A) 06/01/2020 1215   LABSPEC 1.026 06/01/2020 1215   PHURINE 5.0 06/01/2020 1215   GLUCOSEU NEGATIVE 06/01/2020 1215   HGBUR NEGATIVE 06/01/2020  1215   BILIRUBINUR NEGATIVE 06/01/2020 1215   KETONESUR 5 (A) 06/01/2020 1215   PROTEINUR 100 (A) 06/01/2020 1215   UROBILINOGEN 0.2 02/12/2013 1150   NITRITE NEGATIVE 06/01/2020 1215   LEUKOCYTESUR NEGATIVE 06/01/2020 1215   Sepsis Labs:  Recent Results (from the past 240 hour(s))  Blood culture (routine single)     Status: None (Preliminary result)   Collection Time: 06/01/20 12:15 PM   Specimen: Right Antecubital; Blood  Result Value Ref Range Status   Specimen Description   Final    RIGHT ANTECUBITAL BOTTLES DRAWN AEROBIC AND ANAEROBIC   Special Requests Blood Culture adequate volume  Final   Culture   Final    NO GROWTH 4 DAYS Performed at [facility], [address], [place], [place] [address]    Report Status PENDING  Incomplete  Urine culture     Status: None   Collection Time: 06/01/20 12:15 PM   Specimen: In/Out Cath Urine  Result Value Ref Range Status   Specimen Description   Final    IN/OUT CATH URINE Performed at [facility], [address] [place], [place] [address]    Special Requests   Final    NONE Performed at [facility], [address] [place], [place] [address]    Culture   Final    NO GROWTH Performed at [facility], [place] [address] [place], [place] [address]    Report Status 06/03/2020 FINAL  Final  Blood Culture (routine x 2)     Status: None (Preliminary result)   Collection Time: 06/01/20 12:30 PM   Specimen: BLOOD RIGHT FOREARM  Result Value Ref Range Status   Specimen Description   Final    BLOOD RIGHT FOREARM BOTTLES DRAWN AEROBIC AND ANAEROBIC   Special Requests Blood Culture adequate volume  Final   Culture   Final    NO GROWTH 4 DAYS Performed at [facility], [address], [place], [place] [address]    Report Status PENDING  Incomplete  MRSA PCR Screening     Status: None   Collection Time: 06/01/20 12:40 PM   Specimen: Nasopharyngeal  Result Value Ref Range Status   MRSA by PCR NEGATIVE NEGATIVE Final    Comment:         The GeneXpert MRSA Assay (FDA approved for NASAL specimens only), is one component of a comprehensive MRSA colonization surveillance program. It is not intended to diagnose MRSA infection nor to guide or monitor treatment for MRSA infections. Performed at [facility], [address] [place], [place] [address]   MRSA PCR Screening     Status: None   Collection Time: 06/02/20  2:01 PM   Specimen: Nasopharyngeal  Result Value Ref Range Status   MRSA by PCR NEGATIVE NEGATIVE Final    Comment:  The GeneXpert MRSA Assay (FDA approved for NASAL specimens only), is one component of a comprehensive MRSA colonization surveillance program. It is not intended to diagnose MRSA infection nor to guide or monitor treatment for MRSA infections. Performed at [facility], [address], [place], [place] [address]      Scheduled Meds: . vitamin C  500 mg Oral Daily  . Chlorhexidine Gluconate Cloth  6 each Topical Daily  . enoxaparin (LOVENOX) injection  80 mg Subcutaneous Q12H  . folic acid  1 mg Intravenous Daily  . insulin aspart  0-9 Units Subcutaneous Q4H  . Ipratropium-Albuterol  1 puff Inhalation Q6H  . methylPREDNISolone (SOLU-MEDROL) injection  80 mg Intravenous Q12H  . pantoprazole (PROTONIX) IV  40 mg Intravenous QHS  . sodium chloride flush  3 mL Intravenous Q12H  . sodium chloride flush  3 mL Intravenous Q12H  . thiamine injection  100 mg Intravenous Daily  . zinc sulfate  220 mg Oral Daily   Continuous Infusions: . sodium chloride    . diltiazem (CARDIZEM) infusion 5 mg/hr (06/05/20 0752)  . sodium chloride      Procedures/Studies: CT Angio Chest PE W and/or Wo Contrast  Result Date: 06/01/2020 CLINICAL DATA:  cough, COVID+, elevated d dimer EXAM: CT ANGIOGRAPHY CHEST WITH CONTRAST TECHNIQUE: Multidetector CT imaging of the chest was performed using the standard protocol during bolus administration of intravenous contrast. Multiplanar CT image  reconstructions and MIPs were obtained to evaluate the vascular anatomy. CONTRAST:  30mL OMNIPAQUE IOHEXOL 350 MG/ML SOLN COMPARISON:  Xr chest 06/01/20, xr chest 02/12/13 FINDINGS: Cardiovascular: Satisfactory opacification of the pulmonary arteries to the segmental level. No evidence of pulmonary embolism. Enlarged right atria. Otherwise heart size. No significant pericardial effusion. The thoracic aorta is normal in caliber. Mild atherosclerotic plaque of the thoracic aorta. Mild 2 vessel coronary artery calcifications. Mediastinum/Nodes: No enlarged mediastinal, hilar, or axillary lymph nodes. Thyroid gland, trachea, and esophagus demonstrate no significant findings. Lungs/Pleura: Diffuse ground glass and consolidative opacities involving the majority of the lungs. Limited evaluation for pulmonary nodule due to minimal aerated lung. No definite pulmonary mass. No pleural effusion. No pneumothorax. Upper Abdomen: No acute abnormality. Musculoskeletal: No chest wall abnormality No suspicious lytic or blastic osseous lesions. No acute displaced fracture. Multilevel degenerative changes of the spine. Review of the MIP images confirms the above findings. IMPRESSION: 1. No pulmonary embolus. 2. Diffuse multifocal pneumonia in the setting of COVID-19 infection. 3.  Aortic Atherosclerosis (ICD10-I70.0). Electronically Signed   By: Iven Finn M.D.   On: 06/01/2020 15:26   DG Chest Port 1 View  Result Date: 06/01/2020 CLINICAL DATA:  PT on Bipap unable to give hx. Positive Covid test. Hx of CA. ER notes:Pt brought in by [facility] from home with c/o SOB and productive green sputum x 7 days. EMS arrived and pt was found to be 67% on RA. Pt was placed on CPAP and sats increased to 85%. Pt was also given 4 tablets of Nitro by EMS. EMS reports crackles in all lung fields. HR 150, resp 50 per EMS EXAM: PORTABLE CHEST 1 VIEW COMPARISON:  02/12/2013 FINDINGS: Bilateral hazy airspace lung opacities are noted consistent  multifocal pneumonia. No evidence of pulmonary edema. No pleural effusion or pneumothorax. Cardiac silhouette is normal in size. No mediastinal or hilar masses. Skeletal structures are grossly intact. IMPRESSION: Bilateral hazy airspace lung opacities consistent with multifocal pneumonia, pattern consistent with atypical/viral infection including COVID 19. Electronically Signed   By: Dedra Skeens.D.  On: 06/01/2020 12:37   ECHOCARDIOGRAM COMPLETE  Result Date: 06/02/2020    ECHOCARDIOGRAM REPORT   Patient Name:   JADA KUHNERT Date of Exam: 06/02/2020 Medical Rec #:  [id number]         Height:       68.0 in Accession #:    [id number]        Weight:       165.0 lb Date of Birth:  [birth year]         BSA:          1.883 m Patient Age:    89 years          BP:           107/75 mmHg Patient Gender: M                 HR:           101 bpm. Exam Location:  [facility] Procedure: 2D Echo, Cardiac Doppler and Color Doppler Indications:    Dyspnea R06.00  History:        Patient has no prior history of Echocardiogram examinations.                 Arrythmias:Atrial Fibrillation. Pneumonia due to COVID-19 virus.  Sonographer:    Alvino Chapel RCS Referring Phys: [phone] COURAGE EMOKPAE IMPRESSIONS  1. Left ventricular ejection fraction, by estimation, is 50 to 55%. The left ventricle has low normal function. The left ventricle has no regional wall motion abnormalities. Left ventricular diastolic parameters are indeterminate.  2. Right ventricular systolic function is normal. The right ventricular size is normal. There is moderately elevated pulmonary artery systolic pressure. The estimated right ventricular systolic pressure is 84.6 mmHg.  3. Left atrial size was mildly dilated.  4. Right atrial size was moderately dilated.  5. There is a trivial pericardial effusion posterior to the left ventricle.  6. The mitral valve is grossly normal. Mild mitral valve regurgitation.  7. Tricuspid valve regurgitation is moderate.   8. The aortic valve is tricuspid. There is mild calcification of the aortic valve. Aortic valve regurgitation is not visualized.  9. The inferior vena cava is dilated in size with <50% respiratory variability, suggesting right atrial pressure of 15 mmHg. FINDINGS  Left Ventricle: Left ventricular ejection fraction, by estimation, is 50 to 55%. The left ventricle has low normal function. The left ventricle has no regional wall motion abnormalities. The left ventricular internal cavity size was normal in size. There is no left ventricular hypertrophy. Left ventricular diastolic parameters are indeterminate. Right Ventricle: The right ventricular size is normal. No increase in right ventricular wall thickness. Right ventricular systolic function is normal. There is moderately elevated pulmonary artery systolic pressure. The tricuspid regurgitant velocity is 2.96 m/s, and with an assumed right atrial pressure of 15 mmHg, the estimated right ventricular systolic pressure is 96.2 mmHg. Left Atrium: Left atrial size was mildly dilated. Right Atrium: Right atrial size was moderately dilated. Pericardium: Trivial pericardial effusion is present. The pericardial effusion is posterior to the left ventricle. Mitral Valve: The mitral valve is grossly normal. Mild to moderate mitral annular calcification. Mild mitral valve regurgitation. Tricuspid Valve: The tricuspid valve is grossly normal. Tricuspid valve regurgitation is moderate. Aortic Valve: The aortic valve is tricuspid. There is mild calcification of the aortic valve. There is mild to moderate aortic valve annular calcification. Aortic valve regurgitation is not visualized. Pulmonic Valve: The pulmonic valve was grossly normal. Pulmonic valve regurgitation is trivial.  Aorta: The aortic root is normal in size and structure. Venous: The inferior vena cava is dilated in size with less than 50% respiratory variability, suggesting right atrial pressure of 15 mmHg. IAS/Shunts:  No atrial level shunt detected by color flow Doppler.  LEFT VENTRICLE PLAX 2D LVIDd:         4.50 cm LVIDs:         3.00 cm LV PW:         0.90 cm LV IVS:        1.00 cm LVOT diam:     2.10 cm LV SV:         42 LV SV Index:   22 LVOT Area:     3.46 cm  RIGHT VENTRICLE TAPSE (M-mode): 1.4 cm LEFT ATRIUM             Index       RIGHT ATRIUM           Index LA diam:        3.80 cm 2.02 cm/m  RA Area:     25.60 cm LA Vol (A2C):   59.9 ml 31.81 ml/m RA Volume:   86.30 ml  45.82 ml/m LA Vol (A4C):   66.5 ml 35.31 ml/m LA Biplane Vol: 67.7 ml 35.95 ml/m  AORTIC VALVE LVOT Vmax:   64.30 cm/s LVOT Vmean:  39.700 cm/s LVOT VTI:    0.120 m  AORTA Ao Root diam: 3.60 cm MITRAL VALVE                TRICUSPID VALVE MV Area (PHT): 4.17 cm     TR Peak grad:   35.0 mmHg MV Decel Time: 182 msec     TR Vmax:        296.00 cm/s MV E velocity: 139.00 cm/s                             SHUNTS                             Systemic VTI:  0.12 m                             Systemic Diam: 2.10 cm Rozann Lesches MD Electronically signed by Rozann Lesches MD Signature Date/Time: 06/02/2020/3:26:04 PM    Final     CRITICAL CARE Performed by: Barton Dubois MD [facility] hospitalist    Total critical care time: 35 minutes  Critical care time was exclusive of separately billable procedures and treating other patients.  Critical care was necessary to treat or prevent imminent or life-threatening deterioration.  Critical care was time spent personally by me on the following activities: development of treatment plan with patient and/or surrogate as well as nursing, discussions with consultants, evaluation of patient's response to treatment, examination of patient, obtaining history from patient or surrogate, ordering and performing treatments and interventions, ordering and review of laboratory studies, ordering and review of radiographic studies, pulse oximetry and re-evaluation of patient's condition.    06/05/2020, 6:05 PM   LOS:  4 days

## 2020-06-05 NOTE — Progress Notes (Signed)
Ongoing discussion about restraining patient while on BIPAP. MD notified. MD stated to this RN that the benefits outweigh the risks. MD stated he did not want to sedate patient as that would be a higher concern for aspiration on BIPAP. Per MD attempt to wean off BIPAP to heated high flow. If unable to wean to HFNC patient can still wear bilateral wrist restraints. MD stated that he would reach out to family regarding patient needing restraints due to being a danger to one self. Patient's oxygen drops tremendously when BIPAP removed, dropping down into low 70s and high 60s. Will continue to attempt to remove restraints and continue to closely monitor patient.

## 2020-06-05 NOTE — Progress Notes (Signed)
**Note De-Identified  Obfuscation** Patient removed from BIPAP and placed on HHFNC 25 L 100%, SAT 100%, RR 30. Tolerating well.  RRT to continue to monitor.

## 2020-06-06 ENCOUNTER — Encounter (HOSPITAL_COMMUNITY): Payer: Self-pay | Admitting: Family Medicine

## 2020-06-06 ENCOUNTER — Inpatient Hospital Stay (HOSPITAL_COMMUNITY): Payer: Medicare Other

## 2020-06-06 DIAGNOSIS — U071 COVID-19: Secondary | ICD-10-CM | POA: Diagnosis not present

## 2020-06-06 DIAGNOSIS — J069 Acute upper respiratory infection, unspecified: Secondary | ICD-10-CM | POA: Diagnosis not present

## 2020-06-06 DIAGNOSIS — I4891 Unspecified atrial fibrillation: Secondary | ICD-10-CM | POA: Diagnosis not present

## 2020-06-06 DIAGNOSIS — J9601 Acute respiratory failure with hypoxia: Secondary | ICD-10-CM | POA: Diagnosis not present

## 2020-06-06 DIAGNOSIS — Z7189 Other specified counseling: Secondary | ICD-10-CM | POA: Diagnosis not present

## 2020-06-06 DIAGNOSIS — Z515 Encounter for palliative care: Secondary | ICD-10-CM | POA: Diagnosis not present

## 2020-06-06 LAB — CBC WITH DIFFERENTIAL/PLATELET
Abs Immature Granulocytes: 0.25 10*3/uL — ABNORMAL HIGH (ref 0.00–0.07)
Basophils Absolute: 0 10*3/uL (ref 0.0–0.1)
Basophils Relative: 0 %
Eosinophils Absolute: 0 10*3/uL (ref 0.0–0.5)
Eosinophils Relative: 0 %
HCT: 39.8 % (ref 39.0–52.0)
Hemoglobin: 13.3 g/dL (ref 13.0–17.0)
Immature Granulocytes: 2 %
Lymphocytes Relative: 4 %
Lymphs Abs: 0.5 10*3/uL — ABNORMAL LOW (ref 0.7–4.0)
MCH: 33.1 pg (ref 26.0–34.0)
MCHC: 33.4 g/dL (ref 30.0–36.0)
MCV: 99 fL (ref 80.0–100.0)
Monocytes Absolute: 0.5 10*3/uL (ref 0.1–1.0)
Monocytes Relative: 4 %
Neutro Abs: 11.1 10*3/uL — ABNORMAL HIGH (ref 1.7–7.7)
Neutrophils Relative %: 90 %
Platelets: 204 10*3/uL (ref 150–400)
RBC: 4.02 MIL/uL — ABNORMAL LOW (ref 4.22–5.81)
RDW: 13.1 % (ref 11.5–15.5)
WBC: 12.2 10*3/uL — ABNORMAL HIGH (ref 4.0–10.5)
nRBC: 0 % (ref 0.0–0.2)

## 2020-06-06 LAB — GLUCOSE, CAPILLARY
Glucose-Capillary: 137 mg/dL — ABNORMAL HIGH (ref 70–99)
Glucose-Capillary: 120 mg/dL — ABNORMAL HIGH (ref 70–99)
Glucose-Capillary: 112 mg/dL — ABNORMAL HIGH (ref 70–99)
Glucose-Capillary: 125 mg/dL — ABNORMAL HIGH (ref 70–99)
Glucose-Capillary: 116 mg/dL — ABNORMAL HIGH (ref 70–99)
Glucose-Capillary: 121 mg/dL — ABNORMAL HIGH (ref 70–99)

## 2020-06-06 LAB — COMPREHENSIVE METABOLIC PANEL
ALT: 43 U/L (ref 0–44)
AST: 31 U/L (ref 15–41)
Albumin: 2.9 g/dL — ABNORMAL LOW (ref 3.5–5.0)
Alkaline Phosphatase: 94 U/L (ref 38–126)
Anion gap: 12 (ref 5–15)
BUN: 44 mg/dL — ABNORMAL HIGH (ref 8–23)
CO2: 25 mmol/L (ref 22–32)
Calcium: 8.1 mg/dL — ABNORMAL LOW (ref 8.9–10.3)
Chloride: 111 mmol/L (ref 98–111)
Creatinine, Ser: 0.75 mg/dL (ref 0.61–1.24)
GFR, Estimated: >60 mL/min (ref >60)
Glucose, Bld: 131 mg/dL — ABNORMAL HIGH (ref 70–99)
Potassium: 3.6 mmol/L (ref 3.5–5.1)
Sodium: 148 mmol/L — ABNORMAL HIGH (ref 135–145)
Total Bilirubin: 1.4 mg/dL — ABNORMAL HIGH (ref 0.3–1.2)
Total Protein: 6.1 g/dL — ABNORMAL LOW (ref 6.5–8.1)

## 2020-06-06 LAB — CULTURE, BLOOD (ROUTINE X 2)
Culture: NO GROWTH
Special Requests: ADEQUATE

## 2020-06-06 LAB — FERRITIN: Ferritin: 549 ng/mL — ABNORMAL HIGH (ref 24–336)

## 2020-06-06 LAB — MAGNESIUM: Magnesium: 2.6 mg/dL — ABNORMAL HIGH (ref 1.7–2.4)

## 2020-06-06 LAB — PHOSPHORUS: Phosphorus: 3.8 mg/dL (ref 2.5–4.6)

## 2020-06-06 LAB — D-DIMER, QUANTITATIVE: D-Dimer, Quant: >20 ug/mL-FEU — ABNORMAL HIGH (ref 0.00–0.50)

## 2020-06-06 LAB — CULTURE, BLOOD (SINGLE)
Culture: NO GROWTH
Special Requests: ADEQUATE

## 2020-06-06 LAB — C-REACTIVE PROTEIN: CRP: 3.5 mg/dL — ABNORMAL HIGH (ref <1.0)

## 2020-06-06 MED ORDER — ORAL CARE MOUTH RINSE
15.0000 mL | Freq: Two times a day (BID) | OROMUCOSAL | Status: DC
  Administered 2020-06-06 – 2020-06-18 (×25): 15 mL via OROMUCOSAL

## 2020-06-06 MED ORDER — METOPROLOL TARTRATE 5 MG/5ML IV SOLN
5.0000 mg | Freq: Three times a day (TID) | INTRAVENOUS | Status: DC
  Administered 2020-06-06: 5 mg via INTRAVENOUS
  Filled 2020-06-06: qty 5

## 2020-06-06 NOTE — Progress Notes (Signed)
Initial Nutrition Assessment  DOCUMENTATION CODES:   Not applicable  INTERVENTION:  If unable to advance diet within the next 48 hours, consider placing NG tube and initiating  -Vital 1.2 @ 20 ml/hr, advance 10 ml every 6 hours to goal rate 60 ml/hr (1440 ml/day) -ProSource TF 45 ml via tube TID  Regimen at goal rate provides 2280 kcal, 123 grams of protein, and 1094 ml H2O  (2294 ml total H20 with recommended additional free water)  Additional free water per MD, recommend 200 ml Q4H   Pt is at high risk for refeeding, recommend monitoring magnesium, potassium, and phosphorus daily with diet advancement/initiation of nutrition support given pt NPO x 5 days.   NUTRITION DIAGNOSIS:   Inadequate oral intake related to acute illness (acute respiratory failure with hypoxia due to Covid PNA) as evidenced by NPO status.    GOAL:   Patient will meet greater than or equal to 90% of their needs    MONITOR:   Labs,I & O's,Diet advancement,Skin,Weight trends  REASON FOR ASSESSMENT:   NPO/Clear Liquid Diet    ASSESSMENT:  86 year old male admitted with acute respiratory failure with hypoxia secondary to pneumonia due to Covid-19 virus. Pt is a reformed smoker without any significant past medical history presented with one week history of significant dyspnea and productive cough with green sputum.  1/16-admit  Patient with severe persistent hypoxia with altered mentation, failed HFNC requiring continuous BiPAP since admit. Pt is off BiPAP today, noted slowly waking up. He has been NPO x 5 days. Given catabolic nature of [id number] virus, pt is at high risk for malnutrition. If unable to advance diet within the next 48 hours, recommend consideration of placing NG tube and intiating nutrition support with regimen as outlined above.   No recent wt history for review.  I/Os: -1485 ml since admit UOP: 900 ml x 24 hrs  Medications reviewed and include: Folic acid, SSI, Methylprednisolone,  Protonix, Thiamine  Labs: CBGs 116,125,112,120, Na 148 (H), BUN 44 (H), Mg 2.6 (H), WBC 12.2 (H)  NUTRITION - FOCUSED PHYSICAL EXAM:  Unable to complete at this time  Diet Order:   Diet Order            Diet NPO time specified Except for: Sips with Meds  Diet effective now                 EDUCATION NEEDS:   No education needs have been identified at this time  Skin:  Skin Assessment: Reviewed RN Assessment  Last BM:  1/20-type 6 (brown;med)  Height:   Ht Readings from Last 1 Encounters:  06/02/20 5\' 8"  (1.727 m)    Weight:   Wt Readings from Last 1 Encounters:  06/04/20 76.7 kg     BMI:  Body mass index is 25.71 kg/m.  Estimated Nutritional Needs:   Kcal:  9150-5697  Protein:  115-130  Fluid:  2.3 L   Lajuan Lines, RD, LDN Clinical Nutrition After Hours/Weekend Pager # in [place]

## 2020-06-06 NOTE — Progress Notes (Signed)
PROGRESS NOTE  Joshua Robinson [id number] DOB: 1934/06/08 DOA: 06/01/2020 PCP: Redmond School, MD  Brief History:  86 year old male with no documented chronic medical problems, but is a reformed smoker of 50 pack years presenting with myalgias, coughing, and shortness of breath that began on 05/24/2020.  The patient states that he visited his PCP on a virtual visit on 05/27/2019.  He was given oral steroids and azithromycin.  His symptoms did not improve.  He continued to have worsening shortness of breath, coughing.  He did have some intermittent chest discomfort with coughing but denied any hemoptysis.  He had some loose stools but did not have any hematochezia or melena.  He also had subjective fevers and chills at home.  He has had 2 doses of the Moderna vaccination but has not been boosted.  EMS was activated and the patient was noted to have oxygen saturation 67% on room air.  He was placed on CPAP and brought to the hospital.  In the emergency department, the patient was subsequently placed on high flow nasal cannula.  He did not tolerate it well and was placed on BiPAP.  He has been subsequently transitioned to heated high flow at 40 L.  The patient was given Actemra and started on intravenous steroids. In addition, the patient was noted to have atrial fibrillation with RVR.  He was started on a diltiazem drip.  Assessment/Plan: Acute respiratory failure with hypoxia secondary to COVID-19 - still requiring BiPAP at 50% FiO2 with saturations 90-92% -Continue intravenous Solu-Medrol and as needed bronchodilator. -received Actemra on 06/01/20 -Continue to follow inflammatory markers. -Personally reviewed chest x-ray--diffuse bilateral opacities, requiring left -06/02/2020 CTA chest--negative PE, diffuse groundglass opacities and consolidative opacities -Continue Vitamin C and zinc when able to take PO's -Will work as an outpatient able to cooperate with proning position, as well  as using incentive spirometer and flutter valve. -Continue holding antibiotics at this time; procalcitonin level not elevated. -Patient is afebrile. -Follow blood cultures--neg growth to date -Will continue avoiding as much as possible the use of sedative agents -Despite the risk of potential aspiration while on BiPAP in case he ended vomiting, pursued the use of soft restraints.  Atrial fibrillation with RVR -Continue diltiazem drip for now -Transition to oral diltiazem once respiratory status has stabilized and patient can reliably take p.o. -IV metoprolol added to assist  with transition.  -Echo--EF 50-55%, no WMA, RVSP 50 -TSH--0.124 -CHADS-VASc= 3 (age, ASVD) -Continue Lovenox; when able to tolerate p.o. might be a good candidate for Eliquis. -Patient reviewed EKG--atrial fibrillation, nonspecific ST-T wave change  Hyponatremia -Secondary to volume depletion and poor solute intake -Continue judicious IV fluids and follow electrolytes trend.  Elevated troponin -Secondary to demand ischemia due to acute medical illness as well as atrial fibrillation. -No chest pain presently -Echocardiogram--EF 50-55%, no WMA, moderately elevated PASP (50), mod TR  Hyperglycemia -Likely steroid-induced -A1c 5.2 -NovoLog sliding scale  Presumptive COPD -Patient has had 50-pack-year history of tobacco -Quit tobacco 20 years ago -Continue Combivent -Continue steroids as mentioned above.  [place] -Advance care planning, including the explanation and discussion of advance directives was carried out with the patient and family.  Code status including explanations of "Full Code" and "DNR" and alternatives were discussed in detail.  Discussion of end-of-life issues including but not limited palliative care, hospice care and the concept of hospice, other end-of-life care options, power of attorney for health care decisions, living wills, and  physician orders for life-sustaining treatment were also  discussed with the patient and family.  Total face to face time 16 minutes. -spouse wants full scope of care -palliative medicine consult; will continue to follow [place] discussion outcome.    Status is: Inpatient  Remains inpatient appropriate because:IV treatments appropriate due to intensity of illness or inability to take PO   Dispo: The patient is from: Home  Anticipated d/c is to: Home  Anticipated d/c date is: 3-4 days  Patient currently is not medically stable to d/c.    Family Communication:   Patient's plan of care discussed over the phone with wife 06/05/2020.  Consultants:   Palliative care service  Code Status:  FULL-confirmed with patient  DVT Prophylaxis:  lovenox treatment dose   Procedures: As Listed in Progress Note Above  Antibiotics: Ceftriaxone 1/16>> azithro 1/16>> vanco 1/16   Subjective: Off BIPAP; Still with Cardizem drip on board.  Slowly waking up.   Objective: Vitals:   06/06/20 0545 06/06/20 0600 06/06/20 0615 06/06/20 0630  BP: (!) 175/84 (!) 168/106 (!) 156/92 (!) 159/101  Pulse: (!) 40 (!) 102 (!) 116 (!) 121  Resp: (!) 25 (!) 22 (!) 21 (!) 25  Temp: 98.42 F (36.9 C) 98.42 F (36.9 C) 98.6 F (37 C) 98.6 F (37 C)  TempSrc:      SpO2: 94% 94% 93% 94%  Weight:      Height:        Intake/Output Summary (Last 24 hours) at 06/06/2020 1514 Last data filed at 06/06/2020 0400 Gross per 24 hour  Intake --  Output 900 ml  Net -900 ml   Weight change:   Exam: General exam: Alert, awake, oriented x1; slightly somnolent but following simple commands.  Off BiPAP and using 30 L heated high flow. Respiratory system: No using accessory muscles; no wheezing or crackles appreciated on exam.  Positive rhonchi bilaterally. Cardiovascular system: Atrial fibrillation appreciated.  No murmurs, rubs or gallops.  No JVD Gastrointestinal system: Abdomen is nondistended, soft and nontender. No  organomegaly or masses felt. Normal bowel sounds heard. Central nervous system: No focal neurological deficits. Extremities: No cyanosis or clubbing. Skin: No petechiae. Psychiatry: Unable to properly assess judgment and insight; no agitation.  Data Reviewed: I have personally reviewed following labs and imaging studies  Basic Metabolic Panel: Recent Labs  Lab 06/02/20 0430 06/03/20 0521 06/04/20 0528 06/05/20 0336 06/06/20 0432  NA 136 140 143 146* 148*  K 3.8 3.9 3.8 3.6 3.6  CL 102 105 109 110 111  CO2 23 24 25 25 25   GLUCOSE 137* 148* 151* 145* 131*  BUN 23 35* 42* 45* 44*  CREATININE 0.82 0.80 0.85 0.84 0.75  CALCIUM 8.3* 8.2* 8.0* 8.3* 8.1*  MG 2.3 2.7* 2.7* 2.6* 2.6*  PHOS 3.1 4.1 3.0 3.6 3.8   Liver Function Tests: Recent Labs  Lab 06/02/20 0430 06/03/20 0521 06/04/20 0528 06/05/20 0336 06/06/20 0432  AST 29 25 23 26 31   ALT 36 37 30 32 43  ALKPHOS 106 112 110 108 94  BILITOT 0.6 0.6 0.5 1.1 1.4*  PROT 6.6 6.3* 5.6* 6.2* 6.1*  ALBUMIN 2.6* 2.6* 2.4* 2.8* 2.9*   Coagulation Profile: Recent Labs  Lab 06/01/20 1215  INR 1.2   CBC: Recent Labs  Lab 06/02/20 0430 06/03/20 0521 06/04/20 0528 06/05/20 0336 06/06/20 0432  WBC 15.3* 17.2* 11.1* 12.5* 12.2*  NEUTROABS 13.9* 15.7* 10.1* 11.4* 11.1*  HGB 13.1 13.1 12.6* 13.2 13.3  HCT 38.3* 39.8 36.8*  38.5* 39.8  MCV 97.0 100.5* 98.7 98.7 99.0  PLT 285 296 179 190 204   CBG: Recent Labs  Lab 06/05/20 2050 06/06/20 0059 06/06/20 0454 06/06/20 0747 06/06/20 1142  GLUCAP 117* 137* 120* 112* 125*   HbA1C: Recent Labs    06/04/20 0528  HGBA1C 5.2   Urine analysis:    Component Value Date/Time   COLORURINE YELLOW 06/01/2020 1215   APPEARANCEUR HAZY (A) 06/01/2020 1215   LABSPEC 1.026 06/01/2020 1215   PHURINE 5.0 06/01/2020 1215   GLUCOSEU NEGATIVE 06/01/2020 1215   HGBUR NEGATIVE 06/01/2020 1215   BILIRUBINUR NEGATIVE 06/01/2020 1215   KETONESUR 5 (A) 06/01/2020 1215   PROTEINUR 100  (A) 06/01/2020 1215   UROBILINOGEN 0.2 02/12/2013 1150   NITRITE NEGATIVE 06/01/2020 1215   LEUKOCYTESUR NEGATIVE 06/01/2020 1215   Sepsis Labs:  Recent Results (from the past 240 hour(s))  Blood culture (routine single)     Status: None   Collection Time: 06/01/20 12:15 PM   Specimen: Right Antecubital; Blood  Result Value Ref Range Status   Specimen Description   Final    RIGHT ANTECUBITAL BOTTLES DRAWN AEROBIC AND ANAEROBIC   Special Requests Blood Culture adequate volume  Final   Culture   Final    NO GROWTH 5 DAYS Performed at [facility], [address], [place], [place] [address]    Report Status 06/06/2020 FINAL  Final  Urine culture     Status: None   Collection Time: 06/01/20 12:15 PM   Specimen: In/Out Cath Urine  Result Value Ref Range Status   Specimen Description   Final    IN/OUT CATH URINE Performed at [facility], [address] [place], [place] [address]    Special Requests   Final    NONE Performed at [facility], [address] [place], [place] [address]    Culture   Final    NO GROWTH Performed at [facility], [place] [address] [place], [place] [address]    Report Status 06/03/2020 FINAL  Final  Blood Culture (routine x 2)     Status: None   Collection Time: 06/01/20 12:30 PM   Specimen: BLOOD RIGHT FOREARM  Result Value Ref Range Status   Specimen Description   Final    BLOOD RIGHT FOREARM BOTTLES DRAWN AEROBIC AND ANAEROBIC   Special Requests Blood Culture adequate volume  Final   Culture   Final    NO GROWTH 5 DAYS Performed at [facility], [address], [place], [place] [address]    Report Status 06/06/2020 FINAL  Final  MRSA PCR Screening     Status: None   Collection Time: 06/01/20 12:40 PM   Specimen: Nasopharyngeal  Result Value Ref Range Status   MRSA by PCR NEGATIVE NEGATIVE Final    Comment:        The GeneXpert MRSA Assay (FDA approved for NASAL specimens only), is one component of a comprehensive MRSA  colonization surveillance program. It is not intended to diagnose MRSA infection nor to guide or monitor treatment for MRSA infections. Performed at [facility], [address], [place], [place] [address]   MRSA PCR Screening     Status: None   Collection Time: 06/02/20  2:01 PM   Specimen: Nasopharyngeal  Result Value Ref Range Status   MRSA by PCR NEGATIVE NEGATIVE Final    Comment:        The GeneXpert MRSA Assay (FDA approved for NASAL specimens only), is one component of a  comprehensive MRSA colonization surveillance program. It is not intended to diagnose MRSA infection nor to guide or monitor treatment for MRSA infections. Performed at [facility], [address] [place], [place] [address]      Scheduled Meds: . vitamin C  500 mg Oral Daily  . Chlorhexidine Gluconate Cloth  6 each Topical Daily  . enoxaparin (LOVENOX) injection  80 mg Subcutaneous Q12H  . folic acid  1 mg Intravenous Daily  . insulin aspart  0-9 Units Subcutaneous Q4H  . mouth rinse  15 mL Mouth Rinse BID  . methylPREDNISolone (SOLU-MEDROL) injection  80 mg Intravenous Q12H  . pantoprazole (PROTONIX) IV  40 mg Intravenous QHS  . sodium chloride flush  3 mL Intravenous Q12H  . sodium chloride flush  3 mL Intravenous Q12H  . thiamine injection  100 mg Intravenous Daily  . zinc sulfate  220 mg Oral Daily   Continuous Infusions: . sodium chloride    . diltiazem (CARDIZEM) infusion 5 mg/hr (06/05/20 0752)  . sodium chloride      Procedures/Studies: CT Angio Chest PE W and/or Wo Contrast  Result Date: 06/01/2020 CLINICAL DATA:  cough, COVID+, elevated d dimer EXAM: CT ANGIOGRAPHY CHEST WITH CONTRAST TECHNIQUE: Multidetector CT imaging of the chest was performed using the standard protocol during bolus administration of intravenous contrast. Multiplanar CT image reconstructions and MIPs were obtained to evaluate the vascular anatomy. CONTRAST:  46mL OMNIPAQUE IOHEXOL 350 MG/ML SOLN COMPARISON:  Xr  chest 06/01/20, xr chest 02/12/13 FINDINGS: Cardiovascular: Satisfactory opacification of the pulmonary arteries to the segmental level. No evidence of pulmonary embolism. Enlarged right atria. Otherwise heart size. No significant pericardial effusion. The thoracic aorta is normal in caliber. Mild atherosclerotic plaque of the thoracic aorta. Mild 2 vessel coronary artery calcifications. Mediastinum/Nodes: No enlarged mediastinal, hilar, or axillary lymph nodes. Thyroid gland, trachea, and esophagus demonstrate no significant findings. Lungs/Pleura: Diffuse ground glass and consolidative opacities involving the majority of the lungs. Limited evaluation for pulmonary nodule due to minimal aerated lung. No definite pulmonary mass. No pleural effusion. No pneumothorax. Upper Abdomen: No acute abnormality. Musculoskeletal: No chest wall abnormality No suspicious lytic or blastic osseous lesions. No acute displaced fracture. Multilevel degenerative changes of the spine. Review of the MIP images confirms the above findings. IMPRESSION: 1. No pulmonary embolus. 2. Diffuse multifocal pneumonia in the setting of COVID-19 infection. 3.  Aortic Atherosclerosis (ICD10-I70.0). Electronically Signed   By: Iven Finn M.D.   On: 06/01/2020 15:26   DG Chest Port 1 View  Result Date: 06/01/2020 CLINICAL DATA:  PT on Bipap unable to give hx. Positive Covid test. Hx of CA. ER notes:Pt brought in by [facility] from home with c/o SOB and productive green sputum x 7 days. EMS arrived and pt was found to be 67% on RA. Pt was placed on CPAP and sats increased to 85%. Pt was also given 4 tablets of Nitro by EMS. EMS reports crackles in all lung fields. HR 150, resp 50 per EMS EXAM: PORTABLE CHEST 1 VIEW COMPARISON:  02/12/2013 FINDINGS: Bilateral hazy airspace lung opacities are noted consistent multifocal pneumonia. No evidence of pulmonary edema. No pleural effusion or pneumothorax. Cardiac silhouette is normal in size. No mediastinal or  hilar masses. Skeletal structures are grossly intact. IMPRESSION: Bilateral hazy airspace lung opacities consistent with multifocal pneumonia, pattern consistent with atypical/viral infection including COVID 19. Electronically Signed   By: Lajean Manes M.D.   On: 06/01/2020 12:37   ECHOCARDIOGRAM COMPLETE  Result Date: 06/02/2020  ECHOCARDIOGRAM REPORT   Patient Name:   Joshua Robinson Date of Exam: 06/02/2020 Medical Rec #:  [id number]         Height:       68.0 in Accession #:    [id number]        Weight:       165.0 lb Date of Birth:  04-Sep-1934         BSA:          1.883 m Patient Age:    60 years          BP:           107/75 mmHg Patient Gender: M                 HR:           101 bpm. Exam Location:  [facility] Procedure: 2D Echo, Cardiac Doppler and Color Doppler Indications:    Dyspnea R06.00  History:        Patient has no prior history of Echocardiogram examinations.                 Arrythmias:Atrial Fibrillation. Pneumonia due to COVID-19 virus.  Sonographer:    Alvino Chapel RCS Referring Phys: [phone] COURAGE EMOKPAE IMPRESSIONS  1. Left ventricular ejection fraction, by estimation, is 50 to 55%. The left ventricle has low normal function. The left ventricle has no regional wall motion abnormalities. Left ventricular diastolic parameters are indeterminate.  2. Right ventricular systolic function is normal. The right ventricular size is normal. There is moderately elevated pulmonary artery systolic pressure. The estimated right ventricular systolic pressure is [id number] mmHg.  3. Left atrial size was mildly dilated.  4. Right atrial size was moderately dilated.  5. There is a trivial pericardial effusion posterior to the left ventricle.  6. The mitral valve is grossly normal. Mild mitral valve regurgitation.  7. Tricuspid valve regurgitation is moderate.  8. The aortic valve is tricuspid. There is mild calcification of the aortic valve. Aortic valve regurgitation is not visualized.  9. The inferior  vena cava is dilated in size with <50% respiratory variability, suggesting right atrial pressure of 15 mmHg. FINDINGS  Left Ventricle: Left ventricular ejection fraction, by estimation, is 50 to 55%. The left ventricle has low normal function. The left ventricle has no regional wall motion abnormalities. The left ventricular internal cavity size was normal in size. There is no left ventricular hypertrophy. Left ventricular diastolic parameters are indeterminate. Right Ventricle: The right ventricular size is normal. No increase in right ventricular wall thickness. Right ventricular systolic function is normal. There is moderately elevated pulmonary artery systolic pressure. The tricuspid regurgitant velocity is 2.96 m/s, and with an assumed right atrial pressure of 15 mmHg, the estimated right ventricular systolic pressure is Q000111Q mmHg. Left Atrium: Left atrial size was mildly dilated. Right Atrium: Right atrial size was moderately dilated. Pericardium: Trivial pericardial effusion is present. The pericardial effusion is posterior to the left ventricle. Mitral Valve: The mitral valve is grossly normal. Mild to moderate mitral annular calcification. Mild mitral valve regurgitation. Tricuspid Valve: The tricuspid valve is grossly normal. Tricuspid valve regurgitation is moderate. Aortic Valve: The aortic valve is tricuspid. There is mild calcification of the aortic valve. There is mild to moderate aortic valve annular calcification. Aortic valve regurgitation is not visualized. Pulmonic Valve: The pulmonic valve was grossly normal. Pulmonic valve regurgitation is trivial. Aorta: The aortic root is normal in size and structure. Venous: The inferior vena  cava is dilated in size with less than 50% respiratory variability, suggesting right atrial pressure of 15 mmHg. IAS/Shunts: No atrial level shunt detected by color flow Doppler.  LEFT VENTRICLE PLAX 2D LVIDd:         4.50 cm LVIDs:         3.00 cm LV PW:         0.90  cm LV IVS:        1.00 cm LVOT diam:     2.10 cm LV SV:         42 LV SV Index:   22 LVOT Area:     3.46 cm  RIGHT VENTRICLE TAPSE (M-mode): 1.4 cm LEFT ATRIUM             Index       RIGHT ATRIUM           Index LA diam:        3.80 cm 2.02 cm/m  RA Area:     25.60 cm LA Vol (A2C):   59.9 ml 31.81 ml/m RA Volume:   86.30 ml  45.82 ml/m LA Vol (A4C):   66.5 ml 35.31 ml/m LA Biplane Vol: 67.7 ml 35.95 ml/m  AORTIC VALVE LVOT Vmax:   64.30 cm/s LVOT Vmean:  39.700 cm/s LVOT VTI:    0.120 m  AORTA Ao Root diam: 3.60 cm MITRAL VALVE                TRICUSPID VALVE MV Area (PHT): 4.17 cm     TR Peak grad:   35.0 mmHg MV Decel Time: 182 msec     TR Vmax:        296.00 cm/s MV E velocity: 139.00 cm/s                             SHUNTS                             Systemic VTI:  0.12 m                             Systemic Diam: 2.10 cm Rozann Lesches MD Electronically signed by Rozann Lesches MD Signature Date/Time: 06/02/2020/3:26:04 PM    Final     CRITICAL CARE Performed by: Barton Dubois MD [facility] hospitalist    Total critical care time: 35 minutes  Critical care time was exclusive of separately billable procedures and treating other patients.  Critical care was necessary to treat or prevent imminent or life-threatening deterioration.  Critical care was time spent personally by me on the following activities: development of treatment plan with patient and/or surrogate as well as nursing, discussions with consultants, evaluation of patient's response to treatment, examination of patient, obtaining history from patient or surrogate, ordering and performing treatments and interventions, ordering and review of laboratory studies, ordering and review of radiographic studies, pulse oximetry and re-evaluation of patient's condition.    06/06/2020, 3:14 PM   LOS: 5 days

## 2020-06-06 NOTE — Progress Notes (Addendum)
[facility] night shift.  The staff reports that while they were removing the restraints on the patient as he was more alert, oriented to name and place they noticed that his left-sided extremities were not moving and had no resistance to gravity at all.  He was last seen at his previous baseline by the night shift nursing staff early in the morning just before shift change.  I evaluated the patient and confirmed their findings, called a code stroke, sent the patient for stat CT, which confirmed that the patient had an acute to early subacute right MCA distribution infarct.  I discussed the clinical and radiological findings with the patient's wife as well as treatment options.  She requested to continue full care and transfer to [facility] if necessary for work-up/treatment, since during the weekend we do not have neurology consult times or MRI capability.  I discussed the case with PCCM who only had 1 bed reserved for a critical transfer requested earlier.  They agreed to provide [place] with that remaining bed if neurology thought that he needed an acute procedure after CTA head/neck performed.  CTA head and neck showed complete stroke and neurology did not recommend any intervention as he seemed to be outside of the treatment window.  He has also been on full dose SQ Lovenox for atrial fibrillation, which also contraindicated the use of tPA.  Vital signs were 98.4, pulse 82, respirations 20, BP 136/72 mmHg and O2 sat 95% on 15 L/min via HFNC.  General: Looks acutely ill, but in NAD at this time. HEENT: Normocephalic.  PERRLA, Sutton in place.  OM is mildly dry. Neck: Supple, no JVD. Lungs: Mildly tachypneic in the low 20s with scattered bilateral crackles. Cardiovascular: S1-S2, irregularly irregular Abdomen: Soft, nontender. Extremities: LUE 2+ pitting edema. Skin: Some areas of ecchymosis from venipuncture, particularly in upper extremities. Neuro: 1/5 left-sided hemiparesis with brisk DTRs on left side  extremities.  Radiology reports: CLINICAL DATA:  Code stroke. Initial evaluation for acute stroke, left-sided weakness.  EXAM: CT HEAD WITHOUT CONTRAST  TECHNIQUE: Contiguous axial images were obtained from the base of the skull through the vertex without intravenous contrast.  COMPARISON:  None available.  FINDINGS: Brain: Age-related cerebral atrophy with chronic small vessel ischemic disease.  Evolving area of cytotoxic edema seen involving the right insula and overlying right frontal operculum and frontal cortex, consistent with an evolving acute to early subacute right MCA distribution infarct. No associated hemorrhage or significant regional mass effect.  No other acute large vessel territory infarct. No intracranial hemorrhage. No mass lesion, midline shift or mass effect. No hydrocephalus or extra-axial fluid collection.  Vascular: Question hyperdense vessel at the right sylvian fissure, possibly reflecting thrombus in a right M2 branch (series 2, image 23). Scattered vascular calcifications noted within the carotid siphons.  Skull: Scalp soft tissues and calvarium within normal limits.  Sinuses/Orbits: Globes and orbital soft tissues demonstrate no acute finding. Patient status post ocular lens replacement on the right. Air-fluid levels noted within the right maxillary and sphenoid sinuses. Mastoid air cells are clear.  Other: None.  ASPECTS [facility] Stroke Program Early CT Score)  - Ganglionic level infarction (caudate, lentiform nuclei, internal capsule, insula, M1-M3 cortex): 6  - Supraganglionic infarction (M4-M6 cortex): 1  Total score (0-10 with 10 being normal): 7  IMPRESSION: 1. Evolving cytotoxic edema involving the right insula and overlying right frontal lobe, consistent with acute to early subacute right MCA distribution infarct. No hemorrhage. 2. ASPECTS is 7. 3. Question hyperdense vessel  at the base of the right  sylvian fissure, possibly reflecting thrombus within a proximal right M2 branch. 4. Underlying atrophy with chronic small vessel ischemic disease.  Critical Value/emergent results were called by telephone at the time of interpretation on 06/07/2020 at 12:08 am to provider Quiana Cobaugh , who verbally acknowledged these results.   Electronically Signed   By: Jeannine Boga M.D.   On: 06/07/2020 00:10  CLINICAL DATA:  Follow-up examination for acute stroke.  EXAM: CT ANGIOGRAPHY HEAD AND NECK  TECHNIQUE: Multidetector CT imaging of the head and neck was performed using the standard protocol during bolus administration of intravenous contrast. Multiplanar CT image reconstructions and MIPs were obtained to evaluate the vascular anatomy. Carotid stenosis measurements (when applicable) are obtained utilizing NASCET criteria, using the distal internal carotid diameter as the denominator.  CONTRAST:  180mL OMNIPAQUE IOHEXOL 350 MG/ML SOLN  COMPARISON:  Prior head CT from 06/06/2020.  FINDINGS:  CTA NECK FINDINGS:  Aortic arch: Visualized aortic arch of normal caliber with normal 3 vessel morphology. Moderate atheromatous change about the arch and origin of the great vessels without hemodynamically significant stenosis.  Right carotid system: Right common carotid artery patent from its origin to the bifurcation without stenosis. Scattered calcified plaque about the right bifurcation without hemodynamically significant stenosis. Right ICA patent distally to the skull base without stenosis, dissection or occlusion.  Left carotid system: Left common and internal carotid arteries patent without stenosis, dissection or occlusion. No significant atheromatous change or irregularity about the left bifurcation.  Vertebral arteries: Both vertebral arteries arise from subclavian arteries. No proximal subclavian artery stenosis. Both vertebral arteries widely patent  within the neck without stenosis, dissection or occlusion.  Skeleton: No visible acute osseous abnormality. No discrete or worrisome osseous lesions. Moderate cervical spondylosis noted at C4-5 and C5-6.  Other neck: No other acute soft tissue abnormality within the neck. No mass or adenopathy.  Upper chest: Extensive multifocal ground-glass opacity seen throughout the visualized lungs, right worse than left, concerning for severe multifocal pneumonia.  Review of the MIP images confirms the above findings  CTA HEAD FINDINGS  Anterior circulation: Petrous segments patent bilaterally. Mild atheromatous change within the carotid siphons without hemodynamically significant stenosis. A1 segments widely patent. Normal anterior communicating artery complex. Anterior cerebral arteries patent to their distal aspects without stenosis. No M1 stenosis or occlusion. Normal MCA bifurcations. On the right, there is acute occlusion of a proximal right M2 branch, superior division (series 7, image 88). Inferior division and its branches remain patent. Contralateral left MCA branches well perfused and patent.  Posterior circulation: Both V4 segments patent to the vertebrobasilar junction without stenosis. Right PICA patent and normal. Left PICA not seen. Basilar patent to its distal aspect without stenosis. Superior cerebellar arteries patent bilaterally. Both PCAs primarily supplied via the basilar well perfused or distal aspects.  Venous sinuses: Grossly patent allowing for timing the contrast bolus. Hypoplastic left transverse and sigmoid sinuses not well assessed.  Anatomic variants: None significant.  No aneurysm.  Review of the MIP images confirms the above findings  IMPRESSION: 1. Acute right M2 branch occlusion, superior division, in keeping with the previously identified acute to early subacute right MCA distribution infarct. 2. Otherwise wide patency of the major  arterial vasculature of the head and neck. 3. Extensive ground-glass opacity throughout the visualized lungs, right worse than left, concerning for severe multifocal pneumonia.  Electronically Signed   By: Jeannine Boga M.D.   On: 06/07/2020 02:03 ------------------------------------------------------------------------------------------ Assessment: Acute  right MCA distribution CVA. Thrombolytics are contraindicated. He is outside of the window for vascular intervention. Plan: No MRI or neurology capability at this facility on the moment. Will transfer to [facility] for stroke work-up and evaluation by neurology. Rate control medications for atrial fibrillation were discontinued to allow permissive hypertension and a single dose of digoxin 0.5 mg IVP given preemptively to avoid RVR.  His overall prognosis given his age and multiple comorbidities including acute respiratory failure secondary to COVID-19 pneumonia is very poor.  Tennis Must, MD.  Over 60 minutes of critical care time were spent during the process of these emergent event.  This document was prepared using Dragon voice recognition software and may contain some unintended transcription errors.

## 2020-06-06 NOTE — Progress Notes (Signed)
Palliative:  HPI:85 y.o.malewith past medical history of arthritis, skin cancer, right total knee arthroplasty, previous tobacco/cigarette abuseadmitted on 1/16/2022with acute respiratory failure secondary to COVID pneumonia.Now requiringBiPAP 100% FiO2.Down to 50% 06/04/20. Plans for trial off BiPAP 06/05/20 as he is more awake and responsive today. Now on heated high flow.   I met today at Joshua Robinson's bedside. He is alert and follows simple commands but unable to speak. His eyes are bloodshot and breakdown on nose from previous BiPAP use. He is more alert but he appears miserable. I did reassure him that his family loves him and wife is sad she cannot be here with him but it is hospital policy that she cannot be at his bedside at this time. When I discussed his family he became slightly agitated and face turns red and straining as I believe he is wanting to communicate with me but cannot. I provided reassurance and encouraged him to just rest and relax so his body can heal.   I called and spoke with wife, Joshua Robinson. I share the above with Joshua Robinson. Joshua Robinson is pleased that he is making some progress by decreased oxygen needs. She continues to be hopeful that God will heal her husband. She continues to desire full aggressive care and full code. I have attempted to call daughter, Joshua Robinson, but unable to get answer.   Exam: Alert, follows simple commands. Unable to speak. HR 80-90s. Breathing regular on 25L 70% FiO2 heated HFNC with some accessory muscle use but no tachypnea. Abd soft, flat. Skin frail, thin, friable. Appears overall uncomfortable and miserable.   Plan: - Full aggressive care desired.  - I worry how Joshua Robinson will progress. Continues to be high risk for acute decompensation and decline.   25 min  Joshua Sill, NP Palliative Medicine Team Pager [phone] (Please see [email or web address] for schedule) Team Phone [phone]    Greater than 50%  of this time was spent counseling and  coordinating care related to the above assessment and plan

## 2020-06-07 ENCOUNTER — Inpatient Hospital Stay (HOSPITAL_COMMUNITY): Payer: Medicare Other

## 2020-06-07 ENCOUNTER — Encounter (HOSPITAL_COMMUNITY): Payer: Self-pay | Admitting: Family Medicine

## 2020-06-07 DIAGNOSIS — J9601 Acute respiratory failure with hypoxia: Secondary | ICD-10-CM | POA: Diagnosis not present

## 2020-06-07 DIAGNOSIS — J069 Acute upper respiratory infection, unspecified: Secondary | ICD-10-CM | POA: Diagnosis not present

## 2020-06-07 DIAGNOSIS — I4891 Unspecified atrial fibrillation: Secondary | ICD-10-CM | POA: Diagnosis not present

## 2020-06-07 DIAGNOSIS — U071 COVID-19: Secondary | ICD-10-CM | POA: Diagnosis not present

## 2020-06-07 LAB — BASIC METABOLIC PANEL
Anion gap: 9 (ref 5–15)
BUN: 45 mg/dL — ABNORMAL HIGH (ref 8–23)
CO2: 26 mmol/L (ref 22–32)
Calcium: 8.1 mg/dL — ABNORMAL LOW (ref 8.9–10.3)
Chloride: 112 mmol/L — ABNORMAL HIGH (ref 98–111)
Creatinine, Ser: 0.76 mg/dL (ref 0.61–1.24)
GFR, Estimated: >60 mL/min (ref >60)
Glucose, Bld: 133 mg/dL — ABNORMAL HIGH (ref 70–99)
Potassium: 3.8 mmol/L (ref 3.5–5.1)
Sodium: 147 mmol/L — ABNORMAL HIGH (ref 135–145)

## 2020-06-07 LAB — LIPID PANEL
Cholesterol: 224 mg/dL — ABNORMAL HIGH (ref 0–200)
HDL: 45 mg/dL (ref >40)
LDL Cholesterol: 147 mg/dL — ABNORMAL HIGH (ref 0–99)
Total CHOL/HDL Ratio: 5 RATIO
Triglycerides: 161 mg/dL — ABNORMAL HIGH (ref <150)
VLDL: 32 mg/dL (ref 0–40)

## 2020-06-07 LAB — CBC
HCT: 43.1 % (ref 39.0–52.0)
Hemoglobin: 14.4 g/dL (ref 13.0–17.0)
MCH: 33.3 pg (ref 26.0–34.0)
MCHC: 33.4 g/dL (ref 30.0–36.0)
MCV: 99.5 fL (ref 80.0–100.0)
Platelets: 231 10*3/uL (ref 150–400)
RBC: 4.33 MIL/uL (ref 4.22–5.81)
RDW: 13.2 % (ref 11.5–15.5)
WBC: 10.9 10*3/uL — ABNORMAL HIGH (ref 4.0–10.5)
nRBC: 0 % (ref 0.0–0.2)

## 2020-06-07 LAB — HEMOGLOBIN A1C
Hgb A1c MFr Bld: 5.2 % (ref 4.8–5.6)
Mean Plasma Glucose: 102.54 mg/dL

## 2020-06-07 LAB — PROTIME-INR
INR: 1.5 — ABNORMAL HIGH (ref 0.8–1.2)
Prothrombin Time: 17.6 seconds — ABNORMAL HIGH (ref 11.4–15.2)

## 2020-06-07 LAB — GLUCOSE, CAPILLARY
Glucose-Capillary: 101 mg/dL — ABNORMAL HIGH (ref 70–99)
Glucose-Capillary: 108 mg/dL — ABNORMAL HIGH (ref 70–99)
Glucose-Capillary: 110 mg/dL — ABNORMAL HIGH (ref 70–99)
Glucose-Capillary: 115 mg/dL — ABNORMAL HIGH (ref 70–99)
Glucose-Capillary: 120 mg/dL — ABNORMAL HIGH (ref 70–99)
Glucose-Capillary: 128 mg/dL — ABNORMAL HIGH (ref 70–99)
Glucose-Capillary: 138 mg/dL — ABNORMAL HIGH (ref 70–99)

## 2020-06-07 LAB — APTT: aPTT: 49 seconds — ABNORMAL HIGH (ref 24–36)

## 2020-06-07 MED ORDER — DIGOXIN 0.25 MG/ML IJ SOLN
0.5000 mg | Freq: Once | INTRAMUSCULAR | Status: AC
  Administered 2020-06-07: 0.5 mg via INTRAVENOUS
  Filled 2020-06-07: qty 2

## 2020-06-07 MED ORDER — DEXTROSE-NACL 5-0.45 % IV SOLN: INTRAVENOUS | Status: DC

## 2020-06-07 MED ORDER — METOPROLOL TARTRATE 5 MG/5ML IV SOLN
2.5000 mg | Freq: Three times a day (TID) | INTRAVENOUS | Status: DC
  Administered 2020-06-07 – 2020-06-18 (×33): 2.5 mg via INTRAVENOUS
  Filled 2020-06-07 (×34): qty 5

## 2020-06-07 MED ORDER — IOHEXOL 350 MG/ML SOLN
100.0000 mL | Freq: Once | INTRAVENOUS | Status: AC | PRN
  Administered 2020-06-07: 100 mL via INTRAVENOUS

## 2020-06-07 MED ORDER — STROKE: EARLY STAGES OF RECOVERY BOOK: Freq: Once | Status: AC

## 2020-06-07 NOTE — Evaluation (Signed)
Physical Therapy Evaluation Patient Details Name: Joshua Robinson MRN: [id number] DOB: 1934-09-20 Today's Date: 06/07/2020   History of Present Illness  86 year old male with no documented chronic medical problems, but is a reformed smoker of 50 pack years presenting with myalgias, coughing, and shortness of breath that began on 05/24/2020.  The patient states that he visited his PCP on a virtual visit on 05/27/2019.  He was given oral steroids and azithromycin.  His symptoms did not improve.  He continued to have worsening shortness of breath, coughing.  He did have some intermittent chest discomfort with coughing but denied any hemoptysis.  He had some loose stools but did not have any hematochezia or melena.  He also had subjective fevers and chills at home.  He has had 2 doses of the Moderna vaccination but has not been boosted.  EMS was activated and the patient was noted to have oxygen saturation 67% on room air.  He was placed on CPAP and brought to the hospital.  In the emergency department, the patient was subsequently placed on high flow nasal cannula.  He did not tolerate it well and was placed on BiPAP.  He has been subsequently transitioned to heated high flow at 40 L.  The patient was given Actemra and started on intravenous steroids.  In addition, the patient was noted to have atrial fibrillation with RVR.  He was started on a diltiazem drip. New onset of LUE/LLE hemiplegia due to recent right MCA sroke   Clinical Impression   Patient exhibits significant global deficits in his mobility and new onset of LUE/LLE hemiplegia from recent hx of MCA stroke and demonstrates decreased level of alertness/arousal.  Patient exhibits severe deficits requiring total assistance for bed mobility/repositioning and would require use of mechanical lift to mobilize OOB at this time.  Poor motor control/coordination and inability to maintain sitting position requiring total assistance/support to sit EOB without  ability to maintain static posture.  Patient able to follow one-step commands at time of evaluation with 25% accuracy but unable to maintain attention to task due to lethargy.  Patient would benefit from continued skilled services whilst hospitalized to progress mobility and provide maneuvering techniques to mobilize and hopefully improve level of alertness and facilitate proprioception/body awareness.    Follow Up Recommendations SNF;LTACH    Equipment Recommendations   (TBD based on functional outcomes)    Recommendations for Other Services       Precautions / Restrictions        Mobility  Bed Mobility Overal bed mobility: Needs Assistance Bed Mobility: Rolling;Supine to Sit;Sit to Supine Rolling: Total assist   Supine to sit: Total assist Sit to supine: Total assist        Transfers Overall transfer level: Needs assistance               General transfer comment: unable to maintain sitting  Ambulation/Gait Ambulation/Gait assistance:  (not attempted due to current level of function/condition)              Stairs            Wheelchair Mobility    Modified Rankin (Stroke Patients Only)       Balance Overall balance assessment: Needs assistance   Sitting balance-Leahy Scale: Zero   Postural control: Posterior lean;Left lateral lean  Pertinent Vitals/Pain Pain Assessment:  (no indication of pain during assessment)    Home Living Family/patient expects to be discharged to:: Private residence Living Arrangements: Spouse/significant other Available Help at Discharge: Family Type of Home: House Home Access: Stairs to enter   Technical brewer of Steps: 1 Home Layout: One level Home Equipment: Walker - standard;Cane - single point      Prior Function Level of Independence: Independent               Hand Dominance        Extremity/Trunk Assessment   Upper Extremity  Assessment Upper Extremity Assessment: LUE deficits/detail LUE Deficits / Details: flaccid hemiparesis/paralysis LUE Coordination: decreased gross motor;decreased fine motor    Lower Extremity Assessment Lower Extremity Assessment: LLE deficits/detail LLE Deficits / Details: LLE flaccid hemiparalysis LLE Coordination: decreased gross motor       Communication   Communication: No difficulties  Cognition Arousal/Alertness: Lethargic   Overall Cognitive Status: Impaired/Different from baseline Area of Impairment: Orientation                 Orientation Level: Disoriented to                    General Comments      Exercises General Exercises - Lower Extremity Ankle Circles/Pumps: AAROM;Right;PROM;Left;Supine;20 reps Heel Slides: AAROM;Right;PROM;Left;20 reps;Supine Hip ABduction/ADduction: PROM;Both;20 reps Straight Leg Raises: PROM;Both;20 reps   Assessment/Plan    PT Assessment Patient needs continued PT services  PT Problem List Decreased strength;Decreased activity tolerance;Decreased balance;Decreased knowledge of use of DME;Decreased cognition;Decreased coordination;Decreased mobility;Decreased safety awareness;Decreased knowledge of precautions;Cardiopulmonary status limiting activity;Impaired sensation;Impaired tone       PT Treatment Interventions DME instruction;Gait training;Stair training;Functional mobility training;Therapeutic activities;Patient/family education;Neuromuscular re-education;Balance training;Therapeutic exercise;Wheelchair mobility training;Manual techniques    PT Goals (Current goals can be found in the Care Plan section)  Acute Rehab PT Goals PT Goal Formulation: Patient unable to participate in goal setting Time For Goal Achievement: 06/28/20 Potential to Achieve Goals: Fair    Frequency Min 3X/week   Barriers to discharge Other (comment) level of severity of deficits/limitations    Co-evaluation                AM-PAC PT "6 Clicks" Mobility  Outcome Measure Help needed turning from your back to your side while in a flat bed without using bedrails?: Total Help needed moving from lying on your back to sitting on the side of a flat bed without using bedrails?: Total Help needed moving to and from a bed to a chair (including a wheelchair)?: Total Help needed standing up from a chair using your arms (e.g., wheelchair or bedside chair)?: Total Help needed to walk in hospital room?: Total Help needed climbing 3-5 steps with a railing? : Total 6 Click Score: 6    End of Session Equipment Utilized During Treatment: Gait belt Activity Tolerance: Patient limited by lethargy Patient left: in bed;with bed alarm set;with call bell/phone within reach Nurse Communication: Mobility status;Need for lift equipment PT Visit Diagnosis: Muscle weakness (generalized) (M62.81);Hemiplegia and hemiparesis;Other abnormalities of gait and mobility (R26.89) Hemiplegia - Right/Left: Left Hemiplegia - caused by: Cerebral infarction    Time: 1220-1245 PT Time Calculation (min) (ACUTE ONLY): 25 min   Charges:   PT Evaluation $PT Eval Moderate Complexity: 1 Mod PT Treatments $Therapeutic Exercise: 8-22 mins       1:10 PM, 06/07/20 M. Sherlyn Lees, PT, DPT Physical Therapist- [place] Office Number: [phone]

## 2020-06-07 NOTE — Progress Notes (Addendum)
Removed patient's restraints as he was more alert and was able to tell me his name and that he was at the hospital. Upon removal of restraints, noticed that patient was moving his right arm and leg normally and with good strength. Patient was not moving the left side of his body at all. Patient had no resistance to gravity on left side, was unable to follow commands, and had no sensation on left side. Patient following commands otherwise and able to hold right arm against gravity, squeeze fingers and responded to stimuli on right side. MD made aware of change. Orders placed and followed. Restraints no longer needed, not placed back on patient.

## 2020-06-07 NOTE — Progress Notes (Signed)
PROGRESS NOTE  Joshua Robinson [id number] DOB: 28-Apr-1935 DOA: 06/01/2020 PCP: Redmond School, MD  Brief History:  86 year old male with no documented chronic medical problems, but is a reformed smoker of 50 pack years presenting with myalgias, coughing, and shortness of breath that began on 05/24/2020.  The patient states that he visited his PCP on a virtual visit on 05/27/2019.  He was given oral steroids and azithromycin.  His symptoms did not improve.  He continued to have worsening shortness of breath, coughing.  He did have some intermittent chest discomfort with coughing but denied any hemoptysis.  He had some loose stools but did not have any hematochezia or melena.  He also had subjective fevers and chills at home.  He has had 2 doses of the Moderna vaccination but has not been boosted.  EMS was activated and the patient was noted to have oxygen saturation 67% on room air.  He was placed on CPAP and brought to the hospital.  In the emergency department, the patient was subsequently placed on high flow nasal cannula.  He did not tolerate it well and was placed on BiPAP.  He has been subsequently transitioned to heated high flow at 40 L.  The patient was given Actemra and started on intravenous steroids. In addition, the patient was noted to have atrial fibrillation with RVR.  He was started on a diltiazem drip.  Assessment/Plan: Acute respiratory failure with hypoxia secondary to COVID-19 - still requiring BiPAP at 50% FiO2 with saturations 90-92% -Continue intravenous Solu-Medrol and as needed bronchodilator. -received Actemra on 06/01/20 -Continue to follow inflammatory markers. -Personally reviewed chest x-ray--diffuse bilateral opacities, requiring left -06/02/2020 CTA chest--negative PE, diffuse groundglass opacities and consolidative opacities -Continue Vitamin C and zinc when able to take PO's -Will work as an outpatient able to cooperate with proning position, as well  as using incentive spirometer and flutter valve. -Continue holding antibiotics at this time; procalcitonin level not elevated. -Patient is afebrile. -Follow blood cultures--neg growth to date -Will continue avoiding as much as possible the use of sedative agents -Despite the risk of potential aspiration while on BiPAP in case he ended vomiting, pursued the use of soft restraints.  New embolic acute right MCA stroke -Patient with left-sided hemiparesis, dysarthria and left facial droop -We will allow for permissive hypertension -Continue low-dose metoprolol for rate control -Continue full dose Lovenox. -Speech therapy suggesting high risk for aspiration and asking for alternative route for medications and nutrition at this point -Anticipating need a skilled nursing facility at discharge for rehabilitation if patient further improved and stabilizes for that.  -Palliative care is following. -MRI and neurology assessment/recommendations after patient transferred to [facility].  Atrial fibrillation with RVR -Continue diltiazem drip for now -Transition to oral diltiazem once respiratory status has stabilized and patient can reliably take p.o. -IV metoprolol added to assist  with transition.  -Echo--EF 50-55%, no WMA, RVSP 50 -TSH--0.124 -CHADS-VASc= 3 (age, ASVD) -Continue Lovenox; when able to tolerate p.o. might be a good candidate for Eliquis. -Patient reviewed EKG--atrial fibrillation, nonspecific ST-T wave change  Hyponatremia -Secondary to volume depletion and poor solute intake -Continue judicious IV fluids and follow electrolytes trend.  Elevated troponin -Secondary to demand ischemia due to acute medical illness as well as atrial fibrillation. -No chest pain presently -Echocardiogram--EF 50-55%, no WMA, moderately elevated PASP (50), mod TR  Hyperglycemia -Likely steroid-induced -A1c 5.2 -NovoLog sliding scale  Presumptive COPD -Patient has had 50-pack-year history  of  tobacco -Quit tobacco 20 years ago -Continue Combivent -Continue steroids as mentioned above.  [place] -Advance care planning, including the explanation and discussion of advance directives was carried out with the patient and family.  Code status including explanations of "Full Code" and "DNR" and alternatives were discussed in detail.  Discussion of end-of-life issues including but not limited palliative care, hospice care and the concept of hospice, other end-of-life care options, power of attorney for health care decisions, living wills, and physician orders for life-sustaining treatment were also discussed with the patient and family.  Total face to face time 16 minutes. -spouse wants full scope of care -palliative medicine consult; will continue to follow [place] discussion outcome.  -Overall prognosis is poor.   Status is: Inpatient  Remains inpatient appropriate because:IV treatments appropriate due to intensity of illness or inability to take PO   Dispo: The patient is from: Home  Anticipated d/c is to: Home  Anticipated d/c date is: 3-4 days  Patient currently is not medically stable to d/c.  At this moment planning to transfer to [facility] for MRI at completion of a stroke work-up along with neurology examination.  Overall prognosis is poor.    Family Communication:   Patient's plan of care discussed over the phone with wife 06/06/2020.  Consultants:   Palliative care service, neurology   Code Status:  FULL-confirmed with patient  DVT Prophylaxis:  lovenox treatment dose   Procedures: As Listed in Progress Note Above  Antibiotics: Antibiotics has been discontinued in the setting of low procalcitonin, no fever and stable WBCs.   Subjective: Good saturation on 10 L high flow nasal cannula supplementation; no chest pain, no nausea, no vomiting.  Overnight with new acute neurologic deficits causing left-sided  hemiparesis, left facial droop and dysarthria.  CT scan demonstrating acute right MCA.   Objective: Vitals:   06/07/20 0500 06/07/20 0600 06/07/20 0700 06/07/20 0800  BP: 128/72  134/78   Pulse: (!) 113 (!) 114 73 (!) 105  Resp: 20 (!) 28 (!) 21   Temp: 97.88 F (36.6 C) 98.2 F (36.8 C) 98.4 F (36.9 C) 98.4 F (36.9 C)  TempSrc:   Bladder   SpO2: 93% 93% 97% 96%  Weight:      Height:        Intake/Output Summary (Last 24 hours) at 06/07/2020 1407 Last data filed at 06/07/2020 1000 Gross per 24 hour  Intake 10 ml  Output 1900 ml  Net -1890 ml   Weight change:   Exam: General exam: Alert, awake, oriented x 1, demonstrating left facial droop, dysarthria and left hemiparesis.  Off BiPAP, afebrile and with good saturation on 10 L high flow nasal cannula supplementation.  Chronically ill and frail on examination. Respiratory system: Mild tachypnea intermittently; positive rhonchi bilaterally.  No wheezing, no crackles, no using accessory muscles. Cardiovascular system: Irregular, no rubs, no gallops, no JVD on exam. Gastrointestinal system: Abdomen is nondistended, soft and nontender. No organomegaly or masses felt. Normal bowel sounds heard. Central nervous system: New left-sided hemiparesis, left facial droop compulsory dysarthria. Extremities: No C cyanosis or clubbing. Skin: No petechiae. Psychiatry: Unable to properly assess judgment and insight in the setting of new neurologic deficits.  No agitation.  Stable mood.  Data Reviewed: I have personally reviewed following labs and imaging studies  Basic Metabolic Panel: Recent Labs  Lab 06/02/20 0430 06/03/20 0521 06/04/20 0528 06/05/20 0336 06/06/20 0432 06/06/20 2358  NA 136 140 143 146* 148* 147*  K 3.8 3.9 3.8 3.6 3.6 3.8  CL 102 105 109 110 111 112*  CO2 23 24 25 25 25 26   GLUCOSE 137* 148* 151* 145* 131* 133*  BUN 23 35* 42* 45* 44* 45*  CREATININE 0.82 0.80 0.85 0.84 0.75 0.76  CALCIUM 8.3* 8.2* 8.0* 8.3*  8.1* 8.1*  MG 2.3 2.7* 2.7* 2.6* 2.6*  --   PHOS 3.1 4.1 3.0 3.6 3.8  --    Liver Function Tests: Recent Labs  Lab 06/02/20 0430 06/03/20 0521 06/04/20 0528 06/05/20 0336 06/06/20 0432  AST 29 25 23 26 31   ALT 36 37 30 32 43  ALKPHOS 106 112 110 108 94  BILITOT 0.6 0.6 0.5 1.1 1.4*  PROT 6.6 6.3* 5.6* 6.2* 6.1*  ALBUMIN 2.6* 2.6* 2.4* 2.8* 2.9*   Coagulation Profile: Recent Labs  Lab 06/01/20 1215 06/06/20 2358  INR 1.2 1.5*   CBC: Recent Labs  Lab 06/02/20 0430 06/03/20 0521 06/04/20 0528 06/05/20 0336 06/06/20 0432 06/07/20 0442  WBC 15.3* 17.2* 11.1* 12.5* 12.2* 10.9*  NEUTROABS 13.9* 15.7* 10.1* 11.4* 11.1*  --   HGB 13.1 13.1 12.6* 13.2 13.3 14.4  HCT 38.3* 39.8 36.8* 38.5* 39.8 43.1  MCV 97.0 100.5* 98.7 98.7 99.0 99.5  PLT 285 296 179 190 204 231   CBG: Recent Labs  Lab 06/06/20 2105 06/07/20 0024 06/07/20 0514 06/07/20 0809 06/07/20 1157  GLUCAP 121* 138* 128* 120* 108*   HbA1C: No results for input(s): HGBA1C in the last 72 hours. Urine analysis:    Component Value Date/Time   COLORURINE YELLOW 06/01/2020 1215   APPEARANCEUR HAZY (A) 06/01/2020 1215   LABSPEC 1.026 06/01/2020 1215   PHURINE 5.0 06/01/2020 1215   GLUCOSEU NEGATIVE 06/01/2020 1215   HGBUR NEGATIVE 06/01/2020 1215   BILIRUBINUR NEGATIVE 06/01/2020 1215   KETONESUR 5 (A) 06/01/2020 1215   PROTEINUR 100 (A) 06/01/2020 1215   UROBILINOGEN 0.2 02/12/2013 1150   NITRITE NEGATIVE 06/01/2020 1215   LEUKOCYTESUR NEGATIVE 06/01/2020 1215   Sepsis Labs:  Recent Results (from the past 240 hour(s))  Blood culture (routine single)     Status: None   Collection Time: 06/01/20 12:15 PM   Specimen: Right Antecubital; Blood  Result Value Ref Range Status   Specimen Description   Final    RIGHT ANTECUBITAL BOTTLES DRAWN AEROBIC AND ANAEROBIC   Special Requests Blood Culture adequate volume  Final   Culture   Final    NO GROWTH 5 DAYS Performed at [facility], [address],  [place], [place] [address]    Report Status 06/06/2020 FINAL  Final  Urine culture     Status: None   Collection Time: 06/01/20 12:15 PM   Specimen: In/Out Cath Urine  Result Value Ref Range Status   Specimen Description   Final    IN/OUT CATH URINE Performed at [facility], [address], [place], [place] [address]    Special Requests   Final    NONE Performed at [facility], [address], [place], [place] [address]    Culture   Final    NO GROWTH Performed at [facility], [place] [address], [place], [place] [address]    Report Status 06/03/2020 FINAL  Final  Blood Culture (routine x 2)     Status: None   Collection Time: 06/01/20 12:30 PM   Specimen: BLOOD RIGHT FOREARM  Result Value Ref Range Status   Specimen Description   Final    BLOOD RIGHT FOREARM BOTTLES DRAWN  AEROBIC AND ANAEROBIC   Special Requests Blood Culture adequate volume  Final   Culture   Final    NO GROWTH 5 DAYS Performed at [facility], [address], [place], [place] [address]    Report Status 06/06/2020 FINAL  Final  MRSA PCR Screening     Status: None   Collection Time: 06/01/20 12:40 PM   Specimen: Nasopharyngeal  Result Value Ref Range Status   MRSA by PCR NEGATIVE NEGATIVE Final    Comment:        The GeneXpert MRSA Assay (FDA approved for NASAL specimens only), is one component of a comprehensive MRSA colonization surveillance program. It is not intended to diagnose MRSA infection nor to guide or monitor treatment for MRSA infections. Performed at [facility], [address], [place], [place] [address]   MRSA PCR Screening     Status: None   Collection Time: 06/02/20  2:01 PM   Specimen: Nasopharyngeal  Result Value Ref Range Status   MRSA by PCR NEGATIVE NEGATIVE Final    Comment:        The GeneXpert MRSA Assay (FDA approved for NASAL specimens only), is one component of a comprehensive MRSA colonization surveillance program. It is not intended to diagnose MRSA infection  nor to guide or monitor treatment for MRSA infections. Performed at [facility], [address] [place],  [address]      Scheduled Meds: . vitamin C  500 mg Oral Daily  . Chlorhexidine Gluconate Cloth  6 each Topical Daily  . enoxaparin (LOVENOX) injection  80 mg Subcutaneous Q12H  . folic acid  1 mg Intravenous Daily  . insulin aspart  0-9 Units Subcutaneous Q4H  . mouth rinse  15 mL Mouth Rinse BID  . methylPREDNISolone (SOLU-MEDROL) injection  80 mg Intravenous Q12H  . metoprolol tartrate  2.5 mg Intravenous Q8H  . pantoprazole (PROTONIX) IV  40 mg Intravenous QHS  . sodium chloride flush  3 mL Intravenous Q12H  . sodium chloride flush  3 mL Intravenous Q12H  . thiamine injection  100 mg Intravenous Daily  . zinc sulfate  220 mg Oral Daily   Continuous Infusions: . sodium chloride    . dextrose 5 % and 0.45% NaCl    . sodium chloride      Procedures/Studies: CT ANGIO NECK W OR WO CONTRAST  Result Date: 06/07/2020 CLINICAL DATA:  Follow-up examination for acute stroke. EXAM: CT ANGIOGRAPHY HEAD AND NECK TECHNIQUE: Multidetector CT imaging of the head and neck was performed using the standard protocol during bolus administration of intravenous contrast. Multiplanar CT image reconstructions and MIPs were obtained to evaluate the vascular anatomy. Carotid stenosis measurements (when applicable) are obtained utilizing NASCET criteria, using the distal internal carotid diameter as the denominator. CONTRAST:  189mL OMNIPAQUE IOHEXOL 350 MG/ML SOLN COMPARISON:  Prior head CT from 06/06/2020. FINDINGS: Delete that CTA NECK FINDINGS Aortic arch: Visualized aortic arch of normal caliber with normal 3 vessel morphology. Moderate atheromatous change about the arch and origin of the great vessels without hemodynamically significant stenosis. Right carotid system: Right common carotid artery patent from its origin to the bifurcation without stenosis. Scattered calcified plaque about the  right bifurcation without hemodynamically significant stenosis. Right ICA patent distally to the skull base without stenosis, dissection or occlusion. Left carotid system: Left common and internal carotid arteries patent without stenosis, dissection or occlusion. No significant atheromatous change or irregularity about the left bifurcation. Vertebral arteries: Both vertebral arteries arise from subclavian arteries. No  proximal subclavian artery stenosis. Both vertebral arteries widely patent within the neck without stenosis, dissection or occlusion. Skeleton: No visible acute osseous abnormality. No discrete or worrisome osseous lesions. Moderate cervical spondylosis noted at C4-5 and C5-6. Other neck: No other acute soft tissue abnormality within the neck. No mass or adenopathy. Upper chest: Extensive multifocal ground-glass opacity seen throughout the visualized lungs, right worse than left, concerning for severe multifocal pneumonia. Review of the MIP images confirms the above findings CTA HEAD FINDINGS Anterior circulation: Petrous segments patent bilaterally. Mild atheromatous change within the carotid siphons without hemodynamically significant stenosis. A1 segments widely patent. Normal anterior communicating artery complex. Anterior cerebral arteries patent to their distal aspects without stenosis. No M1 stenosis or occlusion. Normal MCA bifurcations. On the right, there is acute occlusion of a proximal right M2 branch, superior division (series 7, image 88). Inferior division and its branches remain patent. Contralateral left MCA branches well perfused and patent. Posterior circulation: Both V4 segments patent to the vertebrobasilar junction without stenosis. Right PICA patent and normal. Left PICA not seen. Basilar patent to its distal aspect without stenosis. Superior cerebellar arteries patent bilaterally. Both PCAs primarily supplied via the basilar well perfused or distal aspects. Venous sinuses:  Grossly patent allowing for timing the contrast bolus. Hypoplastic left transverse and sigmoid sinuses not well assessed. Anatomic variants: None significant.  No aneurysm. Review of the MIP images confirms the above findings IMPRESSION: 1. Acute right M2 branch occlusion, superior division, in keeping with the previously identified acute to early subacute right MCA distribution infarct. 2. Otherwise wide patency of the major arterial vasculature of the head and neck. 3. Extensive ground-glass opacity throughout the visualized lungs, right worse than left, concerning for severe multifocal pneumonia. Electronically Signed   By: Jeannine Boga M.D.   On: 06/07/2020 02:03   CT Angio Chest PE W and/or Wo Contrast  Result Date: 06/01/2020 CLINICAL DATA:  cough, COVID+, elevated d dimer EXAM: CT ANGIOGRAPHY CHEST WITH CONTRAST TECHNIQUE: Multidetector CT imaging of the chest was performed using the standard protocol during bolus administration of intravenous contrast. Multiplanar CT image reconstructions and MIPs were obtained to evaluate the vascular anatomy. CONTRAST:  50mL OMNIPAQUE IOHEXOL 350 MG/ML SOLN COMPARISON:  Xr chest 06/01/20, xr chest 02/12/13 FINDINGS: Cardiovascular: Satisfactory opacification of the pulmonary arteries to the segmental level. No evidence of pulmonary embolism. Enlarged right atria. Otherwise heart size. No significant pericardial effusion. The thoracic aorta is normal in caliber. Mild atherosclerotic plaque of the thoracic aorta. Mild 2 vessel coronary artery calcifications. Mediastinum/Nodes: No enlarged mediastinal, hilar, or axillary lymph nodes. Thyroid gland, trachea, and esophagus demonstrate no significant findings. Lungs/Pleura: Diffuse ground glass and consolidative opacities involving the majority of the lungs. Limited evaluation for pulmonary nodule due to minimal aerated lung. No definite pulmonary mass. No pleural effusion. No pneumothorax. Upper Abdomen: No acute  abnormality. Musculoskeletal: No chest wall abnormality No suspicious lytic or blastic osseous lesions. No acute displaced fracture. Multilevel degenerative changes of the spine. Review of the MIP images confirms the above findings. IMPRESSION: 1. No pulmonary embolus. 2. Diffuse multifocal pneumonia in the setting of COVID-19 infection. 3.  Aortic Atherosclerosis (ICD10-I70.0). Electronically Signed   By: Iven Finn M.D.   On: 06/01/2020 15:26   US Carotid Bilateral (at [facility] and [facility] only)  Result Date: 06/07/2020 CLINICAL DATA:  Altered mental status, left-sided weakness, right MCA territory infarct by CT EXAM: BILATERAL CAROTID DUPLEX ULTRASOUND TECHNIQUE: Pearline Cables scale imaging, color Doppler and duplex ultrasound were performed  of bilateral carotid and vertebral arteries in the neck. COMPARISON:  06/06/2020 FINDINGS: Criteria: Quantification of carotid stenosis is based on velocity parameters that correlate the residual internal carotid diameter with NASCET-based stenosis levels, using the diameter of the distal internal carotid lumen as the denominator for stenosis measurement. The following velocity measurements were obtained: RIGHT ICA: 72/11 cm/sec CCA: 52/7 cm/sec SYSTOLIC ICA/CCA RATIO:  0.9 ECA: 102 cm/sec LEFT ICA: 60/14 cm/sec CCA: 78/24 cm/sec SYSTOLIC ICA/CCA RATIO:  0.8 ECA: 76 cm/sec RIGHT CAROTID ARTERY: Minor echogenic shadowing plaque formation. No hemodynamically significant right ICA stenosis, velocity elevation, or turbulent flow. Degree of narrowing less than 50%. RIGHT VERTEBRAL ARTERY:  Normal antegrade flow LEFT CAROTID ARTERY: Similar scattered minor plaque formation. No hemodynamically significant left ICA stenosis, velocity elevation, or turbulent flow. LEFT VERTEBRAL ARTERY:  Normal antegrade flow IMPRESSION: Minor carotid atherosclerosis. No hemodynamically significant ICA stenosis. Degree of narrowing less than 50% bilaterally by ultrasound criteria. Patent antegrade vertebral  flow bilaterally Electronically Signed   By: Jerilynn Mages.  Shick M.D.   On: 06/07/2020 12:05   DG Chest Port 1 View  Result Date: 06/01/2020 CLINICAL DATA:  PT on Bipap unable to give hx. Positive Covid test. Hx of CA. ER notes:Pt brought in by [facility] from home with c/o SOB and productive green sputum x 7 days. EMS arrived and pt was found to be 67% on RA. Pt was placed on CPAP and sats increased to 85%. Pt was also given 4 tablets of Nitro by EMS. EMS reports crackles in all lung fields. HR 150, resp 50 per EMS EXAM: PORTABLE CHEST 1 VIEW COMPARISON:  02/12/2013 FINDINGS: Bilateral hazy airspace lung opacities are noted consistent multifocal pneumonia. No evidence of pulmonary edema. No pleural effusion or pneumothorax. Cardiac silhouette is normal in size. No mediastinal or hilar masses. Skeletal structures are grossly intact. IMPRESSION: Bilateral hazy airspace lung opacities consistent with multifocal pneumonia, pattern consistent with atypical/viral infection including COVID 19. Electronically Signed   By: Lajean Manes M.D.   On: 06/01/2020 12:37   ECHOCARDIOGRAM COMPLETE  Result Date: 06/02/2020    ECHOCARDIOGRAM REPORT   Patient Name:   KERI TAVELLA Date of Exam: 06/02/2020 Medical Rec #:  [id number]         Height:       68.0 in Accession #:    [id number]        Weight:       165.0 lb Date of Birth:  [birth year]         BSA:          1.883 m Patient Age:    31 years          BP:           107/75 mmHg Patient Gender: M                 HR:           101 bpm. Exam Location:  [facility] Procedure: 2D Echo, Cardiac Doppler and Color Doppler Indications:    Dyspnea R06.00  History:        Patient has no prior history of Echocardiogram examinations.                 Arrythmias:Atrial Fibrillation. Pneumonia due to COVID-19 virus.  Sonographer:    Alvino Chapel RCS Referring Phys: [id number] COURAGE EMOKPAE IMPRESSIONS  1. Left ventricular ejection fraction, by estimation, is 50 to 55%. The left ventricle has low normal  function. The left ventricle  has no regional wall motion abnormalities. Left ventricular diastolic parameters are indeterminate.  2. Right ventricular systolic function is normal. The right ventricular size is normal. There is moderately elevated pulmonary artery systolic pressure. The estimated right ventricular systolic pressure is 34.7 mmHg.  3. Left atrial size was mildly dilated.  4. Right atrial size was moderately dilated.  5. There is a trivial pericardial effusion posterior to the left ventricle.  6. The mitral valve is grossly normal. Mild mitral valve regurgitation.  7. Tricuspid valve regurgitation is moderate.  8. The aortic valve is tricuspid. There is mild calcification of the aortic valve. Aortic valve regurgitation is not visualized.  9. The inferior vena cava is dilated in size with <50% respiratory variability, suggesting right atrial pressure of 15 mmHg. FINDINGS  Left Ventricle: Left ventricular ejection fraction, by estimation, is 50 to 55%. The left ventricle has low normal function. The left ventricle has no regional wall motion abnormalities. The left ventricular internal cavity size was normal in size. There is no left ventricular hypertrophy. Left ventricular diastolic parameters are indeterminate. Right Ventricle: The right ventricular size is normal. No increase in right ventricular wall thickness. Right ventricular systolic function is normal. There is moderately elevated pulmonary artery systolic pressure. The tricuspid regurgitant velocity is 2.96 m/s, and with an assumed right atrial pressure of 15 mmHg, the estimated right ventricular systolic pressure is 42.5 mmHg. Left Atrium: Left atrial size was mildly dilated. Right Atrium: Right atrial size was moderately dilated. Pericardium: Trivial pericardial effusion is present. The pericardial effusion is posterior to the left ventricle. Mitral Valve: The mitral valve is grossly normal. Mild to moderate mitral annular calcification. Mild  mitral valve regurgitation. Tricuspid Valve: The tricuspid valve is grossly normal. Tricuspid valve regurgitation is moderate. Aortic Valve: The aortic valve is tricuspid. There is mild calcification of the aortic valve. There is mild to moderate aortic valve annular calcification. Aortic valve regurgitation is not visualized. Pulmonic Valve: The pulmonic valve was grossly normal. Pulmonic valve regurgitation is trivial. Aorta: The aortic root is normal in size and structure. Venous: The inferior vena cava is dilated in size with less than 50% respiratory variability, suggesting right atrial pressure of 15 mmHg. IAS/Shunts: No atrial level shunt detected by color flow Doppler.  LEFT VENTRICLE PLAX 2D LVIDd:         4.50 cm LVIDs:         3.00 cm LV PW:         0.90 cm LV IVS:        1.00 cm LVOT diam:     2.10 cm LV SV:         42 LV SV Index:   22 LVOT Area:     3.46 cm  RIGHT VENTRICLE TAPSE (M-mode): 1.4 cm LEFT ATRIUM             Index       RIGHT ATRIUM           Index LA diam:        3.80 cm 2.02 cm/m  RA Area:     25.60 cm LA Vol (A2C):   59.9 ml 31.81 ml/m RA Volume:   86.30 ml  45.82 ml/m LA Vol (A4C):   66.5 ml 35.31 ml/m LA Biplane Vol: 67.7 ml 35.95 ml/m  AORTIC VALVE LVOT Vmax:   64.30 cm/s LVOT Vmean:  39.700 cm/s LVOT VTI:    0.120 m  AORTA Ao Root diam: 3.60 cm MITRAL VALVE  TRICUSPID VALVE MV Area (PHT): 4.17 cm     TR Peak grad:   35.0 mmHg MV Decel Time: 182 msec     TR Vmax:        296.00 cm/s MV E velocity: 139.00 cm/s                             SHUNTS                             Systemic VTI:  0.12 m                             Systemic Diam: 2.10 cm Rozann Lesches MD Electronically signed by Rozann Lesches MD Signature Date/Time: 06/02/2020/3:26:04 PM    Final    CT HEAD CODE STROKE WO CONTRAST  Result Date: 06/07/2020 CLINICAL DATA:  Code stroke. Initial evaluation for acute stroke, left-sided weakness. EXAM: CT HEAD WITHOUT CONTRAST TECHNIQUE: Contiguous axial  images were obtained from the base of the skull through the vertex without intravenous contrast. COMPARISON:  None available. FINDINGS: Brain: Age-related cerebral atrophy with chronic small vessel ischemic disease. Evolving area of cytotoxic edema seen involving the right insula and overlying right frontal operculum and frontal cortex, consistent with an evolving acute to early subacute right MCA distribution infarct. No associated hemorrhage or significant regional mass effect. No other acute large vessel territory infarct. No intracranial hemorrhage. No mass lesion, midline shift or mass effect. No hydrocephalus or extra-axial fluid collection. Vascular: Question hyperdense vessel at the right sylvian fissure, possibly reflecting thrombus in a right M2 branch (series 2, image 23). Scattered vascular calcifications noted within the carotid siphons. Skull: Scalp soft tissues and calvarium within normal limits. Sinuses/Orbits: Globes and orbital soft tissues demonstrate no acute finding. Patient status post ocular lens replacement on the right. Air-fluid levels noted within the right maxillary and sphenoid sinuses. Mastoid air cells are clear. Other: None. ASPECTS [facility] Stroke Program Early CT Score) - Ganglionic level infarction (caudate, lentiform nuclei, internal capsule, insula, M1-M3 cortex): 6 - Supraganglionic infarction (M4-M6 cortex): 1 Total score (0-10 with 10 being normal): 7 IMPRESSION: 1. Evolving cytotoxic edema involving the right insula and overlying right frontal lobe, consistent with acute to early subacute right MCA distribution infarct. No hemorrhage. 2. ASPECTS is 7. 3. Question hyperdense vessel at the base of the right sylvian fissure, possibly reflecting thrombus within a proximal right M2 branch. 4. Underlying atrophy with chronic small vessel ischemic disease. Critical Value/emergent results were called by telephone at the time of interpretation on 06/07/2020 at 12:08 am to provider  DAVID ORTIZ , who verbally acknowledged these results. Electronically Signed   By: Jeannine Boga M.D.   On: 06/07/2020 00:10   CT ANGIO HEAD CODE STROKE  Result Date: 06/07/2020 CLINICAL DATA:  Follow-up examination for acute stroke. EXAM: CT ANGIOGRAPHY HEAD AND NECK TECHNIQUE: Multidetector CT imaging of the head and neck was performed using the standard protocol during bolus administration of intravenous contrast. Multiplanar CT image reconstructions and MIPs were obtained to evaluate the vascular anatomy. Carotid stenosis measurements (when applicable) are obtained utilizing NASCET criteria, using the distal internal carotid diameter as the denominator. CONTRAST:  170mL OMNIPAQUE IOHEXOL 350 MG/ML SOLN COMPARISON:  Prior head CT from 06/06/2020. FINDINGS: Delete that CTA NECK FINDINGS Aortic arch: Visualized aortic arch of normal caliber with normal 3  vessel morphology. Moderate atheromatous change about the arch and origin of the great vessels without hemodynamically significant stenosis. Right carotid system: Right common carotid artery patent from its origin to the bifurcation without stenosis. Scattered calcified plaque about the right bifurcation without hemodynamically significant stenosis. Right ICA patent distally to the skull base without stenosis, dissection or occlusion. Left carotid system: Left common and internal carotid arteries patent without stenosis, dissection or occlusion. No significant atheromatous change or irregularity about the left bifurcation. Vertebral arteries: Both vertebral arteries arise from subclavian arteries. No proximal subclavian artery stenosis. Both vertebral arteries widely patent within the neck without stenosis, dissection or occlusion. Skeleton: No visible acute osseous abnormality. No discrete or worrisome osseous lesions. Moderate cervical spondylosis noted at C4-5 and C5-6. Other neck: No other acute soft tissue abnormality within the neck. No mass or  adenopathy. Upper chest: Extensive multifocal ground-glass opacity seen throughout the visualized lungs, right worse than left, concerning for severe multifocal pneumonia. Review of the MIP images confirms the above findings CTA HEAD FINDINGS Anterior circulation: Petrous segments patent bilaterally. Mild atheromatous change within the carotid siphons without hemodynamically significant stenosis. A1 segments widely patent. Normal anterior communicating artery complex. Anterior cerebral arteries patent to their distal aspects without stenosis. No M1 stenosis or occlusion. Normal MCA bifurcations. On the right, there is acute occlusion of a proximal right M2 branch, superior division (series 7, image 88). Inferior division and its branches remain patent. Contralateral left MCA branches well perfused and patent. Posterior circulation: Both V4 segments patent to the vertebrobasilar junction without stenosis. Right PICA patent and normal. Left PICA not seen. Basilar patent to its distal aspect without stenosis. Superior cerebellar arteries patent bilaterally. Both PCAs primarily supplied via the basilar well perfused or distal aspects. Venous sinuses: Grossly patent allowing for timing the contrast bolus. Hypoplastic left transverse and sigmoid sinuses not well assessed. Anatomic variants: None significant.  No aneurysm. Review of the MIP images confirms the above findings IMPRESSION: 1. Acute right M2 branch occlusion, superior division, in keeping with the previously identified acute to early subacute right MCA distribution infarct. 2. Otherwise wide patency of the major arterial vasculature of the head and neck. 3. Extensive ground-glass opacity throughout the visualized lungs, right worse than left, concerning for severe multifocal pneumonia. Electronically Signed   By: Jeannine Boga M.D.   On: 06/07/2020 02:03    CRITICAL CARE Performed by: Barton Dubois MD [facility] hospitalist    Total critical care  time: 35 minutes  Critical care time was exclusive of separately billable procedures and treating other patients.  Critical care was necessary to treat or prevent imminent or life-threatening deterioration.  Critical care was time spent personally by me on the following activities: development of treatment plan with patient and/or surrogate as well as nursing, discussions with consultants, evaluation of patient's response to treatment, examination of patient, obtaining history from patient or surrogate, ordering and performing treatments and interventions, ordering and review of laboratory studies, ordering and review of radiographic studies, pulse oximetry and re-evaluation of patient's condition.    06/07/2020, 2:07 PM   LOS: 6 days

## 2020-06-07 NOTE — Evaluation (Signed)
Clinical/Bedside Swallow Evaluation Patient Details  Name: Joshua Robinson MRN: [id number] Date of Birth: 10/17/1934  Today's Date: 06/07/2020 Time: SLP Start Time (ACUTE ONLY): 1136 SLP Stop Time (ACUTE ONLY): 1152 SLP Time Calculation (min) (ACUTE ONLY): 16 min  Past Medical History:  Past Medical History:  Diagnosis Date  . Arthritis   . Cancer [facility])    skin   Past Surgical History:  Past Surgical History:  Procedure Laterality Date  . CATARACT EXTRACTION Right   . FOOT SURGERY Bilateral 86 years old   "painful flat foot surgery"  . SKIN CANCER EXCISION Right    cheek  . TOTAL KNEE ARTHROPLASTY Right 02/19/2013   Procedure: RIGHT TOTAL KNEE ARTHROPLASTY;  Surgeon: Gearlean Alf, MD;  Location: [facility]S;  Service: Orthopedics;  Laterality: Right;   HPI:  86 year old male with no documented chronic medical problems, but is a reformed smoker of 50 pack years presenting with myalgias, coughing, and shortness of breath that began on 05/24/2020.  The patient states that he visited his PCP on a virtual visit on 05/27/2019.  He was given oral steroids and azithromycin.  His symptoms did not improve.  He continued to have worsening shortness of breath, coughing.  He did have some intermittent chest discomfort with coughing but denied any hemoptysis.  He had some loose stools but did not have any hematochezia or melena.  He also had subjective fevers and chills at home.  He has had 2 doses of the Moderna vaccination but has not been boosted.  EMS was activated and the patient was noted to have oxygen saturation 67% on room air.  He was placed on CPAP and brought to the hospital.  In the emergency department, the patient was subsequently placed on high flow nasal cannula.  He did not tolerate it well and was placed on BiPAP.  He has been subsequently transitioned to heated high flow at 40 L. CTA head and neck yesterday showed complete stroke and neurology did not recommend any intervention as he  seemed to be outside of the treatment window. Pt is tolerating 10 L HFNC today. BSE requested   Assessment / Plan / Recommendation Clinical Impression  Clinical swallowing evaluation completed with Pt sitting upright in bed; Pt presented with overt s/sx of aspiration with limited ice chip and thin liquid trials. Pt did not open his eyes but responded appropriately to y/n questions. Despite max cues he was unable to open his eyes; He did say "yeah" when asked if he wanted water. SLP provided thorough oral care note xerostomia and dried secretions despite best efforts of oral care by nursing staff. When a single ice chip was placed in oral cavity, Pt demonstrated no oral response and fell out of the cavity anteriorly. SLP then placed a 1/2 tsp sip of water in Pts mouth initially with no response with bolus eventually falling back and no swallowing trigger followed by immediate and overt coughing. Recommend continue NPO status and for meds to be administered by alternative means. Consider temporary alternative means of nutrition. ST will continue to follow for goals of care and PO readiness SLP Visit Diagnosis: Dysphagia, unspecified (R13.10)    Aspiration Risk  Severe aspiration risk    Diet Recommendation NPO   Medication Administration: Via alternative means    Other  Recommendations Oral Care Recommendations: Oral care QID   Follow up Recommendations Skilled Nursing facility      Frequency and Duration min 1 x/week  1 week  Prognosis Prognosis for Safe Diet Advancement: Fair Barriers to Reach Goals: Severity of deficits      Swallow Study   General Date of Onset: 06/01/20 HPI: 86 year old male with no documented chronic medical problems, but is a reformed smoker of 50 pack years presenting with myalgias, coughing, and shortness of breath that began on 05/24/2020.  The patient states that he visited his PCP on a virtual visit on 05/27/2019.  He was given oral steroids and  azithromycin.  His symptoms did not improve.  He continued to have worsening shortness of breath, coughing.  He did have some intermittent chest discomfort with coughing but denied any hemoptysis.  He had some loose stools but did not have any hematochezia or melena.  He also had subjective fevers and chills at home.  He has had 2 doses of the Moderna vaccination but has not been boosted.  EMS was activated and the patient was noted to have oxygen saturation 67% on room air.  He was placed on CPAP and brought to the hospital.  In the emergency department, the patient was subsequently placed on high flow nasal cannula.  He did not tolerate it well and was placed on BiPAP.  He has been subsequently transitioned to heated high flow at 40 L. CTA head and neck yesterday showed complete stroke and neurology did not recommend any intervention as he seemed to be outside of the treatment window. Pt is tolerating 10 L HFNC today. BSE requested Type of Study: Bedside Swallow Evaluation Previous Swallow Assessment: none in chart Diet Prior to this Study: NPO Temperature Spikes Noted: No Respiratory Status: Nasal cannula History of Recent Intubation: No Behavior/Cognition: Lethargic/Drowsy;Requires cueing;Doesn't follow directions;Pleasant mood;Cooperative Oral Cavity Assessment: Dry;Excessive secretions;Dried secretions Oral Care Completed by SLP: Yes Oral Cavity - Dentition: Missing dentition;Poor condition Patient Positioning: Upright in bed Baseline Vocal Quality: Normal Volitional Cough: Cognitively unable to elicit Volitional Swallow: Unable to elicit    Oral/Motor/Sensory Function Overall Oral Motor/Sensory Function: Within functional limits   Ice Chips Ice chips: Impaired Presentation: Spoon Oral Phase Impairments: Reduced labial seal;Impaired mastication;Poor awareness of bolus;Reduced lingual movement/coordination Oral Phase Functional Implications: Right anterior spillage   Thin Liquid Thin  Liquid: Impaired Presentation: Spoon Oral Phase Impairments: Reduced labial seal;Reduced lingual movement/coordination;Impaired mastication;Poor awareness of bolus Oral Phase Functional Implications: Left anterior spillage;Right anterior spillage Pharyngeal  Phase Impairments: Suspected delayed Swallow;Multiple swallows;Wet Vocal Quality;Cough - Immediate;Cough - Delayed    Nectar Thick Nectar Thick Liquid: Not tested   Honey Thick Honey Thick Liquid: Not tested   Puree Puree: Not tested   Solid     Solid: Not tested     Gorje Iyer H. Roddie Mc, CCC-SLP Speech Language Pathologist  Wende Bushy 06/07/2020,12:04 PM

## 2020-06-07 NOTE — Plan of Care (Signed)
  Problem: Acute Rehab PT Goals(only PT should resolve) Goal: Pt will Roll Supine to Side Outcome: Progressing Flowsheets (Taken 06/07/2020 1311) Pt will Roll Supine to Side: with mod assist Goal: Pt Will Go Supine/Side To Sit Outcome: Progressing Flowsheets (Taken 06/07/2020 1311) Pt will go Supine/Side to Sit: with moderate assist Goal: Pt Will Go Sit To Supine/Side Outcome: Progressing Flowsheets (Taken 06/07/2020 1311) Pt will go Sit to Supine/Side: with moderate assist Goal: Patient Will Perform Sitting Balance Outcome: Progressing Flowsheets (Taken 06/07/2020 1311) Patient will perform sitting balance:  with moderate assist  1-2 min  with unilateral UE support Goal: Patient Will Transfer Sit To/From Stand Outcome: Progressing Flowsheets (Taken 06/07/2020 1311) Patient will transfer sit to/from stand: with maximum assist  1:12 PM, 06/07/20 M. Sherlyn Lees, PT, DPT Physical Therapist- [place] Office Number: [phone]

## 2020-06-07 NOTE — Progress Notes (Signed)
CODE STROKE  Call time  2313 Beeper   None Exam start  2335 End   2340 Soc   2341 Epic   2341 Rad   2341

## 2020-06-08 ENCOUNTER — Inpatient Hospital Stay (HOSPITAL_COMMUNITY): Payer: Medicare Other

## 2020-06-08 DIAGNOSIS — I4891 Unspecified atrial fibrillation: Secondary | ICD-10-CM | POA: Diagnosis not present

## 2020-06-08 DIAGNOSIS — U071 COVID-19: Secondary | ICD-10-CM | POA: Diagnosis not present

## 2020-06-08 DIAGNOSIS — J9601 Acute respiratory failure with hypoxia: Secondary | ICD-10-CM | POA: Diagnosis not present

## 2020-06-08 DIAGNOSIS — J069 Acute upper respiratory infection, unspecified: Secondary | ICD-10-CM | POA: Diagnosis not present

## 2020-06-08 LAB — GLUCOSE, CAPILLARY
Glucose-Capillary: 127 mg/dL — ABNORMAL HIGH (ref 70–99)
Glucose-Capillary: 117 mg/dL — ABNORMAL HIGH (ref 70–99)
Glucose-Capillary: 128 mg/dL — ABNORMAL HIGH (ref 70–99)
Glucose-Capillary: 115 mg/dL — ABNORMAL HIGH (ref 70–99)
Glucose-Capillary: 133 mg/dL — ABNORMAL HIGH (ref 70–99)

## 2020-06-08 NOTE — Progress Notes (Signed)
PROGRESS NOTE  Joshua Robinson [id number] DOB: 12-19-1934 DOA: 06/01/2020 PCP: Redmond School, MD  Brief History:  86 year old male with no documented chronic medical problems, but is a reformed smoker of 50 pack years presenting with myalgias, coughing, and shortness of breath that began on 05/24/2020.  The patient states that he visited his PCP on a virtual visit on 05/27/2019.  He was given oral steroids and azithromycin.  His symptoms did not improve.  He continued to have worsening shortness of breath, coughing.  He did have some intermittent chest discomfort with coughing but denied any hemoptysis.  He had some loose stools but did not have any hematochezia or melena.  He also had subjective fevers and chills at home.  He has had 2 doses of the Moderna vaccination but has not been boosted.  EMS was activated and the patient was noted to have oxygen saturation 67% on room air.  He was placed on CPAP and brought to the hospital.  In the emergency department, the patient was subsequently placed on high flow nasal cannula.  He did not tolerate it well and was placed on BiPAP.  He has been subsequently transitioned to heated high flow at 40 L.  The patient was given Actemra and started on intravenous steroids. In addition, the patient was noted to have atrial fibrillation with RVR.  He was started on a diltiazem drip.  Assessment/Plan: Acute respiratory failure with hypoxia secondary to COVID-19 -patient is off BIPAP and demonstrating good O2 sat on 6L [place]. -Will continue intravenous Solu-Medrol and as needed bronchodilator. -received Actemra on 06/01/20 -Continue to follow inflammatory markers. -Personally reviewed chest x-ray--diffuse bilateral opacities, requiring left -06/02/2020 CTA chest--negative PE, diffuse groundglass opacities and consolidative opacities -Continue Vitamin C and zinc when able to take PO's -Continue holding antibiotics at this time; procalcitonin level not  elevated. -Patient is afebrile. -Follow blood cultures--neg growth up to date -Will continue avoiding as much as possible the use of sedative agents -Continue to wean off oxygen supplementation as tolerated.  New embolic acute right MCA stroke -Patient with left-sided hemiparesis, dysarthria and left facial droop -Will allow for permissive hypertension -Continue low-dose metoprolol for rate control -Continue full dose Lovenox. -Speech therapy suggesting high risk for aspiration and asking for alternative route for medications and nutrition at this point -Anticipating need a skilled nursing facility at discharge for rehabilitation if patient further improved and stabilizes for that.  -Palliative care is following. -Per discussion with neurology today (Dr. Katherine Roan) repeat CT head without contrast in order to evaluate for any hemorrhagic transformation or edema. -Still with recommendations for patient to be transferred to [facility] for MRI, completion of stroke work-up and further recommendations by neurology.  Atrial fibrillation with RVR -Continue diltiazem drip for now -Transition to oral diltiazem once respiratory status has stabilized and patient can reliably take p.o. -IV metoprolol added to assist  with transition.  -Echo--EF 50-55%, no WMA, RVSP 50 -TSH--0.124 -CHADS-VASc= 3 (age, ASVD) -Continue Lovenox; when able to tolerate p.o. might be a good candidate for Eliquis. -Patient reviewed EKG--atrial fibrillation, nonspecific ST-T wave change  Hyponatremia -Secondary to volume depletion and poor solute intake -Continue judicious IV fluids and follow electrolytes trend.  Elevated troponin -Secondary to demand ischemia due to acute medical illness as well as atrial fibrillation. -No chest pain presently -Echocardiogram--EF 50-55%, no WMA, moderately elevated PASP (50), mod TR  Hyperglycemia -Likely steroid-induced -A1c 5.2 -NovoLog sliding  scale  Presumptive COPD -Patient has had 50-pack-year history of tobacco -Quit tobacco 20 years ago -Continue Combivent -Continue steroids as mentioned above.  [place] -Advance care planning, including the explanation and discussion of advance directives was carried out with the patient and family.  Code status including explanations of "Full Code" and "DNR" and alternatives were discussed in detail.  Discussion of end-of-life issues including but not limited palliative care, hospice care and the concept of hospice, other end-of-life care options, power of attorney for health care decisions, living wills, and physician orders for life-sustaining treatment were also discussed with the patient and family.  Total face to face time 16 minutes. -spouse wants full scope of care -palliative medicine consult; will continue to follow Sanders discussion outcome.  -Overall prognosis is poor.   Status is: Inpatient  Remains inpatient appropriate because:IV treatments appropriate due to intensity of illness or inability to take PO   Dispo: The patient is from: Home  Anticipated d/c is to: Home  Anticipated d/c date is: 3-4 days  Patient currently is not medically stable to d/c.  At this moment planning to transfer to [facility] for MRI at completion of a stroke work-up along with neurology examination.  Overall prognosis is poor.    Family Communication:   Patient's plan of care discussed over the phone with wife 06/06/2020.  Consultants:   Palliative care service, neurology   Code Status:  FULL-confirmed with patient  DVT Prophylaxis:  lovenox treatment dose   Procedures: As Listed in Progress Note Above  Antibiotics: Antibiotics has been discontinued in the setting of low procalcitonin, no fever and stable WBCs.   Subjective: Good oxygen saturation on 6 L high flow nasal cannula; no chest pain, no nausea, no vomiting.  Continues to  have dysarthria, found to be high risk for aspiration by speech therapy and with left facial droop and left hemiparesis.   Objective: Vitals:   06/08/20 1400 06/08/20 1500 06/08/20 1600 06/08/20 1700  BP: (!) 165/78 (!) 179/90 (!) 169/96 (!) 161/93  Pulse: 90 97 89 94  Resp: 19 20 16  (!) 25  Temp: 98.6 F (37 C) 98.6 F (37 C) 98.42 F (36.9 C) 98.24 F (36.8 C)  TempSrc:   Oral   SpO2: 90% 91% 92% 95%  Weight:      Height:        Intake/Output Summary (Last 24 hours) at 06/08/2020 1722 Last data filed at 06/08/2020 1715 Gross per 24 hour  Intake 1311.76 ml  Output 3225 ml  Net -1913.24 ml   Weight change:   Exam: General exam: Alert, awake, oriented to person and place; still with dysarthria and demonstrating left hemiparesis and facial droop.  Using 6 L nasal cannula supplementation.  No fever.  Dry oral mucous membranes. Respiratory system: Scattered rhonchi, no wheezing, no crackles.  No using accessory muscles. Cardiovascular system: Irregular, no rubs, no gallops, no JVD. Gastrointestinal system: Abdomen is nondistended, soft and nontender. No organomegaly or masses felt. Normal bowel sounds heard. Central nervous system: No new focal neurological deficits. Extremities: No cyanosis or clubbing. Skin: No petechiae. Psychiatry: Mood & affect appropriate.   Data Reviewed: I have personally reviewed following labs and imaging studies  Basic Metabolic Panel: Recent Labs  Lab 06/02/20 0430 06/03/20 0521 06/04/20 0528 06/05/20 0336 06/06/20 0432 06/06/20 2358  NA 136 140 143 146* 148* 147*  K 3.8 3.9 3.8 3.6 3.6 3.8  CL 102 105 109 110 111 112*  CO2 23 24 25 25  25  26  GLUCOSE 137* 148* 151* 145* 131* 133*  BUN 23 35* 42* 45* 44* 45*  CREATININE 0.82 0.80 0.85 0.84 0.75 0.76  CALCIUM 8.3* 8.2* 8.0* 8.3* 8.1* 8.1*  MG 2.3 2.7* 2.7* 2.6* 2.6*  --   PHOS 3.1 4.1 3.0 3.6 3.8  --    Liver Function Tests: Recent Labs  Lab 06/02/20 0430 06/03/20 0521  06/04/20 0528 06/05/20 0336 06/06/20 0432  AST 29 25 23 26 31   ALT 36 37 30 32 43  ALKPHOS 106 112 110 108 94  BILITOT 0.6 0.6 0.5 1.1 1.4*  PROT 6.6 6.3* 5.6* 6.2* 6.1*  ALBUMIN 2.6* 2.6* 2.4* 2.8* 2.9*   Coagulation Profile: Recent Labs  Lab 06/06/20 2358  INR 1.5*   CBC: Recent Labs  Lab 06/02/20 0430 06/03/20 0521 06/04/20 0528 06/05/20 0336 06/06/20 0432 06/07/20 0442  WBC 15.3* 17.2* 11.1* 12.5* 12.2* 10.9*  NEUTROABS 13.9* 15.7* 10.1* 11.4* 11.1*  --   HGB 13.1 13.1 12.6* 13.2 13.3 14.4  HCT 38.3* 39.8 36.8* 38.5* 39.8 43.1  MCV 97.0 100.5* 98.7 98.7 99.0 99.5  PLT 285 296 179 190 204 231   CBG: Recent Labs  Lab 06/07/20 2304 06/08/20 0312 06/08/20 0756 06/08/20 1149 06/08/20 1607  GLUCAP 110* 127* 117* 128* 115*   HbA1C: Recent Labs    06/07/20 0442  HGBA1C 5.2   Urine analysis:    Component Value Date/Time   COLORURINE YELLOW 06/01/2020 1215   APPEARANCEUR HAZY (A) 06/01/2020 1215   LABSPEC 1.026 06/01/2020 1215   PHURINE 5.0 06/01/2020 1215   GLUCOSEU NEGATIVE 06/01/2020 1215   HGBUR NEGATIVE 06/01/2020 1215   BILIRUBINUR NEGATIVE 06/01/2020 1215   KETONESUR 5 (A) 06/01/2020 1215   PROTEINUR 100 (A) 06/01/2020 1215   UROBILINOGEN 0.2 02/12/2013 1150   NITRITE NEGATIVE 06/01/2020 1215   LEUKOCYTESUR NEGATIVE 06/01/2020 1215   Sepsis Labs:  Recent Results (from the past 240 hour(s))  Blood culture (routine single)     Status: None   Collection Time: 06/01/20 12:15 PM   Specimen: Right Antecubital; Blood  Result Value Ref Range Status   Specimen Description   Final    RIGHT ANTECUBITAL BOTTLES DRAWN AEROBIC AND ANAEROBIC   Special Requests Blood Culture adequate volume  Final   Culture   Final    NO GROWTH 5 DAYS Performed at [facility], [address], [place], [place] [address]    Report Status 06/06/2020 FINAL  Final  Urine culture     Status: None   Collection Time: 06/01/20 12:15 PM   Specimen: In/Out Cath Urine  Result  Value Ref Range Status   Specimen Description   Final    IN/OUT CATH URINE Performed at [facility], [address] [place], [place] [address]    Special Requests   Final    NONE Performed at [facility], [address], [place], [place] [address]    Culture   Final    NO GROWTH Performed at [facility], [address], [place], [place] [address]    Report Status 06/03/2020 FINAL  Final  Blood Culture (routine x 2)     Status: None   Collection Time: 06/01/20 12:30 PM   Specimen: BLOOD RIGHT FOREARM  Result Value Ref Range Status   Specimen Description   Final    BLOOD RIGHT FOREARM BOTTLES DRAWN AEROBIC AND ANAEROBIC   Special Requests Blood Culture adequate volume  Final   Culture   Final    NO  GROWTH 5 DAYS Performed at [facility], [address] [place], [place] [address]    Report Status 06/06/2020 FINAL  Final  MRSA PCR Screening     Status: None   Collection Time: 06/01/20 12:40 PM   Specimen: Nasopharyngeal  Result Value Ref Range Status   MRSA by PCR NEGATIVE NEGATIVE Final    Comment:        The GeneXpert MRSA Assay (FDA approved for NASAL specimens only), is one component of a comprehensive MRSA colonization surveillance program. It is not intended to diagnose MRSA infection nor to guide or monitor treatment for MRSA infections. Performed at [facility], [address] [place], [place] [address]   MRSA PCR Screening     Status: None   Collection Time: 06/02/20  2:01 PM   Specimen: Nasopharyngeal  Result Value Ref Range Status   MRSA by PCR NEGATIVE NEGATIVE Final    Comment:        The GeneXpert MRSA Assay (FDA approved for NASAL specimens only), is one component of a comprehensive MRSA colonization surveillance program. It is not intended to diagnose MRSA infection nor to guide or monitor treatment for MRSA infections. Performed at [facility], [address] [place], [place] [address]      Scheduled Meds: . vitamin C  500 mg Oral  Daily  . Chlorhexidine Gluconate Cloth  6 each Topical Daily  . enoxaparin (LOVENOX) injection  80 mg Subcutaneous Q12H  . folic acid  1 mg Intravenous Daily  . insulin aspart  0-9 Units Subcutaneous Q4H  . mouth rinse  15 mL Mouth Rinse BID  . methylPREDNISolone (SOLU-MEDROL) injection  80 mg Intravenous Q12H  . metoprolol tartrate  2.5 mg Intravenous Q8H  . pantoprazole (PROTONIX) IV  40 mg Intravenous QHS  . sodium chloride flush  3 mL Intravenous Q12H  . sodium chloride flush  3 mL Intravenous Q12H  . thiamine injection  100 mg Intravenous Daily  . zinc sulfate  220 mg Oral Daily   Continuous Infusions: . sodium chloride    . dextrose 5 % and 0.45% NaCl 50 mL/hr at 06/08/20 1715  . sodium chloride      Procedures/Studies: CT ANGIO NECK W OR WO CONTRAST  Result Date: 06/07/2020 CLINICAL DATA:  Follow-up examination for acute stroke. EXAM: CT ANGIOGRAPHY HEAD AND NECK TECHNIQUE: Multidetector CT imaging of the head and neck was performed using the standard protocol during bolus administration of intravenous contrast. Multiplanar CT image reconstructions and MIPs were obtained to evaluate the vascular anatomy. Carotid stenosis measurements (when applicable) are obtained utilizing NASCET criteria, using the distal internal carotid diameter as the denominator. CONTRAST:  130mL OMNIPAQUE IOHEXOL 350 MG/ML SOLN COMPARISON:  Prior head CT from 06/06/2020. FINDINGS: Delete that CTA NECK FINDINGS Aortic arch: Visualized aortic arch of normal caliber with normal 3 vessel morphology. Moderate atheromatous change about the arch and origin of the great vessels without hemodynamically significant stenosis. Right carotid system: Right common carotid artery patent from its origin to the bifurcation without stenosis. Scattered calcified plaque about the right bifurcation without hemodynamically significant stenosis. Right ICA patent distally to the skull base without stenosis, dissection or occlusion. Left  carotid system: Left common and internal carotid arteries patent without stenosis, dissection or occlusion. No significant atheromatous change or irregularity about the left bifurcation. Vertebral arteries: Both vertebral arteries arise from subclavian arteries. No proximal subclavian artery stenosis. Both vertebral arteries widely patent within the neck without stenosis, dissection or occlusion. Skeleton: No visible  acute osseous abnormality. No discrete or worrisome osseous lesions. Moderate cervical spondylosis noted at C4-5 and C5-6. Other neck: No other acute soft tissue abnormality within the neck. No mass or adenopathy. Upper chest: Extensive multifocal ground-glass opacity seen throughout the visualized lungs, right worse than left, concerning for severe multifocal pneumonia. Review of the MIP images confirms the above findings CTA HEAD FINDINGS Anterior circulation: Petrous segments patent bilaterally. Mild atheromatous change within the carotid siphons without hemodynamically significant stenosis. A1 segments widely patent. Normal anterior communicating artery complex. Anterior cerebral arteries patent to their distal aspects without stenosis. No M1 stenosis or occlusion. Normal MCA bifurcations. On the right, there is acute occlusion of a proximal right M2 branch, superior division (series 7, image 88). Inferior division and its branches remain patent. Contralateral left MCA branches well perfused and patent. Posterior circulation: Both V4 segments patent to the vertebrobasilar junction without stenosis. Right PICA patent and normal. Left PICA not seen. Basilar patent to its distal aspect without stenosis. Superior cerebellar arteries patent bilaterally. Both PCAs primarily supplied via the basilar well perfused or distal aspects. Venous sinuses: Grossly patent allowing for timing the contrast bolus. Hypoplastic left transverse and sigmoid sinuses not well assessed. Anatomic variants: None significant.   No aneurysm. Review of the MIP images confirms the above findings IMPRESSION: 1. Acute right M2 branch occlusion, superior division, in keeping with the previously identified acute to early subacute right MCA distribution infarct. 2. Otherwise wide patency of the major arterial vasculature of the head and neck. 3. Extensive ground-glass opacity throughout the visualized lungs, right worse than left, concerning for severe multifocal pneumonia. Electronically Signed   By: Jeannine Boga M.D.   On: 06/07/2020 02:03   CT Angio Chest PE W and/or Wo Contrast  Result Date: 06/01/2020 CLINICAL DATA:  cough, COVID+, elevated d dimer EXAM: CT ANGIOGRAPHY CHEST WITH CONTRAST TECHNIQUE: Multidetector CT imaging of the chest was performed using the standard protocol during bolus administration of intravenous contrast. Multiplanar CT image reconstructions and MIPs were obtained to evaluate the vascular anatomy. CONTRAST:  66mL OMNIPAQUE IOHEXOL 350 MG/ML SOLN COMPARISON:  Xr chest 06/01/20, xr chest 02/12/13 FINDINGS: Cardiovascular: Satisfactory opacification of the pulmonary arteries to the segmental level. No evidence of pulmonary embolism. Enlarged right atria. Otherwise heart size. No significant pericardial effusion. The thoracic aorta is normal in caliber. Mild atherosclerotic plaque of the thoracic aorta. Mild 2 vessel coronary artery calcifications. Mediastinum/Nodes: No enlarged mediastinal, hilar, or axillary lymph nodes. Thyroid gland, trachea, and esophagus demonstrate no significant findings. Lungs/Pleura: Diffuse ground glass and consolidative opacities involving the majority of the lungs. Limited evaluation for pulmonary nodule due to minimal aerated lung. No definite pulmonary mass. No pleural effusion. No pneumothorax. Upper Abdomen: No acute abnormality. Musculoskeletal: No chest wall abnormality No suspicious lytic or blastic osseous lesions. No acute displaced fracture. Multilevel degenerative changes  of the spine. Review of the MIP images confirms the above findings. IMPRESSION: 1. No pulmonary embolus. 2. Diffuse multifocal pneumonia in the setting of COVID-19 infection. 3.  Aortic Atherosclerosis (ICD10-I70.0). Electronically Signed   By: Iven Finn M.D.   On: 06/01/2020 15:26   US Carotid Bilateral (at [facility] and [facility] only)  Result Date: 06/07/2020 CLINICAL DATA:  Altered mental status, left-sided weakness, right MCA territory infarct by CT EXAM: BILATERAL CAROTID DUPLEX ULTRASOUND TECHNIQUE: Pearline Cables scale imaging, color Doppler and duplex ultrasound were performed of bilateral carotid and vertebral arteries in the neck. COMPARISON:  06/06/2020 FINDINGS: Criteria: Quantification of carotid stenosis is based  on velocity parameters that correlate the residual internal carotid diameter with NASCET-based stenosis levels, using the diameter of the distal internal carotid lumen as the denominator for stenosis measurement. The following velocity measurements were obtained: RIGHT ICA: 72/11 cm/sec CCA: 0000000 cm/sec SYSTOLIC ICA/CCA RATIO:  0.9 ECA: 102 cm/sec LEFT ICA: 60/14 cm/sec CCA: 123456 cm/sec SYSTOLIC ICA/CCA RATIO:  0.8 ECA: 76 cm/sec RIGHT CAROTID ARTERY: Minor echogenic shadowing plaque formation. No hemodynamically significant right ICA stenosis, velocity elevation, or turbulent flow. Degree of narrowing less than 50%. RIGHT VERTEBRAL ARTERY:  Normal antegrade flow LEFT CAROTID ARTERY: Similar scattered minor plaque formation. No hemodynamically significant left ICA stenosis, velocity elevation, or turbulent flow. LEFT VERTEBRAL ARTERY:  Normal antegrade flow IMPRESSION: Minor carotid atherosclerosis. No hemodynamically significant ICA stenosis. Degree of narrowing less than 50% bilaterally by ultrasound criteria. Patent antegrade vertebral flow bilaterally Electronically Signed   By: Jerilynn Mages.  Shick M.D.   On: 06/07/2020 12:05   DG Chest Port 1 View  Result Date: 06/01/2020 CLINICAL DATA:  PT on Bipap  unable to give hx. Positive Covid test. Hx of CA. ER notes:Pt brought in by [facility] from home with c/o SOB and productive green sputum x 7 days. EMS arrived and pt was found to be 67% on RA. Pt was placed on CPAP and sats increased to 85%. Pt was also given 4 tablets of Nitro by EMS. EMS reports crackles in all lung fields. HR 150, resp 50 per EMS EXAM: PORTABLE CHEST 1 VIEW COMPARISON:  02/12/2013 FINDINGS: Bilateral hazy airspace lung opacities are noted consistent multifocal pneumonia. No evidence of pulmonary edema. No pleural effusion or pneumothorax. Cardiac silhouette is normal in size. No mediastinal or hilar masses. Skeletal structures are grossly intact. IMPRESSION: Bilateral hazy airspace lung opacities consistent with multifocal pneumonia, pattern consistent with atypical/viral infection including COVID 19. Electronically Signed   By: Lajean Manes M.D.   On: 06/01/2020 12:37   ECHOCARDIOGRAM COMPLETE  Result Date: 06/02/2020    ECHOCARDIOGRAM REPORT   Patient Name:   LOCHLAIN BIGNER Date of Exam: 06/02/2020 Medical Rec #:  [id number]         Height:       68.0 in Accession #:    [id number]        Weight:       165.0 lb Date of Birth:  1935/01/05         BSA:          1.883 m Patient Age:    51 years          BP:           107/75 mmHg Patient Gender: M                 HR:           101 bpm. Exam Location:  [facility] Procedure: 2D Echo, Cardiac Doppler and Color Doppler Indications:    Dyspnea R06.00  History:        Patient has no prior history of Echocardiogram examinations.                 Arrythmias:Atrial Fibrillation. Pneumonia due to COVID-19 virus.  Sonographer:    Alvino Chapel RCS Referring Phys: [phone] COURAGE EMOKPAE IMPRESSIONS  1. Left ventricular ejection fraction, by estimation, is 50 to 55%. The left ventricle has low normal function. The left ventricle has no regional wall motion abnormalities. Left ventricular diastolic parameters are indeterminate.  2. Right ventricular systolic  function is normal.  The right ventricular size is normal. There is moderately elevated pulmonary artery systolic pressure. The estimated right ventricular systolic pressure is 60.1 mmHg.  3. Left atrial size was mildly dilated.  4. Right atrial size was moderately dilated.  5. There is a trivial pericardial effusion posterior to the left ventricle.  6. The mitral valve is grossly normal. Mild mitral valve regurgitation.  7. Tricuspid valve regurgitation is moderate.  8. The aortic valve is tricuspid. There is mild calcification of the aortic valve. Aortic valve regurgitation is not visualized.  9. The inferior vena cava is dilated in size with <50% respiratory variability, suggesting right atrial pressure of 15 mmHg. FINDINGS  Left Ventricle: Left ventricular ejection fraction, by estimation, is 50 to 55%. The left ventricle has low normal function. The left ventricle has no regional wall motion abnormalities. The left ventricular internal cavity size was normal in size. There is no left ventricular hypertrophy. Left ventricular diastolic parameters are indeterminate. Right Ventricle: The right ventricular size is normal. No increase in right ventricular wall thickness. Right ventricular systolic function is normal. There is moderately elevated pulmonary artery systolic pressure. The tricuspid regurgitant velocity is 2.96 m/s, and with an assumed right atrial pressure of 15 mmHg, the estimated right ventricular systolic pressure is 09.3 mmHg. Left Atrium: Left atrial size was mildly dilated. Right Atrium: Right atrial size was moderately dilated. Pericardium: Trivial pericardial effusion is present. The pericardial effusion is posterior to the left ventricle. Mitral Valve: The mitral valve is grossly normal. Mild to moderate mitral annular calcification. Mild mitral valve regurgitation. Tricuspid Valve: The tricuspid valve is grossly normal. Tricuspid valve regurgitation is moderate. Aortic Valve: The aortic valve  is tricuspid. There is mild calcification of the aortic valve. There is mild to moderate aortic valve annular calcification. Aortic valve regurgitation is not visualized. Pulmonic Valve: The pulmonic valve was grossly normal. Pulmonic valve regurgitation is trivial. Aorta: The aortic root is normal in size and structure. Venous: The inferior vena cava is dilated in size with less than 50% respiratory variability, suggesting right atrial pressure of 15 mmHg. IAS/Shunts: No atrial level shunt detected by color flow Doppler.  LEFT VENTRICLE PLAX 2D LVIDd:         4.50 cm LVIDs:         3.00 cm LV PW:         0.90 cm LV IVS:        1.00 cm LVOT diam:     2.10 cm LV SV:         42 LV SV Index:   22 LVOT Area:     3.46 cm  RIGHT VENTRICLE TAPSE (M-mode): 1.4 cm LEFT ATRIUM             Index       RIGHT ATRIUM           Index LA diam:        3.80 cm 2.02 cm/m  RA Area:     25.60 cm LA Vol (A2C):   59.9 ml 31.81 ml/m RA Volume:   86.30 ml  45.82 ml/m LA Vol (A4C):   66.5 ml 35.31 ml/m LA Biplane Vol: 67.7 ml 35.95 ml/m  AORTIC VALVE LVOT Vmax:   64.30 cm/s LVOT Vmean:  39.700 cm/s LVOT VTI:    0.120 m  AORTA Ao Root diam: 3.60 cm MITRAL VALVE                TRICUSPID VALVE MV Area (PHT): 4.17 cm  TR Peak grad:   35.0 mmHg MV Decel Time: 182 msec     TR Vmax:        296.00 cm/s MV E velocity: 139.00 cm/s                             SHUNTS                             Systemic VTI:  0.12 m                             Systemic Diam: 2.10 cm Rozann Lesches MD Electronically signed by Rozann Lesches MD Signature Date/Time: 06/02/2020/3:26:04 PM    Final    CT HEAD CODE STROKE WO CONTRAST  Result Date: 06/07/2020 CLINICAL DATA:  Code stroke. Initial evaluation for acute stroke, left-sided weakness. EXAM: CT HEAD WITHOUT CONTRAST TECHNIQUE: Contiguous axial images were obtained from the base of the skull through the vertex without intravenous contrast. COMPARISON:  None available. FINDINGS: Brain: Age-related  cerebral atrophy with chronic small vessel ischemic disease. Evolving area of cytotoxic edema seen involving the right insula and overlying right frontal operculum and frontal cortex, consistent with an evolving acute to early subacute right MCA distribution infarct. No associated hemorrhage or significant regional mass effect. No other acute large vessel territory infarct. No intracranial hemorrhage. No mass lesion, midline shift or mass effect. No hydrocephalus or extra-axial fluid collection. Vascular: Question hyperdense vessel at the right sylvian fissure, possibly reflecting thrombus in a right M2 branch (series 2, image 23). Scattered vascular calcifications noted within the carotid siphons. Skull: Scalp soft tissues and calvarium within normal limits. Sinuses/Orbits: Globes and orbital soft tissues demonstrate no acute finding. Patient status post ocular lens replacement on the right. Air-fluid levels noted within the right maxillary and sphenoid sinuses. Mastoid air cells are clear. Other: None. ASPECTS [facility] Stroke Program Early CT Score) - Ganglionic level infarction (caudate, lentiform nuclei, internal capsule, insula, M1-M3 cortex): 6 - Supraganglionic infarction (M4-M6 cortex): 1 Total score (0-10 with 10 being normal): 7 IMPRESSION: 1. Evolving cytotoxic edema involving the right insula and overlying right frontal lobe, consistent with acute to early subacute right MCA distribution infarct. No hemorrhage. 2. ASPECTS is 7. 3. Question hyperdense vessel at the base of the right sylvian fissure, possibly reflecting thrombus within a proximal right M2 branch. 4. Underlying atrophy with chronic small vessel ischemic disease. Critical Value/emergent results were called by telephone at the time of interpretation on 06/07/2020 at 12:08 am to provider DAVID ORTIZ , who verbally acknowledged these results. Electronically Signed   By: Jeannine Boga M.D.   On: 06/07/2020 00:10   CT ANGIO HEAD CODE  STROKE  Result Date: 06/07/2020 CLINICAL DATA:  Follow-up examination for acute stroke. EXAM: CT ANGIOGRAPHY HEAD AND NECK TECHNIQUE: Multidetector CT imaging of the head and neck was performed using the standard protocol during bolus administration of intravenous contrast. Multiplanar CT image reconstructions and MIPs were obtained to evaluate the vascular anatomy. Carotid stenosis measurements (when applicable) are obtained utilizing NASCET criteria, using the distal internal carotid diameter as the denominator. CONTRAST:  180mL OMNIPAQUE IOHEXOL 350 MG/ML SOLN COMPARISON:  Prior head CT from 06/06/2020. FINDINGS: Delete that CTA NECK FINDINGS Aortic arch: Visualized aortic arch of normal caliber with normal 3 vessel morphology. Moderate atheromatous change about the arch and origin of  the great vessels without hemodynamically significant stenosis. Right carotid system: Right common carotid artery patent from its origin to the bifurcation without stenosis. Scattered calcified plaque about the right bifurcation without hemodynamically significant stenosis. Right ICA patent distally to the skull base without stenosis, dissection or occlusion. Left carotid system: Left common and internal carotid arteries patent without stenosis, dissection or occlusion. No significant atheromatous change or irregularity about the left bifurcation. Vertebral arteries: Both vertebral arteries arise from subclavian arteries. No proximal subclavian artery stenosis. Both vertebral arteries widely patent within the neck without stenosis, dissection or occlusion. Skeleton: No visible acute osseous abnormality. No discrete or worrisome osseous lesions. Moderate cervical spondylosis noted at C4-5 and C5-6. Other neck: No other acute soft tissue abnormality within the neck. No mass or adenopathy. Upper chest: Extensive multifocal ground-glass opacity seen throughout the visualized lungs, right worse than left, concerning for severe  multifocal pneumonia. Review of the MIP images confirms the above findings CTA HEAD FINDINGS Anterior circulation: Petrous segments patent bilaterally. Mild atheromatous change within the carotid siphons without hemodynamically significant stenosis. A1 segments widely patent. Normal anterior communicating artery complex. Anterior cerebral arteries patent to their distal aspects without stenosis. No M1 stenosis or occlusion. Normal MCA bifurcations. On the right, there is acute occlusion of a proximal right M2 branch, superior division (series 7, image 88). Inferior division and its branches remain patent. Contralateral left MCA branches well perfused and patent. Posterior circulation: Both V4 segments patent to the vertebrobasilar junction without stenosis. Right PICA patent and normal. Left PICA not seen. Basilar patent to its distal aspect without stenosis. Superior cerebellar arteries patent bilaterally. Both PCAs primarily supplied via the basilar well perfused or distal aspects. Venous sinuses: Grossly patent allowing for timing the contrast bolus. Hypoplastic left transverse and sigmoid sinuses not well assessed. Anatomic variants: None significant.  No aneurysm. Review of the MIP images confirms the above findings IMPRESSION: 1. Acute right M2 branch occlusion, superior division, in keeping with the previously identified acute to early subacute right MCA distribution infarct. 2. Otherwise wide patency of the major arterial vasculature of the head and neck. 3. Extensive ground-glass opacity throughout the visualized lungs, right worse than left, concerning for severe multifocal pneumonia. Electronically Signed   By: Jeannine Boga M.D.   On: 06/07/2020 02:03    CRITICAL CARE Performed by: Barton Dubois MD [facility] hospitalist    Total critical care time: 35 minutes  Critical care time was exclusive of separately billable procedures and treating other patients.  Critical care was necessary to  treat or prevent imminent or life-threatening deterioration.  Critical care was time spent personally by me on the following activities: development of treatment plan with patient and/or surrogate as well as nursing, discussions with consultants, evaluation of patient's response to treatment, examination of patient, obtaining history from patient or surrogate, ordering and performing treatments and interventions, ordering and review of laboratory studies, ordering and review of radiographic studies, pulse oximetry and re-evaluation of patient's condition.    06/08/2020, 5:22 PM   LOS: 7 days

## 2020-06-08 NOTE — Progress Notes (Signed)
Report called to RN receiving pt. Wife [name] notified of transfer. All belongings sent with patient.

## 2020-06-09 ENCOUNTER — Inpatient Hospital Stay (HOSPITAL_COMMUNITY): Payer: Medicare Other

## 2020-06-09 DIAGNOSIS — R4182 Altered mental status, unspecified: Secondary | ICD-10-CM

## 2020-06-09 DIAGNOSIS — I6340 Cerebral infarction due to embolism of unspecified cerebral artery: Secondary | ICD-10-CM | POA: Insufficient documentation

## 2020-06-09 DIAGNOSIS — I639 Cerebral infarction, unspecified: Secondary | ICD-10-CM | POA: Diagnosis not present

## 2020-06-09 DIAGNOSIS — U071 COVID-19: Secondary | ICD-10-CM | POA: Diagnosis not present

## 2020-06-09 DIAGNOSIS — I4891 Unspecified atrial fibrillation: Secondary | ICD-10-CM | POA: Diagnosis not present

## 2020-06-09 DIAGNOSIS — G9341 Metabolic encephalopathy: Secondary | ICD-10-CM

## 2020-06-09 DIAGNOSIS — J9601 Acute respiratory failure with hypoxia: Secondary | ICD-10-CM | POA: Diagnosis not present

## 2020-06-09 LAB — COMPREHENSIVE METABOLIC PANEL
ALT: 327 U/L — ABNORMAL HIGH (ref 0–44)
AST: 121 U/L — ABNORMAL HIGH (ref 15–41)
Albumin: 2.8 g/dL — ABNORMAL LOW (ref 3.5–5.0)
Alkaline Phosphatase: 75 U/L (ref 38–126)
Anion gap: 11 (ref 5–15)
BUN: 40 mg/dL — ABNORMAL HIGH (ref 8–23)
CO2: 23 mmol/L (ref 22–32)
Calcium: 7.9 mg/dL — ABNORMAL LOW (ref 8.9–10.3)
Chloride: 111 mmol/L (ref 98–111)
Creatinine, Ser: 0.64 mg/dL (ref 0.61–1.24)
GFR, Estimated: >60 mL/min (ref >60)
Glucose, Bld: 129 mg/dL — ABNORMAL HIGH (ref 70–99)
Potassium: 4.2 mmol/L (ref 3.5–5.1)
Sodium: 145 mmol/L (ref 135–145)
Total Bilirubin: 6.9 mg/dL — ABNORMAL HIGH (ref 0.3–1.2)
Total Protein: 5.5 g/dL — ABNORMAL LOW (ref 6.5–8.1)

## 2020-06-09 LAB — BLOOD GAS, ARTERIAL
Acid-Base Excess: 1.6 mmol/L (ref 0.0–2.0)
Bicarbonate: 24.7 mmol/L (ref 20.0–28.0)
Drawn by: 275531
FIO2: 32
O2 Saturation: 86.8 %
Patient temperature: 36.7
pCO2 arterial: 32.4 mmHg (ref 32.0–48.0)
pH, Arterial: 7.493 — ABNORMAL HIGH (ref 7.350–7.450)
pO2, Arterial: 51.1 mmHg — ABNORMAL LOW (ref 83.0–108.0)

## 2020-06-09 LAB — URINALYSIS, ROUTINE W REFLEX MICROSCOPIC
Bacteria, UA: NONE SEEN
Bilirubin Urine: NEGATIVE
Glucose, UA: NEGATIVE mg/dL
Ketones, ur: NEGATIVE mg/dL
Leukocytes,Ua: NEGATIVE
Nitrite: NEGATIVE
Protein, ur: NEGATIVE mg/dL
Specific Gravity, Urine: 1.024 (ref 1.005–1.030)
pH: 6 (ref 5.0–8.0)

## 2020-06-09 LAB — GLUCOSE, CAPILLARY
Glucose-Capillary: 104 mg/dL — ABNORMAL HIGH (ref 70–99)
Glucose-Capillary: 106 mg/dL — ABNORMAL HIGH (ref 70–99)
Glucose-Capillary: 108 mg/dL — ABNORMAL HIGH (ref 70–99)
Glucose-Capillary: 110 mg/dL — ABNORMAL HIGH (ref 70–99)
Glucose-Capillary: 112 mg/dL — ABNORMAL HIGH (ref 70–99)
Glucose-Capillary: 139 mg/dL — ABNORMAL HIGH (ref 70–99)

## 2020-06-09 LAB — CBC
HCT: 45.6 % (ref 39.0–52.0)
Hemoglobin: 16 g/dL (ref 13.0–17.0)
MCH: 33.5 pg (ref 26.0–34.0)
MCHC: 35.1 g/dL (ref 30.0–36.0)
MCV: 95.6 fL (ref 80.0–100.0)
Platelets: 192 10*3/uL (ref 150–400)
RBC: 4.77 MIL/uL (ref 4.22–5.81)
RDW: 13 % (ref 11.5–15.5)
WBC: 10.6 10*3/uL — ABNORMAL HIGH (ref 4.0–10.5)
nRBC: 0 % (ref 0.0–0.2)

## 2020-06-09 LAB — BILIRUBIN, FRACTIONATED(TOT/DIR/INDIR)
Bilirubin, Direct: 3.5 mg/dL — ABNORMAL HIGH (ref 0.0–0.2)
Indirect Bilirubin: 3.5 mg/dL — ABNORMAL HIGH (ref 0.3–0.9)
Total Bilirubin: 7 mg/dL — ABNORMAL HIGH (ref 0.3–1.2)

## 2020-06-09 LAB — AMMONIA: Ammonia: 44 umol/L — ABNORMAL HIGH (ref 9–35)

## 2020-06-09 MED ORDER — PROSOURCE TF PO LIQD
45.0000 mL | Freq: Two times a day (BID) | ORAL | Status: DC
  Administered 2020-06-09 – 2020-06-17 (×16): 45 mL
  Filled 2020-06-09 (×20): qty 45

## 2020-06-09 MED ORDER — METHYLPREDNISOLONE SODIUM SUCC 40 MG IJ SOLR
40.0000 mg | Freq: Two times a day (BID) | INTRAMUSCULAR | Status: DC
  Administered 2020-06-09 – 2020-06-11 (×5): 40 mg via INTRAVENOUS
  Filled 2020-06-09 (×5): qty 1

## 2020-06-09 MED ORDER — DEXTROSE-NACL 5-0.9 % IV SOLN: INTRAVENOUS | Status: DC

## 2020-06-09 MED ORDER — ENOXAPARIN SODIUM 80 MG/0.8ML ~~LOC~~ SOLN: 70.0000 mg | Freq: Two times a day (BID) | SUBCUTANEOUS | Status: DC

## 2020-06-09 MED ORDER — ASPIRIN 300 MG RE SUPP
300.0000 mg | Freq: Every day | RECTAL | Status: DC
  Administered 2020-06-10: 300 mg via RECTAL
  Filled 2020-06-09: qty 1

## 2020-06-09 MED ORDER — OSMOLITE 1.2 CAL PO LIQD: 1000.0000 mL | ORAL | Status: DC

## 2020-06-09 MED ORDER — OSMOLITE 1.5 CAL PO LIQD
1000.0000 mL | ORAL | Status: DC
  Administered 2020-06-09 – 2020-06-16 (×7): 1000 mL
  Filled 2020-06-09 (×12): qty 1000

## 2020-06-09 NOTE — Progress Notes (Signed)
  Speech Language Pathology Treatment: Dysphagia  Patient Details Name: Joshua Robinson MRN: [id number] DOB: 04-Nov-1934 Today's Date: 06/09/2020 Time: 7628-3151 SLP Time Calculation (min) (ACUTE ONLY): 25 min  Assessment / Plan / Recommendation Clinical Impression  Pt alert after oral care, following commands and assisting me as able when clearing his mouth of debris. His oral strength gradually improved though the session. Initially he had poor labial seal, almost no ability to manipulate a piece of ice. By the end he was able to seal his lips on a spoon and initiate partial posterior transit with a 1/2 teaspoon of honey thick liquid and puree. Swallow response palpated after prolonged effort. Pt is not ready for an MBS or a diet, but he was applying effort to trials. His prognosis for meaningful oral intake is poor but will attempt ongoing trials and interventions throughout the week. Recommended cortrak to MD who reported it was already ordered.   HPI HPI: 86 year old male with no documented chronic medical problems, but is a reformed smoker of 50 pack years presenting with myalgias, coughing, and shortness of breath that began on 05/24/2020.  The patient states that he visited his PCP on a virtual visit on 05/27/2019.  He was given oral steroids and azithromycin.  His symptoms did not improve.  He continued to have worsening shortness of breath, coughing.  He did have some intermittent chest discomfort with coughing but denied any hemoptysis.  He had some loose stools but did not have any hematochezia or melena.  He also had subjective fevers and chills at home.  He has had 2 doses of the Moderna vaccination but has not been boosted.  EMS was activated and the patient was noted to have oxygen saturation 67% on room air.  He was placed on CPAP and brought to the hospital.  In the emergency department, the patient was subsequently placed on high flow nasal cannula.  He did not tolerate it well and was  placed on BiPAP.  He has been subsequently transitioned to heated high flow at 40 L. CT showed Evolving cytotoxic edema involving the right insula and overlying right frontal lobe, consistent with acute to early subacute right  MCA distribution infarct.. Pt is tolerating 10 L HFNC today. BSE requested      SLP Plan  Continue with current plan of care       Recommendations  Diet recommendations: NPO Medication Administration: Via alternative means                Oral Care Recommendations: Oral care QID Follow up Recommendations: Skilled Nursing facility SLP Visit Diagnosis: Dysphagia, unspecified (R13.10) Plan: Continue with current plan of care       GO                Emmalene Kattner, Katherene Ponto 06/09/2020, 2:46 PM

## 2020-06-09 NOTE — Progress Notes (Signed)
EEG complete - results pending 

## 2020-06-09 NOTE — Progress Notes (Addendum)
PROGRESS NOTE  Joshua Robinson [id number] DOB: 02/22/1935 DOA: 06/01/2020  PCP: Redmond School, MD  Brief History/Interval Summary: 86 year old male with no documented chronic medical problems, but is a reformed smoker of 50 pack years presenting with myalgias, coughing, and shortness of breath that began on 05/24/2020.The patient states that he visited his PCP on a virtual visit on 05/27/2019. He was given oral steroids and azithromycin. His symptoms did not improve. He continued to have worsening shortness of breath, coughing. He has had 2 doses of the Moderna vaccination but has not been boosted.  Found to be positive for COVID-19. In the emergency department, the patient was subsequently placed on high flow nasal cannula. He did not tolerate it well and was placed on BiPAP. He has been subsequently transitioned to heated high flow.  Hospitalized to the intensive care unit and independent.  Respiratory status started improving however patient then developed embolic stroke.  Subsequently transferred to [facility].  Reason for Visit: Pneumonia due to COVID-19.  Acute respiratory failure with hypoxia.  Acute embolic stroke.  Consultants: Pulmonology.  Neurology  Procedures: None yet  Antibiotics: Anti-infectives (From admission, onward)   Start     Dose/Rate Route Frequency Ordered Stop   06/02/20 1000  cefTRIAXone (ROCEPHIN) 2 g in sodium chloride 0.9 % 100 mL IVPB  Status:  Discontinued        2 g 200 mL/hr over 30 Minutes Intravenous Every 24 hours 06/02/20 0638 06/04/20 1004   06/02/20 1000  azithromycin (ZITHROMAX) 500 mg in sodium chloride 0.9 % 250 mL IVPB  Status:  Discontinued        500 mg 250 mL/hr over 60 Minutes Intravenous Every 24 hours 06/02/20 0638 06/04/20 1004   06/02/20 0600  vancomycin (VANCOREADY) IVPB 750 mg/150 mL  Status:  Discontinued        750 mg 150 mL/hr over 60 Minutes Intravenous Every 12 hours 06/01/20 1333 06/02/20 1136   06/01/20  1245  vancomycin (VANCOREADY) IVPB 1500 mg/300 mL        1,500 mg 150 mL/hr over 120 Minutes Intravenous  Once 06/01/20 1239 06/01/20 1627   06/01/20 1230  cefTRIAXone (ROCEPHIN) 1 g in sodium chloride 0.9 % 100 mL IVPB        1 g 200 mL/hr over 30 Minutes Intravenous  Once 06/01/20 1223 06/01/20 1337   06/01/20 1230  azithromycin (ZITHROMAX) 500 mg in sodium chloride 0.9 % 250 mL IVPB        500 mg 250 mL/hr over 60 Minutes Intravenous  Once 06/01/20 1223 06/01/20 1425      Subjective/Interval History: Patient noted to be poorly responsive this morning.  Not really communicating much.  Eyes do open.     Assessment/Plan:  Acute Hypoxic Resp. Failure/Pneumonia due to COVID-19   Recent Labs  Lab 06/03/20 0521 06/04/20 0528 06/05/20 0336 06/06/20 0432  DDIMER 16.67* >20.00* >20.00* >20.00*  FERRITIN 509* 408* 549* 549*  CRP 18.9* 10.3* 6.6* 3.5*  ALT 37 30 32 43  PROCALCITON 0.19  --   --   --     Objective findings: Fever: Noted to be afebrile Oxygen requirements: Currently on nasal cannula 3 L/min.  Saturations in the early 90s.  COVID 19 Therapeutics: Antibacterials: Antibiotics were given in the ED but not continued.  Procalcitonin was 0.19 Remdesivir: It appears that he did not receive Remdesivir Steroids: Remains on Solu-Medrol 80 mg twice a day Diuretics: Not on scheduled diuretics Inhaled Steroids: None noted Actemra/Baricitinib:  Patient was given Actemra PUD Prophylaxis: On Protonix DVT Prophylaxis: On therapeutic Lovenox  Tested positive on 1/16  From a respiratory standpoint patient seems to have improved over the last 3 to 4 days.  He is now saturating normal on 3 L of oxygen by nasal cannula.  Patient received Actemra and was placed on Solu-Medrol.  Did not get Remdesivir.  Underwent CT angiogram on 1/17 which was negative for PE.  No need for antibacterials currently.  Continue to monitor.  Acute embolic right MCA stroke/acute metabolic  encephalopathy Patient noted to be confused this morning.  Poorly responsive though he does open his eyes.  Other vital signs are stable.  He underwent a CT head last night which did not show any new findings.  Patient was found to have an acute stroke.  Discussions were held with neurology.  Patient was transferred to [facility].  MRI has been ordered and is pending.  Will consult neurology.  We will also repeat labs UA and ammonia level to see if there is any other reason for his encephalopathy.  We will also check ABG. Patient very high risk for aspiration due to his alteration mental status.  We will keep n.p.o. for now. ABG does not show hypercapnia. Ammonia level is only minimally elevated and not thought to be clinically significant at this time.  UA does not suggest infection.  Chest x-ray shows improvement in infiltrates  Atrial fibrillation with RVR Patient was on diltiazem infusion.  Continue to monitor on telemetry.  IV metoprolol.  Currently unable to take orally.  On therapeutic Lovenox for now.  Abnormal LFTs Blood work today shows elevated AST ALT and bilirubin levels.  Alkaline phosphatase is normal.  Previous bilirubin level from a few days ago was normal.  We will fractionate.  Will get right upper quadrant ultrasound.    Hyponatremia Recheck labs.  Thought to be secondary to volume depletion.  Mildly elevated troponin Thought to be secondary to demand ischemia from tachyarrhythmia.  Echocardiogram showed EF to be 50 to 55% without any wall motion abnormality.  Hyperglycemia HbA1c 5.2.  Likely due to steroids.  Seems to be stable.  Continue sliding scale coverage.  Presumptive COPD Quit tobacco about 20 years ago.  Has a 50-pack-year history of smoking.  Continue inhalers.  Goals of care Palliative care is following.  Patient's family currently desires full scope of care.  Prognosis however remains guarded to poor.   DVT Prophylaxis: On therapeutic Lovenox Code  Status: Full code Family Communication: We will update family later today Disposition Plan: Unclear for now.  Will likely need to go to skilled nursing facility  Status is: Inpatient  Remains inpatient appropriate because:Altered mental status, IV treatments appropriate due to intensity of illness or inability to take PO and Inpatient level of care appropriate due to severity of illness   Dispo: The patient is from: Home              Anticipated d/c is to: To be determined              Anticipated d/c date is: 3 days              Patient currently is not medically stable to d/c.   Difficult to place patient No     Medications:  Scheduled: . vitamin C  500 mg Oral Daily  . Chlorhexidine Gluconate Cloth  6 each Topical Daily  . enoxaparin (LOVENOX) injection  80 mg Subcutaneous Q12H  .  folic acid  1 mg Intravenous Daily  . insulin aspart  0-9 Units Subcutaneous Q4H  . mouth rinse  15 mL Mouth Rinse BID  . methylPREDNISolone (SOLU-MEDROL) injection  80 mg Intravenous Q12H  . metoprolol tartrate  2.5 mg Intravenous Q8H  . pantoprazole (PROTONIX) IV  40 mg Intravenous QHS  . sodium chloride flush  3 mL Intravenous Q12H  . sodium chloride flush  3 mL Intravenous Q12H  . thiamine injection  100 mg Intravenous Daily  . zinc sulfate  220 mg Oral Daily   Continuous: . sodium chloride    . dextrose 5 % and 0.45% NaCl 50 mL/hr at 06/09/20 0300  . sodium chloride     [id number] chloride, acetaminophen **OR** acetaminophen, bisacodyl, chlorpheniramine-HYDROcodone, guaiFENesin-dextromethorphan, morphine injection, ondansetron **OR** ondansetron (ZOFRAN) IV, polyethylene glycol, polyvinyl alcohol, sodium chloride flush   Objective:  Vital Signs  Vitals:   06/09/20 0007 06/09/20 0406 06/09/20 0422 06/09/20 0700  BP: (!) 156/96 (!) 144/78 (!) 147/84 (!) 148/90  Pulse: 100 (!) 116 96 73  Resp: 19 20 13 18   Temp: 97.7 F (36.5 C) 97.9 F (36.6 C) 97.9 F (36.6 C) 98 F (36.7 C)   TempSrc: Oral Axillary Axillary Axillary  SpO2: 93%   93%  Weight:      Height:        Intake/Output Summary (Last 24 hours) at 06/09/2020 1038 Last data filed at 06/09/2020 0853 Gross per 24 hour  Intake 890.17 ml  Output 1275 ml  Net -384.83 ml   Filed Weights   06/04/20 0349 06/08/20 0300 06/08/20 2031  Weight: 76.7 kg 69.1 kg 68.2 kg    General appearance: Poorly responsive. Resp: Mildly tachypneic.  Coarse breath sounds bilateral bases.  No wheezing or rhonchi. Cardio: S1-S2 is irregularly irregular.  No S3-S4.  No rubs murmurs or bruit GI: Abdomen is soft.  Nontender nondistended.  Bowel sounds are present normal.  No masses organomegaly Extremities: No edema.   Neurologic: Poorly responsive.  Noted to be in restraints.   Lab Results:  Data Reviewed: I have personally reviewed following labs and imaging studies  CBC: Recent Labs  Lab 06/03/20 0521 06/04/20 0528 06/05/20 0336 06/06/20 0432 06/07/20 0442  WBC 17.2* 11.1* 12.5* 12.2* 10.9*  NEUTROABS 15.7* 10.1* 11.4* 11.1*  --   HGB 13.1 12.6* 13.2 13.3 14.4  HCT 39.8 36.8* 38.5* 39.8 43.1  MCV 100.5* 98.7 98.7 99.0 99.5  PLT 296 179 190 204 [id number]    Basic Metabolic Panel: Recent Labs  Lab 06/03/20 0521 06/04/20 0528 06/05/20 0336 06/06/20 0432 06/06/20 2358  NA 140 143 146* 148* 147*  K 3.9 3.8 3.6 3.6 3.8  CL 105 109 110 111 112*  CO2 24 25 25 25 26   GLUCOSE 148* 151* 145* 131* 133*  BUN 35* 42* 45* 44* 45*  CREATININE 0.80 0.85 0.84 0.75 0.76  CALCIUM 8.2* 8.0* 8.3* 8.1* 8.1*  MG 2.7* 2.7* 2.6* 2.6*  --   PHOS 4.1 3.0 3.6 3.8  --     GFR: Estimated Creatinine Clearance: 67.5 mL/min (by C-G formula based on SCr of 0.76 mg/dL).  Liver Function Tests: Recent Labs  Lab 06/03/20 0521 06/04/20 0528 06/05/20 0336 06/06/20 0432  AST 25 23 26 31   ALT 37 30 32 43  ALKPHOS 112 110 108 94  BILITOT 0.6 0.5 1.1 1.4*  PROT 6.3* 5.6* 6.2* 6.1*  ALBUMIN 2.6* 2.4* 2.8* 2.9*     Coagulation  Profile: Recent Labs  Lab 06/06/20 2358  INR 1.5*     HbA1C: Recent Labs    06/07/20 0442  HGBA1C 5.2    CBG: Recent Labs  Lab 06/08/20 1607 06/08/20 2056 06/09/20 0011 06/09/20 0414 06/09/20 0732  GLUCAP 115* 133* 108* 139* 112*    Lipid Profile: Recent Labs    06/07/20 0442  CHOL 224*  HDL 45  LDLCALC 147*  TRIG 161*  CHOLHDL 5.0      Recent Results (from the past 240 hour(s))  Blood culture (routine single)     Status: None   Collection Time: 06/01/20 12:15 PM   Specimen: Right Antecubital; Blood  Result Value Ref Range Status   Specimen Description   Final    RIGHT ANTECUBITAL BOTTLES DRAWN AEROBIC AND ANAEROBIC   Special Requests Blood Culture adequate volume  Final   Culture   Final    NO GROWTH 5 DAYS Performed at [facility], [address], [place], [place] [address]    Report Status 06/06/2020 FINAL  Final  Urine culture     Status: None   Collection Time: 06/01/20 12:15 PM   Specimen: In/Out Cath Urine  Result Value Ref Range Status   Specimen Description   Final    IN/OUT CATH URINE Performed at [facility], [address], [place], [place] [address]    Special Requests   Final    NONE Performed at [facility], [address] [place], [place] [address]    Culture   Final    NO GROWTH Performed at [facility], [place] [address], [place], [place] [address]    Report Status 06/03/2020 FINAL  Final  Blood Culture (routine x 2)     Status: None   Collection Time: 06/01/20 12:30 PM   Specimen: BLOOD RIGHT FOREARM  Result Value Ref Range Status   Specimen Description   Final    BLOOD RIGHT FOREARM BOTTLES DRAWN AEROBIC AND ANAEROBIC   Special Requests Blood Culture adequate volume  Final   Culture   Final    NO GROWTH 5 DAYS Performed at [facility], [address], [place], [place] [address]    Report Status 06/06/2020 FINAL  Final  MRSA PCR Screening     Status: None   Collection Time: 06/01/20 12:40 PM   Specimen:  Nasopharyngeal  Result Value Ref Range Status   MRSA by PCR NEGATIVE NEGATIVE Final    Comment:        The GeneXpert MRSA Assay (FDA approved for NASAL specimens only), is one component of a comprehensive MRSA colonization surveillance program. It is not intended to diagnose MRSA infection nor to guide or monitor treatment for MRSA infections. Performed at [facility], [address], [place], [place] [address]   MRSA PCR Screening     Status: None   Collection Time: 06/02/20  2:01 PM   Specimen: Nasopharyngeal  Result Value Ref Range Status   MRSA by PCR NEGATIVE NEGATIVE Final    Comment:        The GeneXpert MRSA Assay (FDA approved for NASAL specimens only), is one component of a comprehensive MRSA colonization surveillance program. It is not intended to diagnose MRSA infection nor to guide or monitor treatment for MRSA infections. Performed at [facility], [address] [place], [place] [address]       Radiology Studies: CT HEAD WO CONTRAST  Result Date: 06/08/2020 CLINICAL DATA:  Right MCA infarct EXAM: CT HEAD WITHOUT CONTRAST TECHNIQUE: Contiguous axial images were obtained from the base of  the skull through the vertex without intravenous contrast. COMPARISON:  06/06/2020 FINDINGS: Brain: Stable appearance of the right MCA territory infarct seen previously. No significant change in the size of the infarct territory. No evidence of hemorrhagic transformation. No significant mass effect or midline shift. The lateral ventricles and midline structures are unremarkable. No acute extra-axial fluid collections. Vascular: The hyperdense right M2 branch of the MCA seen previously is less pronounced on this exam. Skull: Normal. Negative for fracture or focal lesion. Sinuses/Orbits: Fluid within the bilateral maxillary sinuses. Mucosal thickening right ethmoid air cells. Other: None. IMPRESSION: 1. Stable size of the right MCA infarct seen previously. No evidence of hemorrhagic  transformation. Electronically Signed   By: Randa Ngo M.D.   On: 06/08/2020 23:05   US Carotid Bilateral (at [facility] and [facility] only)  Result Date: 06/07/2020 CLINICAL DATA:  Altered mental status, left-sided weakness, right MCA territory infarct by CT EXAM: BILATERAL CAROTID DUPLEX ULTRASOUND TECHNIQUE: Pearline Cables scale imaging, color Doppler and duplex ultrasound were performed of bilateral carotid and vertebral arteries in the neck. COMPARISON:  06/06/2020 FINDINGS: Criteria: Quantification of carotid stenosis is based on velocity parameters that correlate the residual internal carotid diameter with NASCET-based stenosis levels, using the diameter of the distal internal carotid lumen as the denominator for stenosis measurement. The following velocity measurements were obtained: RIGHT ICA: 72/11 cm/sec CCA: 0000000 cm/sec SYSTOLIC ICA/CCA RATIO:  0.9 ECA: 102 cm/sec LEFT ICA: 60/14 cm/sec CCA: 123456 cm/sec SYSTOLIC ICA/CCA RATIO:  0.8 ECA: 76 cm/sec RIGHT CAROTID ARTERY: Minor echogenic shadowing plaque formation. No hemodynamically significant right ICA stenosis, velocity elevation, or turbulent flow. Degree of narrowing less than 50%. RIGHT VERTEBRAL ARTERY:  Normal antegrade flow LEFT CAROTID ARTERY: Similar scattered minor plaque formation. No hemodynamically significant left ICA stenosis, velocity elevation, or turbulent flow. LEFT VERTEBRAL ARTERY:  Normal antegrade flow IMPRESSION: Minor carotid atherosclerosis. No hemodynamically significant ICA stenosis. Degree of narrowing less than 50% bilaterally by ultrasound criteria. Patent antegrade vertebral flow bilaterally Electronically Signed   By: Jerilynn Mages.  Shick M.D.   On: 06/07/2020 12:05       LOS: 8 days   [facility] Pager on [email or web address]  06/09/2020, 10:38 AM

## 2020-06-09 NOTE — Progress Notes (Signed)
Pt arrived on unit around 1930. Pt was on [facility]. Pt was able to respond to name but unable to speak. Pt was able to nod no that he was not in pain. Vitals WNL. Bath done. Oral care done.  Pt belongings at bedside: clothes, shoes, partial denture plate, two rings in patient belonging bag.   Wife updated via phone.

## 2020-06-09 NOTE — Evaluation (Signed)
Speech Language Pathology Evaluation Patient Details Name: Joshua Robinson MRN: [id number] DOB: 13-Jan-1935 Today's Date: 06/09/2020 Time: 1120-1140 SLP Time Calculation (min) (ACUTE ONLY): 20 min  Problem List:  Patient Active Problem List   Diagnosis Date Noted  . Atrial fibrillation with RVR ([place]) 06/02/2020  . Acute respiratory disease due to COVID-19 virus 06/01/2020  . Acute respiratory failure with hypoxia Due to Covid PNA 06/01/2020  . Pneumonia due to COVID-19 virus 06/01/2020  . Postoperative anemia due to acute blood loss 03/02/2013  . Hyponatremia 03/02/2013  . OA (osteoarthritis) of knee 02/19/2013   Past Medical History:  Past Medical History:  Diagnosis Date  . Arthritis   . Cancer [facility])    skin   Past Surgical History:  Past Surgical History:  Procedure Laterality Date  . CATARACT EXTRACTION Right   . FOOT SURGERY Bilateral 86 years old   "painful flat foot surgery"  . SKIN CANCER EXCISION Right    cheek  . TOTAL KNEE ARTHROPLASTY Right 02/19/2013   Procedure: RIGHT TOTAL KNEE ARTHROPLASTY;  Surgeon: Gearlean Alf, MD;  Location: [facility] ORS;  Service: Orthopedics;  Laterality: Right;   HPI:  86 year old male with no documented chronic medical problems, but is a reformed smoker of 50 pack years presenting with myalgias, coughing, and shortness of breath that began on 05/24/2020.  The patient states that he visited his PCP on a virtual visit on 05/27/2019.  He was given oral steroids and azithromycin.  His symptoms did not improve.  He continued to have worsening shortness of breath, coughing.  He did have some intermittent chest discomfort with coughing but denied any hemoptysis.  He had some loose stools but did not have any hematochezia or melena.  He also had subjective fevers and chills at home.  He has had 2 doses of the Moderna vaccination but has not been boosted.  EMS was activated and the patient was noted to have oxygen saturation 67% on room air.  He was  placed on CPAP and brought to the hospital.  In the emergency department, the patient was subsequently placed on high flow nasal cannula.  He did not tolerate it well and was placed on BiPAP.  He has been subsequently transitioned to heated high flow at 40 L. CT showed Evolving cytotoxic edema involving the right insula and overlying right frontal lobe, consistent with acute to early subacute right  MCA distribution infarct.. Pt is tolerating 10 L HFNC today. BSE requested   Assessment / Plan / Recommendation Clinical Impression  Pt demonstrates a severe dysarthria complicated by dry oral mucosa after prolonged NPO status. Pt somewhat intelligible after oral care, able to state his name, family members names and verbalized "i knew i had COVID, i didnt know stroke." Pt followed one step commands to the best of his abilty given severity of weakness. He was able to focus gaze on objects at midline and slightly left of midline with verbal cues. Recommend ongoing acute interventions, would likely need SNF level care at d/c.    SLP Assessment  SLP Recommendation/Assessment: Patient needs continued Speech Lanaguage Pathology Services SLP Visit Diagnosis: Dysarthria and anarthria (R47.1)    Follow Up Recommendations  Skilled Nursing facility    Frequency and Duration min 2x/week  2 weeks      SLP Evaluation Cognition  Overall Cognitive Status: Impaired/Different from baseline Arousal/Alertness: Awake/alert Orientation Level: Oriented to person;Oriented to place;Disoriented to time;Disoriented to situation Attention: Focused;Sustained Focused Attention: Appears intact Sustained Attention: Impaired Sustained Attention  Impairment: Verbal complex;Functional complex Memory:  (NT due to severity of dysarthria) Awareness: Impaired Awareness Impairment: Emergent impairment;Intellectual impairment (of physical deficits)       Comprehension  Auditory Comprehension Overall Auditory Comprehension:  Appears within functional limits for tasks assessed Yes/No Questions: Within Functional Limits Commands: Within Functional Limits Reading Comprehension Reading Status: Not tested    Expression Verbal Expression Overall Verbal Expression: Other (comment) (difficult to assess due to dysarthria and dry oral mucosa)   Oral / Motor  Oral Motor/Sensory Function Overall Oral Motor/Sensory Function: Severe impairment Facial ROM: Reduced left;Suspected CN VII (facial) dysfunction Facial Symmetry: Abnormal symmetry left;Suspected CN VII (facial) dysfunction Facial Strength: Reduced left;Suspected CN VII (facial) dysfunction Lingual ROM: Reduced left;Suspected CN XII (hypoglossal) dysfunction Lingual Symmetry: Abnormal symmetry left;Suspected CN XII (hypoglossal) dysfunction Lingual Strength: Reduced;Suspected CN XII (hypoglossal) dysfunction Motor Speech Overall Motor Speech: Impaired Respiration: Within functional limits Phonation: Low vocal intensity Resonance: Within functional limits Articulation: Impaired Level of Impairment: Word Intelligibility: Intelligibility reduced Word: 25-49% accurate Phrase: 25-49% accurate Sentence: Not tested Conversation: Not tested Motor Planning: Witnin functional limits   GO                    Seidy Labreck, Katherene Ponto 06/09/2020, 2:57 PM

## 2020-06-09 NOTE — Progress Notes (Signed)
Nutrition Follow-up  DOCUMENTATION CODES:   Not applicable  INTERVENTION:   Initiate Osmolite 1.5 cal @ 20 ml/hr via Cortrak and increase by 10 ml every 6 hours to goal rate of 50 ml/hr.   45 ml Prosource TF BID per tube.    Tube feeding regimen provides 1880 kcal (100% of needs), 97 grams of protein, and 912 ml of H2O.   NUTRITION DIAGNOSIS:   Inadequate oral intake related to acute illness (acute respiratory failure with hypoxia due to Covid PNA) as evidenced by NPO status; ongoing  GOAL:   Patient will meet greater than or equal to 90% of their needs; to be met with TF  MONITOR:   TF tolerance,Diet advancement,Skin,Weight trends,Labs,I & O's  REASON FOR ASSESSMENT:   NPO/Clear Liquid Diet    ASSESSMENT:   86 year old male admitted with acute respiratory failure with hypoxia secondary to pneumonia due to Covid-19 virus. Pt is a reformed smoker without any significant past medical history presented with one week history of significant dyspnea and productive cough with green sputum. Pt with acute embolic right MCA stroke/acute metabolic encephalopathy.  Pt is currently on 3 L HFNC. Pt NPO over the past 8 days. Per SLP, pt continues to not be ready for PO/diet advancement with recommendations of nutrition via alternative means. Plans for Cortrak NGT placement today. If Cortrak unable to be placed today, RN agreeable to placing NG at bedside. RD to order enteral nutrition with plans for initiation today. Verbal consent for tube feeding initiation and management given via MD.   Labs and medications reviewed.   Diet Order:   Diet Order            Diet NPO time specified  Diet effective now                 EDUCATION NEEDS:    No education needs have been identified at this time  Skin:  Skin Assessment: Reviewed RN Assessment  Last BM:  1/20  Height:   Ht Readings from Last 1 Encounters:  06/08/20 5' 8"  (1.727 m)    Weight:   Wt Readings from Last 1  Encounters:  06/08/20 68.2 kg    BMI:  Body mass index is 22.86 kg/m.  Estimated Nutritional Needs:   Kcal:  1850-2050  Protein:  85-100 grams  Fluid:  >/= 1.8 L/day  Corrin [facility], MS, RD, LDN RD pager number/after hours weekend pager number on Amion.

## 2020-06-09 NOTE — Progress Notes (Signed)
Occupational Therapy Evaluation Patient Details Name: Joshua Robinson MRN: [id number] DOB: 02-16-1935 Today's Date: 06/09/2020    History of Present Illness 86 year old male with no documented chronic medical problems, but is a reformed smoker of 50 pack years presenting with myalgias, coughing, and shortness of breath that began on 05/24/2020.  The patient states that he visited his PCP on a virtual visit on 05/27/2019.  He was given oral steroids and azithromycin.  His symptoms did not improve.  He continued to have worsening shortness of breath, coughing.  He did have some intermittent chest discomfort with coughing but denied any hemoptysis.  He had some loose stools but did not have any hematochezia or melena.  He also had subjective fevers and chills at home.  He has had 2 doses of the Moderna vaccination but has not been boosted.  EMS was activated and the patient was noted to have oxygen saturation 67% on room air.  He was placed on CPAP and brought to the hospital.  In the emergency department, the patient was subsequently placed on high flow nasal cannula.  He did not tolerate it well and was placed on BiPAP.  He has been subsequently transitioned to heated high flow at 40 L.  The patient was given Actemra and started on intravenous steroids.  In addition, the patient was noted to have atrial fibrillation with RVR.  He was started on a diltiazem drip. New onset of LUE/LLE hemiplegia due to recent right MCA sroke   Clinical Impression   PTA pt lives with his wife and was very independent. Pt requires total A for bed mobility and total A for most ADL tasks due to below listed deficits. Will need +2 A/lift equipment for any mobility. Recommend rehab at SNF. Wife called during session so pt could hear her voice. Pt was able to tell his wife he loved her and was appropriately emotional. Wife very appreciative. Pt left in chair position at bed level. Will follow acutely.     Follow Up  Recommendations  SNF;Supervision/Assistance - 24 hour    Equipment Recommendations  3 in 1 bedside commode    Recommendations for Other Services Other (comment) (Palliative)     Precautions / Restrictions Precautions Precautions: Fall Precaution Comments: fragile skin      Mobility Bed Mobility Overal bed mobility: Needs Assistance   Rolling: Total assist         General bed mobility comments: pt reaches for therapist - do not feel he could see rail; attempted all movements    Transfers                 General transfer comment: will need +2; lift equipment    Balance Overall balance assessment: Needs assistance   Sitting balance-Leahy Scale: Zero Sitting balance - Comments: bed level                                   ADL either performed or assessed with clinical judgement   ADL Overall ADL's : Needs assistance/impaired     Grooming: Moderate assistance Grooming Details (indicate cue type and reason): ablet o wash face; mouth very dry; orders for feeding tube/NG Upper Body Bathing: Maximal assistance   Lower Body Bathing: Total assistance   Upper Body Dressing : Total assistance   Lower Body Dressing: Total assistance               Functional mobility  during ADLs:  (will need +2 assistance)       Vision Baseline Vision/History: Wears glasses Wears Glasses: At all times Vision Assessment?: Vision impaired- to be further tested in functional context Additional Comments: L field cut; unable to find glasses     Perception Perception Perception Tested?: Yes Perception Deficits: Inattention/neglect Comments: will further assess   Praxis      Pertinent Vitals/Pain Pain Assessment: Faces Faces Pain Scale: No hurt     Hand Dominance Right   Extremity/Trunk Assessment Upper Extremity Assessment Upper Extremity Assessment: LUE deficits/detail LUE Deficits / Details: flaccid LUE Sensation: decreased light touch;decreased  proprioception LUE Coordination: decreased fine motor;decreased gross motor (nonfunctional)   Lower Extremity Assessment Lower Extremity Assessment: Defer to PT evaluation   Cervical / Trunk Assessment Cervical / Trunk Assessment: Other exceptions Cervical / Trunk Exceptions: L lateral lean   Communication Communication Communication: Expressive difficulties (dysarthric; dificult to understand)   Cognition Arousal/Alertness: Awake/alert Behavior During Therapy: Flat affect Overall Cognitive Status: Difficult to assess Area of Impairment: Orientation;Attention;Safety/judgement;Awareness;Problem solving                 Orientation Level: Disoriented to;Time Current Attention Level: Sustained     Safety/Judgement: Decreased awareness of safety;Decreased awareness of deficits Awareness: Emergent Problem Solving: Slow processing;Decreased initiation;Difficulty sequencing;Requires verbal cues;Requires tactile cues General Comments: did well with following commands; appeasr to have L field cut? L neglect   General Comments       Exercises Exercises: Other exercises Other Exercises Other Exercises: Pt picked up his incentive spirometer adn was trying to complete his exericses on his own. Was only able to pull @ 50 ml   Shoulder Instructions      Home Living Family/patient expects to be discharged to:: Skilled nursing facility                                        Prior Functioning/Environment Level of Independence: Independent        Comments: very independent; drove        OT Problem List: Decreased strength;Decreased range of motion;Decreased activity tolerance;Impaired vision/perception;Decreased coordination;Impaired balance (sitting and/or standing);Decreased cognition;Decreased safety awareness;Decreased knowledge of use of DME or AE;Decreased knowledge of precautions;Cardiopulmonary status limiting activity;Impaired sensation;Impaired  tone;Impaired UE functional use      OT Treatment/Interventions: Self-care/ADL training;Therapeutic exercise;Neuromuscular education;Energy conservation;DME and/or AE instruction;Splinting;Therapeutic activities;Cognitive remediation/compensation;Visual/perceptual remediation/compensation;Patient/family education;Balance training    OT Goals(Current goals can be found in the care plan section) Acute Rehab OT Goals Patient Stated Goal: Per wife for pt to get stronger and to be able to bring him home OT Goal Formulation: With patient/family Time For Goal Achievement: 06/23/20 Potential to Achieve Goals: Fair  OT Frequency: Min 2X/week   Barriers to D/C:            Co-evaluation              AM-PAC OT "6 Clicks" Daily Activity     Outcome Measure Help from another person eating meals?: Total Help from another person taking care of personal grooming?: A Lot Help from another person toileting, which includes using toliet, bedpan, or urinal?: Total Help from another person bathing (including washing, rinsing, drying)?: Total Help from another person to put on and taking off regular upper body clothing?: Total Help from another person to put on and taking off regular lower body clothing?: Total 6 Click Score: 7  End of Session Nurse Communication: Mobility status  Activity Tolerance: Patient limited by fatigue Patient left: in bed;with call bell/phone within reach;with bed alarm set  OT Visit Diagnosis: Unsteadiness on feet (R26.81);Other abnormalities of gait and mobility (R26.89);Muscle weakness (generalized) (M62.81);Low vision, both eyes (H54.2);Other symptoms and signs involving cognitive function;Hemiplegia and hemiparesis Hemiplegia - Right/Left: Left Hemiplegia - dominant/non-dominant: Non-Dominant Hemiplegia - caused by: Cerebral infarction                [id number] OT Time Calculation (min): 24 min Charges:  OT General Charges $OT Visit: 1 Visit OT  Evaluation $OT Eval Moderate Complexity: 1 Mod OT Treatments $Self Care/Home Management : 8-22 mins  Maurie Boettcher, OT/L   Acute OT Clinical Specialist Acute Rehabilitation Services Pager [phone] Office [phone]   [facility] 06/09/2020, 7:48 PM

## 2020-06-09 NOTE — Consult Note (Signed)
Neurology Consultation Reason for Consult: new onset left facial droop, aphasia, and left-sided hemiparesis Requesting Physician: Dr. [place] Pink  CC: left-sided hemiparesis, dysarthria, and left facial droop  History is obtained from: chart review  HPI: Joshua Robinson is a 86 y.o. male with no documented medical problems prior to current admission besides a 50-pack-year smoking history. He presented to [facility] 1/16 with myalgias, coughing, fevers, chills, and shortness of breath that began on 05/24/2020 without improvement with outpatient steroids and azithromycin. EMS was activated and he was found to have a room air oxygen saturation of 67% and was noted to have atrial fibrillation with RVR. During his hospitalization, he was found to have COVID-19, required BiPAP for oxygenation, and required a diltiazem drip for atrial fibrillation with RVR. On 06/06/2020 into early morning 06/07/2020, he was found to have acute onset left-sided hemiparesis, left facial droop, and aphasia. He was activated as a code stroke and CT imaging found a right MCA distribution infarct, a hyperdense vessel at the base of the right sylvian fissure, possibly reflecting a proximal M2 thrombus, and underlying atrophy with chronic small vessel ischemic disease and was then transferred to [facility] on 06/08/20 for further management.   Premorbid modified rankin scale:     0 - No symptoms.      ROS: Unable to obtain due to altered mental status.   Past Medical History:  Diagnosis Date  . Arthritis   . Cancer ([place])    skin    Family History  Problem Relation Age of Onset  . Diabetes Mother   . Hypertension Father    Social History:  reports that he quit smoking about 24 years ago. His smoking use included cigarettes. He quit after 40.00 years of use. His smokeless tobacco use includes snuff. He reports that he does not drink alcohol and does not use drugs.  Exam: Current vital signs: BP (!) 153/78 (BP Location: Left  Arm)   Pulse 90   Temp 98.1 F (36.7 C) (Axillary)   Resp 18   Ht 5' 8"  (1.727 m)   Wt 68.2 kg   SpO2 91%   BMI 22.86 kg/m  Vital signs in last 24 hours: Temp:  [97.7 F (36.5 C)-98.24 F (36.8 C)] 98.1 F (36.7 C) (01/24 1100) Pulse Rate:  [73-116] 90 (01/24 1500) Resp:  [13-27] 18 (01/24 1500) BP: (144-169)/(78-96) 153/78 (01/24 1500) SpO2:  [91 %-95 %] 91 % (01/24 1100) Weight:  [68.2 kg] 68.2 kg (01/23 2031)  Physical Exam  Constitutional: Appears frail, drowsy, laying in bed, in no acute distress.  Psych: Becomes frustrated with limited conversational ability.  Eyes: Normal conjunctiva HENT: No OP obstrucion MSK: No joint deformities.  Cardiovascular: Irregular rate and rhythm on cardiac monitor Respiratory: Tachypnea noted, some audible congestion heard, weak cough GI: Soft.  No distension. There is no tenderness.  Skin: small dried scab on bridge of nose; from BiPAP per chart review. No active hemorrhage.   Neuro: Mental Status: Patient is awake, alert, oriented to person, age, month, and place. Patient is unable to give a clear and coherent history due to lethargy, slow to respond. Appears frustrated with deficits from stroke- altered speech and left-sided hemiparesis. Speech is dysarthric, aphasic. He speaks with one word communication with delayed responses.  Does appear to have some neglect to the left (grabs stethoscope near the right side instead of in the middle, much slower to orient to stimuli on the left though he will orient to stimuli in the  left, cannot direct gaze towards the left) Cranial Nerves: II: Visual fields are full. Blink to threat present. Pupils are equal, round, and reactive to light 3 mm/brisk. III,IV, VI: Patient able to look fully downward and towards the right, only slightly crosses midline towards the left. Conservation officer, nature. No ptosis or diplopia.  V: Facial sensation is symmetric to light touch VII: Facial movement is asymmetric with  left-sided mouth palsy.  VIII: Hearing is intact to voice X: Palate elevates symmetrically XI: Shoulder shrug asymmetric, stronger on the right.  XII: Tongue appears to protrude midline but limited due to patient weakness and mouth dryness; only partial protrusion Motor: Tone is normal. Bulk is normal. Right upper and lower extremities 4/5 strength without pronator drift after 3 tries and coaching. Left upper extremity fairly nearly flaccid, trace movement, left lower extremity flaccid,  Sensory: Sensation is symmetric to light touch and temperature in the arms and legs.   Deep Tendon Reflexes: 2+ and symmetric in the biceps and patellae.  Plantars: Toes are downgoing bilaterally.  Cerebellar: Unable to assess second to patient weakness.  NIHSS total  1a Level of Conscious.: 1 1b LOC Questions: 0 1c LOC Commands: 0 2 Best Gaze: 1 3 Visual: 0 4 Facial Palsy: 2 5a Motor Arm - left: 4 5b Motor Arm - Right: 0 6a Motor Leg - Left: 4 6b Motor Leg - Right: 0 7 Limb Ataxia: 0 8 Sensory: 0 9 Best Language: 2 10 Dysarthria: 1 11 Extinct. and Inatten.: 0 TOTAL: 15  I have reviewed labs in epic and the results pertinent to this consultation are: CBC    Component Value Date/Time   WBC 10.6 (H) 06/09/2020 1045   RBC 4.77 06/09/2020 1045   HGB 16.0 06/09/2020 1045   HCT 45.6 06/09/2020 1045   PLT 192 06/09/2020 1045   MCV 95.6 06/09/2020 1045   MCH 33.5 06/09/2020 1045   MCHC 35.1 06/09/2020 1045   RDW 13.0 06/09/2020 1045   LYMPHSABS 0.5 (L) 06/06/2020 0432   MONOABS 0.5 06/06/2020 0432   EOSABS 0.0 06/06/2020 0432   BASOSABS 0.0 06/06/2020 0432   CMP     Component Value Date/Time   NA 145 06/09/2020 1045   K 4.2 06/09/2020 1045   CL 111 06/09/2020 1045   CO2 23 06/09/2020 1045   GLUCOSE 129 (H) 06/09/2020 1045   BUN 40 (H) 06/09/2020 1045   CREATININE 0.64 06/09/2020 1045   CALCIUM 7.9 (L) 06/09/2020 1045   PROT 5.5 (L) 06/09/2020 1045   ALBUMIN 2.8 (L) 06/09/2020  1045   AST 121 (H) 06/09/2020 1045   ALT 327 (H) 06/09/2020 1045   ALKPHOS 75 06/09/2020 1045   BILITOT 7.0 (H) 06/09/2020 1314   GFRNONAA >60 06/09/2020 1045   GFRAA >90 02/21/2013 0400   I have reviewed the images obtained: CT head 06/06/2020 IMPRESSION: 1. Evolving cytotoxic edema involving the right insula and overlying right frontal lobe, consistent with acute to early subacute right MCA distribution infarct. No hemorrhage. 2. ASPECTS is 7. 3. Question hyperdense vessel at the base of the right sylvian fissure, possibly reflecting thrombus within a proximal right M2 branch. 4. Underlying atrophy with chronic small vessel ischemic disease.  CT angio head and neck IMPRESSION: 1. Acute right M2 branch occlusion, superior division, in keeping with the previously identified acute to early subacute right MCA distribution infarct. 2. Otherwise wide patency of the major arterial vasculature of the head and neck. 3. Extensive ground-glass opacity throughout the visualized lungs, right  worse than left, concerning for severe multifocal pneumonia.  Impression: 86 year old male without documented medical history but with a 50-pack-year smoking history who presented to [facility] with shortness of breath, malaise, and atrial fibrillation with RVR. Patient was found on 06/07/2020 in the evening with acute onset left-sided hemiparesis, aphasia, and left facial droop prompting stat CT imaging. CT revealed an acute/subacute right MCA infarct from an M2 occlusion and was transferred to [facility] on 06/08/20 for further management. M2 occlusion likely cardioembolic in nature in the setting of atrial fibrillation superimposed on hypercoagulability from COVID-19 infection.  Suspect his waxing and waning's mental status is secondary to delirium from acute infection/stroke, but will obtain EEG to confirm.  Note he is approaching the peak swelling.  (3 to 5 days post initial stroke), but given his age and general  brain atrophy suspect he will not have significant complications from the swelling  Recommendations: # Right MCA superior division stroke, likely cardioembolic in the setting of known Afib and hypercoagulability from acute infection (COVID19) - Stroke labs TSH, ESR, RPR, HgbA1c, fasting lipid panel - MRI brain  - MRA of the brain without contrast and MRA neck w/wo or CTA  - Frequent neuro checks, every 4 hours  -Repeat head CT for significant changes in neurological examination including new agitation - Echocardiogram - Normonatremia, avoid hypotonic fluids, using normal saline preferentially for any fluid resuscitation that is needed - Prophylactic therapy-Antiplatelet med: Aspirin - dose 335m daily until [facility].  - Stability HCT in 10 days, then consider restarting AC if stable - Risk factor modification - Telemetry monitoring; 30 day event monitor on discharge if no arrythmias captured  - Blood pressure goal   - Gradually normalize over 3-5 days, goal normotension - PT consult, OT consult, Speech consult, unless patient is back to baseline - EEG given AMS to eval for seizures - Stroke team to follow  [phone]

## 2020-06-09 NOTE — Procedures (Signed)
Patient Name: Joshua Robinson  MRN: [id number]  Epilepsy Attending: Lora Havens  Referring Physician/Provider: Dr Lesleigh Noe Date: 06/09/2020 Duration: 26.29 mins  Patient history: 86yo M with AMS. EEG to evaluate for seizure  Level of alertness: Awake  AEDs during EEG study: None  Technical aspects: This EEG study was done with scalp electrodes positioned according to the 10-20 International system of electrode placement. Electrical activity was acquired at a sampling rate of 500Hz  and reviewed with a high frequency filter of 70Hz  and a low frequency filter of 1Hz . EEG data were recorded continuously and digitally stored.   Description: No posterior dominant rhythm was seen. EEG showed continuous generalized 3 to 6 Hz theta-delta slowing. Hyperventilation and photic stimulation were not performed.     ABNORMALITY -Continuous slow, generalized  IMPRESSION: This study is suggestive of moderate diffuse encephalopathy, nonspecific etiology. No seizures or epileptiform discharges were seen throughout the recording.  Joshua Robinson

## 2020-06-10 ENCOUNTER — Inpatient Hospital Stay (HOSPITAL_COMMUNITY): Payer: Medicare Other

## 2020-06-10 DIAGNOSIS — I639 Cerebral infarction, unspecified: Secondary | ICD-10-CM | POA: Diagnosis not present

## 2020-06-10 DIAGNOSIS — I63411 Cerebral infarction due to embolism of right middle cerebral artery: Secondary | ICD-10-CM

## 2020-06-10 DIAGNOSIS — I4891 Unspecified atrial fibrillation: Secondary | ICD-10-CM | POA: Diagnosis not present

## 2020-06-10 DIAGNOSIS — U071 COVID-19: Secondary | ICD-10-CM | POA: Diagnosis not present

## 2020-06-10 DIAGNOSIS — J9601 Acute respiratory failure with hypoxia: Secondary | ICD-10-CM | POA: Diagnosis not present

## 2020-06-10 LAB — COMPREHENSIVE METABOLIC PANEL
ALT: 351 U/L — ABNORMAL HIGH (ref 0–44)
AST: 121 U/L — ABNORMAL HIGH (ref 15–41)
Albumin: 2.7 g/dL — ABNORMAL LOW (ref 3.5–5.0)
Alkaline Phosphatase: 73 U/L (ref 38–126)
Anion gap: 9 (ref 5–15)
BUN: 41 mg/dL — ABNORMAL HIGH (ref 8–23)
CO2: 22 mmol/L (ref 22–32)
Calcium: 7.8 mg/dL — ABNORMAL LOW (ref 8.9–10.3)
Chloride: 111 mmol/L (ref 98–111)
Creatinine, Ser: 0.73 mg/dL (ref 0.61–1.24)
GFR, Estimated: >60 mL/min (ref >60)
Glucose, Bld: 153 mg/dL — ABNORMAL HIGH (ref 70–99)
Potassium: 4.2 mmol/L (ref 3.5–5.1)
Sodium: 142 mmol/L (ref 135–145)
Total Bilirubin: 6.6 mg/dL — ABNORMAL HIGH (ref 0.3–1.2)
Total Protein: 5.4 g/dL — ABNORMAL LOW (ref 6.5–8.1)

## 2020-06-10 LAB — GLUCOSE, CAPILLARY
Glucose-Capillary: 117 mg/dL — ABNORMAL HIGH (ref 70–99)
Glucose-Capillary: 119 mg/dL — ABNORMAL HIGH (ref 70–99)
Glucose-Capillary: 120 mg/dL — ABNORMAL HIGH (ref 70–99)
Glucose-Capillary: 131 mg/dL — ABNORMAL HIGH (ref 70–99)
Glucose-Capillary: 141 mg/dL — ABNORMAL HIGH (ref 70–99)
Glucose-Capillary: 145 mg/dL — ABNORMAL HIGH (ref 70–99)
Glucose-Capillary: 148 mg/dL — ABNORMAL HIGH (ref 70–99)

## 2020-06-10 LAB — CBC
HCT: 45.7 % (ref 39.0–52.0)
Hemoglobin: 16.2 g/dL (ref 13.0–17.0)
MCH: 33.9 pg (ref 26.0–34.0)
MCHC: 35.4 g/dL (ref 30.0–36.0)
MCV: 95.6 fL (ref 80.0–100.0)
Platelets: 196 10*3/uL (ref 150–400)
RBC: 4.78 MIL/uL (ref 4.22–5.81)
RDW: 13.1 % (ref 11.5–15.5)
WBC: 14 10*3/uL — ABNORMAL HIGH (ref 4.0–10.5)
nRBC: 0 % (ref 0.0–0.2)

## 2020-06-10 LAB — HEPARIN LEVEL (UNFRACTIONATED): Heparin Unfractionated: 0.57 IU/mL (ref 0.30–0.70)

## 2020-06-10 MED ORDER — HEPARIN (PORCINE) 25000 UT/250ML-% IV SOLN
700.0000 [IU]/h | INTRAVENOUS | Status: AC
  Administered 2020-06-10: 950 [IU]/h via INTRAVENOUS
  Administered 2020-06-10: 850 [IU]/h via INTRAVENOUS
  Filled 2020-06-10 (×3): qty 250

## 2020-06-10 MED ORDER — METOPROLOL TARTRATE 5 MG/5ML IV SOLN
2.5000 mg | Freq: Once | INTRAVENOUS | Status: AC
  Administered 2020-06-10: 2.5 mg via INTRAVENOUS
  Filled 2020-06-10: qty 5

## 2020-06-10 NOTE — Progress Notes (Signed)
PROGRESS NOTE  Joshua Robinson [id number] DOB: 04/24/1935 DOA: 06/01/2020  PCP: Redmond School, MD  Brief History/Interval Summary: 86 year old male with no documented chronic medical problems, but is a reformed smoker of 50 pack years presenting with myalgias, coughing, and shortness of breath that began on 05/24/2020.The patient states that he visited his PCP on a virtual visit on 05/27/2019. He was given oral steroids and azithromycin. His symptoms did not improve. He continued to have worsening shortness of breath, coughing. He has had 2 doses of the Moderna vaccination but has not been boosted.  Found to be positive for COVID-19. In the emergency department, the patient was subsequently placed on high flow nasal cannula. He did not tolerate it well and was placed on BiPAP. He has been subsequently transitioned to heated high flow.  Hospitalized to the intensive care unit and independent.  Respiratory status started improving however patient then developed embolic stroke.  Subsequently transferred to [facility].  Reason for Visit: Pneumonia due to COVID-19.  Acute respiratory failure with hypoxia.  Acute embolic stroke.  Consultants: Pulmonology.  Neurology  Procedures: None yet  Antibiotics: Anti-infectives (From admission, onward)   Start     Dose/Rate Route Frequency Ordered Stop   06/02/20 1000  cefTRIAXone (ROCEPHIN) 2 g in sodium chloride 0.9 % 100 mL IVPB  Status:  Discontinued        2 g 200 mL/hr over 30 Minutes Intravenous Every 24 hours 06/02/20 0638 06/04/20 1004   06/02/20 1000  azithromycin (ZITHROMAX) 500 mg in sodium chloride 0.9 % 250 mL IVPB  Status:  Discontinued        500 mg 250 mL/hr over 60 Minutes Intravenous Every 24 hours 06/02/20 0638 06/04/20 1004   06/02/20 0600  vancomycin (VANCOREADY) IVPB 750 mg/150 mL  Status:  Discontinued        750 mg 150 mL/hr over 60 Minutes Intravenous Every 12 hours 06/01/20 1333 06/02/20 1136   06/01/20  1245  vancomycin (VANCOREADY) IVPB 1500 mg/300 mL        1,500 mg 150 mL/hr over 120 Minutes Intravenous  Once 06/01/20 1239 06/01/20 1627   06/01/20 1230  cefTRIAXone (ROCEPHIN) 1 g in sodium chloride 0.9 % 100 mL IVPB        1 g 200 mL/hr over 30 Minutes Intravenous  Once 06/01/20 1223 06/01/20 1337   06/01/20 1230  azithromycin (ZITHROMAX) 500 mg in sodium chloride 0.9 % 250 mL IVPB        500 mg 250 mL/hr over 60 Minutes Intravenous  Once 06/01/20 1223 06/01/20 1425      Subjective/Interval History: Patient remains poorly responsive although he does follow certain commands.       Assessment/Plan:  Acute Hypoxic Resp. Failure/Pneumonia due to COVID-19   Recent Labs  Lab 06/04/20 0528 06/05/20 0336 06/06/20 0432 06/09/20 1045 06/10/20 0124  DDIMER >20.00* >20.00* >20.00*  --   --   FERRITIN 408* 549* 549*  --   --   CRP 10.3* 6.6* 3.5*  --   --   ALT 30 32 43 327* 351*    Objective findings: Oxygen requirements: 1 to 2 L of oxygen by nasal cannula saturations noted in the mid 90s.    COVID 19 Therapeutics: Antibacterials: Antibiotics were given in the ED but not continued.  Procalcitonin was 0.19 Remdesivir: It appears that he did not receive Remdesivir Steroids: Remains on Solu-Medrol.  We will start tapering. Diuretics: Not on scheduled diuretics Inhaled Steroids: None noted  Actemra/Baricitinib: Patient was given Actemra PUD Prophylaxis: On Protonix DVT Prophylaxis: On therapeutic Lovenox  Tested positive on 1/16  From a respiratory standpoint patient appears to be improving.  His oxygen requirements have gone down significantly.  Patient was treated with steroids and Actemra.  Did not get Remdesivir. Underwent CT angiogram on 1/17 which was negative for PE.  No need for antibacterials currently.  Start tapering steroids.  Acute embolic right MCA stroke/acute metabolic encephalopathy Patient remains encephalopathic.  He is not moving his left upper or lower  extremity.  He is able to squeeze my fingers with his right hand.  Able to wiggle his right toes.   While he was at [facility] patient was found to have an acute stroke.  Discussions were held with neurology.  Patient was transferred to [facility].   MRI showed right MCA stroke with tiny hemorrhagic transformation.  Discussed with neurology yesterday who recommended discontinuing Lovenox. Stroke service is following.  Discussed with Dr. Erlinda Hong this morning he recommends reinitiating anticoagulation with IV heparin.   Patient remains at very high risk for aspiration.  Speech therapy is following.   Other etiologies for his alteration in mental status have been pursued.  ABG did not show any hypercapnia.  Ammonia level was only minimally elevated.  UA did not show infection.   LDL 147.  Initiate statin once he is able to take orally. HbA1c 5.2. EEG did not show any epileptiform activity  Atrial fibrillation with RVR Initially patient was requiring diltiazem infusion.  Currently on IV metoprolol.  Was on therapeutic Lovenox which was held yesterday due to concern for hemorrhagic transformation.  Discussed with stroke neurologist today.  Okay to resume IV heparin.  Continue to monitor on telemetry.    Oropharyngeal dysphagia Secondary to stroke.  NG tube was inserted yesterday and patient started on tube feedings.  Tube to be advanced by 3 inches.  We will recheck tube placement by x-ray this afternoon.  Abnormal LFTs Patient was found to have transaminitis as well as hyperbilirubinemia.  Fractionated bilirubin showed equal direct and indirect bilirubin.  No anemia noted.  Renal function was normal.  Right upper quadrant ultrasound does show gallstones and gallbladder sludge.  No evidence for acute cholecystitis.  Hepatic steatosis noted.  No biliary ductal dilatation.  Alkaline phosphatase is normal.  Patient is nontender on abdominal examination.  Will check hepatitis panel.  Bilirubin  could be elevated due to acute illness/Covid.  Continue to trend.  Hyponatremia Appears to have resolved.  Possibly due to volume depletion.    Mildly elevated troponin Thought to be secondary to demand ischemia from tachyarrhythmia.  Echocardiogram showed EF to be 50 to 55% without any wall motion abnormality.  Do not anticipate any further inpatient cardiac work-up at this time.  Hyperglycemia HbA1c 5.2.  Likely due to steroids.  Seems to be stable.  Continue sliding scale coverage.  Presumptive COPD Quit tobacco about 20 years ago.  Has a 50-pack-year history of smoking.  Continue inhalers.  Goals of care Palliative care is following.  Patient's family currently desires full scope of care.  Prognosis however remains guarded to poor.   DVT Prophylaxis: On therapeutic Lovenox Code Status: Full code Family Communication: Wife was updated yesterday.  Do so again today. Disposition Plan: Unclear for now.  Will likely need to go to skilled nursing facility  Status is: Inpatient  Remains inpatient appropriate because:Altered mental status, IV treatments appropriate due to intensity of illness or inability  to take PO and Inpatient level of care appropriate due to severity of illness   Dispo: The patient is from: Home              Anticipated d/c is to: To be determined              Anticipated d/c date is: 3 days              Patient currently is not medically stable to d/c.   Difficult to place patient No     Medications:  Scheduled: . Chlorhexidine Gluconate Cloth  6 each Topical Daily  . feeding supplement (PROSource TF)  45 mL Per Tube BID  . folic acid  1 mg Intravenous Daily  . insulin aspart  0-9 Units Subcutaneous Q4H  . mouth rinse  15 mL Mouth Rinse BID  . methylPREDNISolone (SOLU-MEDROL) injection  40 mg Intravenous Q12H  . metoprolol tartrate  2.5 mg Intravenous Q8H  . pantoprazole (PROTONIX) IV  40 mg Intravenous QHS  . sodium chloride flush  3 mL Intravenous  Q12H  . sodium chloride flush  3 mL Intravenous Q12H  . thiamine injection  100 mg Intravenous Daily   Continuous: . sodium chloride    . dextrose 5 % and 0.9% NaCl 50 mL/hr at 06/10/20 0342  . feeding supplement (OSMOLITE 1.5 CAL) 40 mL/hr at 06/10/20 0853  . heparin 950 Units/hr (06/10/20 1106)  . sodium chloride     HYI:FOYDXA chloride, acetaminophen **OR** acetaminophen, bisacodyl, chlorpheniramine-HYDROcodone, guaiFENesin-dextromethorphan, ondansetron **OR** ondansetron (ZOFRAN) IV, polyethylene glycol, polyvinyl alcohol, sodium chloride flush   Objective:  Vital Signs  Vitals:   06/10/20 0002 06/10/20 0402 06/10/20 0723 06/10/20 1152  BP: 120/78 118/66 115/61 125/70  Pulse: (!) 102 93 94 89  Resp: 20 17 18 19   Temp: (!) 97.2 F (36.2 C) 98.6 F (37 C) 98.4 F (36.9 C) 98 F (36.7 C)  TempSrc: Axillary Axillary Oral Oral  SpO2: 92% 99% 95% 98%  Weight:      Height:        Intake/Output Summary (Last 24 hours) at 06/10/2020 1201 Last data filed at 06/10/2020 0756 Gross per 24 hour  Intake 258.97 ml  Output 1050 ml  Net -791.03 ml   Filed Weights   06/04/20 0349 06/08/20 0300 06/08/20 2031  Weight: 76.7 kg 69.1 kg 68.2 kg    General appearance: Minimally responsive.  No distress Resp: Coarse breath sounds bilateral bases.  No wheezing or rhonchi Cardio: S1-S2 is irregularly irregular  GI: Abdomen is soft.  Nontender nondistended.  Bowel sounds are present normal.  No masses organomegaly Extremities: No edema. Neurologic: Awake fully.  Follows some commands.  Distracted.  Left hemiparesis noted.     Lab Results:  Data Reviewed: I have personally reviewed following labs and imaging studies  CBC: Recent Labs  Lab 06/04/20 0528 06/05/20 0336 06/06/20 0432 06/07/20 0442 06/09/20 1045 06/10/20 0124  WBC 11.1* 12.5* 12.2* 10.9* 10.6* 14.0*  NEUTROABS 10.1* 11.4* 11.1*  --   --   --   HGB 12.6* 13.2 13.3 14.4 16.0 16.2  HCT 36.8* 38.5* 39.8 43.1 45.6  45.7  MCV 98.7 98.7 99.0 99.5 95.6 95.6  PLT 179 190 204 231 192 128    Basic Metabolic Panel: Recent Labs  Lab 06/04/20 0528 06/05/20 0336 06/06/20 0432 06/06/20 2358 06/09/20 1045 06/10/20 0124  NA 143 146* 148* 147* 145 142  K 3.8 3.6 3.6 3.8 4.2 4.2  CL 109 110 111 112*  111 111  CO2 25 25 25 26 23 22   GLUCOSE 151* 145* 131* 133* 129* 153*  BUN 42* 45* 44* 45* 40* 41*  CREATININE 0.85 0.84 0.75 0.76 0.64 0.73  CALCIUM 8.0* 8.3* 8.1* 8.1* 7.9* 7.8*  MG 2.7* 2.6* 2.6*  --   --   --   PHOS 3.0 3.6 3.8  --   --   --     GFR: Estimated Creatinine Clearance: 67.5 mL/min (by C-G formula based on SCr of 0.73 mg/dL).  Liver Function Tests: Recent Labs  Lab 06/04/20 0528 06/05/20 0336 06/06/20 0432 06/09/20 1045 06/09/20 1314 06/10/20 0124  AST 23 26 31  121*  --  121*  ALT 30 32 43 327*  --  351*  ALKPHOS 110 108 94 75  --  73  BILITOT 0.5 1.1 1.4* 6.9* 7.0* 6.6*  PROT 5.6* 6.2* 6.1* 5.5*  --  5.4*  ALBUMIN 2.4* 2.8* 2.9* 2.8*  --  2.7*     Coagulation Profile: Recent Labs  Lab 06/06/20 2358  INR 1.5*     CBG: Recent Labs  Lab 06/09/20 2010 06/10/20 0006 06/10/20 0405 06/10/20 0722 06/10/20 1150  GLUCAP 110* 117* 148* 141* 131*     Recent Results (from the past 240 hour(s))  Blood culture (routine single)     Status: None   Collection Time: 06/01/20 12:15 PM   Specimen: Right Antecubital; Blood  Result Value Ref Range Status   Specimen Description   Final    RIGHT ANTECUBITAL BOTTLES DRAWN AEROBIC AND ANAEROBIC   Special Requests Blood Culture adequate volume  Final   Culture   Final    NO GROWTH 5 DAYS Performed at [facility], [address] [place], [place] [address]    Report Status 06/06/2020 FINAL  Final  Urine culture     Status: None   Collection Time: 06/01/20 12:15 PM   Specimen: In/Out Cath Urine  Result Value Ref Range Status   Specimen Description   Final    IN/OUT CATH URINE Performed at [facility], [address]  [place], [place] [address]    Special Requests   Final    NONE Performed at [facility], [address], [place], [place] [address]    Culture   Final    NO GROWTH Performed at [facility], [place] [address] [place], [place] [address]    Report Status 06/03/2020 FINAL  Final  Blood Culture (routine x 2)     Status: None   Collection Time: 06/01/20 12:30 PM   Specimen: BLOOD RIGHT FOREARM  Result Value Ref Range Status   Specimen Description   Final    BLOOD RIGHT FOREARM BOTTLES DRAWN AEROBIC AND ANAEROBIC   Special Requests Blood Culture adequate volume  Final   Culture   Final    NO GROWTH 5 DAYS Performed at [facility], [address] [place], [place] [address]    Report Status 06/06/2020 FINAL  Final  MRSA PCR Screening     Status: None   Collection Time: 06/01/20 12:40 PM   Specimen: Nasopharyngeal  Result Value Ref Range Status   MRSA by PCR NEGATIVE NEGATIVE Final    Comment:        The GeneXpert MRSA Assay (FDA approved for NASAL specimens only), is one component of a comprehensive MRSA colonization surveillance program. It is not intended to diagnose MRSA infection nor to guide or monitor treatment for MRSA infections. Performed at [facility], [place]  [address] [place], [place] [address]   MRSA PCR Screening     Status: None   Collection Time: 06/02/20  2:01 PM   Specimen: Nasopharyngeal  Result Value Ref Range Status   MRSA by PCR NEGATIVE NEGATIVE Final    Comment:        The GeneXpert MRSA Assay (FDA approved for NASAL specimens only), is one component of a comprehensive MRSA colonization surveillance program. It is not intended to diagnose MRSA infection nor to guide or monitor treatment for MRSA infections. Performed at [facility], [address] [place], [place] [address]       Radiology Studies: EEG  Result Date: 06/09/2020 Lora Havens, MD     06/09/2020  9:06 PM Patient Name: ALLISON CORUM MRN: [id number] Epilepsy Attending:  Lora Havens Referring Physician/Provider: Dr Lesleigh Noe Date: 06/09/2020 Duration: 26.29 mins Patient history: 86yo M with AMS. EEG to evaluate for seizure Level of alertness: Awake AEDs during EEG study: None Technical aspects: This EEG study was done with scalp electrodes positioned according to the 10-20 International system of electrode placement. Electrical activity was acquired at a sampling rate of 500Hz  and reviewed with a high frequency filter of 70Hz  and a low frequency filter of 1Hz . EEG data were recorded continuously and digitally stored. Description: No posterior dominant rhythm was seen. EEG showed continuous generalized 3 to 6 Hz theta-delta slowing. Hyperventilation and photic stimulation were not performed.   ABNORMALITY -Continuous slow, generalized IMPRESSION: This study is suggestive of moderate diffuse encephalopathy, nonspecific etiology. No seizures or epileptiform discharges were seen throughout the recording. Lora Havens   CT HEAD WO CONTRAST  Result Date: 06/08/2020 CLINICAL DATA:  Right MCA infarct EXAM: CT HEAD WITHOUT CONTRAST TECHNIQUE: Contiguous axial images were obtained from the base of the skull through the vertex without intravenous contrast. COMPARISON:  06/06/2020 FINDINGS: Brain: Stable appearance of the right MCA territory infarct seen previously. No significant change in the size of the infarct territory. No evidence of hemorrhagic transformation. No significant mass effect or midline shift. The lateral ventricles and midline structures are unremarkable. No acute extra-axial fluid collections. Vascular: The hyperdense right M2 branch of the MCA seen previously is less pronounced on this exam. Skull: Normal. Negative for fracture or focal lesion. Sinuses/Orbits: Fluid within the bilateral maxillary sinuses. Mucosal thickening right ethmoid air cells. Other: None. IMPRESSION: 1. Stable size of the right MCA infarct seen previously. No evidence of hemorrhagic  transformation. Electronically Signed   By: Randa Ngo M.D.   On: 06/08/2020 23:05   MR BRAIN WO CONTRAST  Addendum Date: 06/09/2020   ADDENDUM REPORT: 06/09/2020 15:59 ADDENDUM: These results were called by telephone at the time of interpretation on 06/09/2020 at 3:59 pm to provider [facility] , who verbally acknowledged these results. Electronically Signed   By: Franchot Gallo M.D.   On: 06/09/2020 15:59   Result Date: 06/09/2020 CLINICAL DATA:  Stroke.  COVID positive EXAM: MRI HEAD WITHOUT CONTRAST TECHNIQUE: Multiplanar, multiecho pulse sequences of the brain and surrounding structures were obtained without intravenous contrast. COMPARISON:  CT head 06/08/2020 FINDINGS: Brain: Moderately large right MCA infarct involving the insula, operculum, middle right frontal lobe. Mild amount of hemorrhage in the infarct. Additional small areas of acute infarct in the occipital lobes bilaterally. Ventricle size normal. Generalized atrophy. 3 mm midline shift to the left. Chronic microvascular ischemic changes in the white matter. Negative for mass lesion. Vascular: Normal arterial flow voids Skull and upper cervical spine: Negative Sinuses/Orbits:  Mucosal edema and air-fluid levels in the paranasal sinuses. Right cataract extraction Other: None IMPRESSION: Acute infarct right MCA territory with local mass-effect and mild midline shift. Mild amount of hemorrhage in the infarct Small acute infarcts in the occipital lobe bilaterally. Electronically Signed: By: Franchot Gallo M.D. On: 06/09/2020 15:42   DG CHEST PORT 1 VIEW  Result Date: 06/09/2020 CLINICAL DATA:  Shortness of breath, COVID pneumonia. EXAM: PORTABLE CHEST 1 VIEW COMPARISON:  06/01/2020 CT chest and chest radiograph. FINDINGS: Trachea is midline. Heart size normal. Slight interval improvement in patchy peripheral pulmonary parenchymal airspace opacities when compared with 06/01/2020. No definite pleural fluid. IMPRESSION: Improving COVID-19  pneumonia. Electronically Signed   By: Lorin Picket M.D.   On: 06/09/2020 11:59   DG Abd Portable 1V  Result Date: 06/09/2020 CLINICAL DATA:  Nasogastric placement EXAM: PORTABLE ABDOMEN - 1 VIEW COMPARISON:  None. FINDINGS: Orogastric or nasogastric tube enters the stomach with its tip in the fundus. Side hole is just beyond the gastroesophageal junction. Gas pattern otherwise unremarkable. IMPRESSION: Orogastric or nasogastric tube tip in the fundus. Side hole just beyond the gastroesophageal junction. Electronically Signed   By: Nelson Chimes M.D.   On: 06/09/2020 19:01   US Abdomen Limited RUQ (LIVER/GB)  Result Date: 06/09/2020 CLINICAL DATA:  Elevated bilirubin levels. EXAM: ULTRASOUND ABDOMEN LIMITED RIGHT UPPER QUADRANT COMPARISON:  None. FINDINGS: Gallbladder: Gallbladder sludge. Stone within the neck of gallbladder measures 1.4 cm. No gallbladder wall thickening or pericholecystic fluid. Negative sonographic Murphy's sign. Common bile duct: Diameter: 3.9 mm Liver: Increased parenchymal echogenicity suggestive of hepatic steatosis. No focal liver lesion. Portal vein is patent on color Doppler imaging with normal direction of blood flow towards the liver. Other: None. IMPRESSION: 1. Gallbladder sludge and gallstone. No secondary signs of acute cholecystitis. 2. No evidence for biliary ductal dilatation. 3. Echogenic liver suggestive of hepatic steatosis. Electronically Signed   By: Kerby Moors M.D.   On: 06/09/2020 16:10       LOS: 9 days   Beyla Loney [employer] on [email or web address]  06/10/2020, 12:01 PM

## 2020-06-10 NOTE — Progress Notes (Signed)
NG tube (12 fr.) inserted for tube feeding until cortrak can be placed on Wednesday per RD. NG inserted in R nare. Pt tolerated well. Awaiting placement confirmation from xray before starting tube feeds.

## 2020-06-10 NOTE — Progress Notes (Addendum)
STROKE TEAM PROGRESS NOTE   INTERVAL HISTORY No acute events in past 24 hours.   Patient denies pain or concerns currently. He is resting quietly and calmly in bed.    Vitals:   06/09/20 2003 06/10/20 0002 06/10/20 0402 06/10/20 0723  BP: 134/88 120/78 118/66 115/61  Pulse: 92 (!) 102 93 94  Resp: 20 20 17 18   Temp: (!) 97 F (36.1 C) (!) 97.2 F (36.2 C) 98.6 F (37 C) 98.4 F (36.9 C)  TempSrc: Axillary Axillary Axillary Oral  SpO2: 93% 92% 99% 95%  Weight:      Height:       CBC:  Recent Labs  Lab 06/05/20 0336 06/06/20 0432 06/07/20 0442 06/09/20 1045 06/10/20 0124  WBC 12.5* 12.2*   < > 10.6* 14.0*  NEUTROABS 11.4* 11.1*  --   --   --   HGB 13.2 13.3   < > 16.0 16.2  HCT 38.5* 39.8   < > 45.6 45.7  MCV 98.7 99.0   < > 95.6 95.6  PLT 190 204   < > 192 196   < > = values in this interval not displayed.   Basic Metabolic Panel:  Recent Labs  Lab 06/05/20 0336 06/06/20 0432 06/06/20 2358 06/09/20 1045 06/10/20 0124  NA 146* 148*   < > 145 142  K 3.6 3.6   < > 4.2 4.2  CL 110 111   < > 111 111  CO2 25 25   < > 23 22  GLUCOSE 145* 131*   < > 129* 153*  BUN 45* 44*   < > 40* 41*  CREATININE 0.84 0.75   < > 0.64 0.73  CALCIUM 8.3* 8.1*   < > 7.9* 7.8*  MG 2.6* 2.6*  --   --   --   PHOS 3.6 3.8  --   --   --    < > = values in this interval not displayed.   Lipid Panel:  Recent Labs  Lab 06/07/20 0442  CHOL 224*  TRIG 161*  HDL 45  CHOLHDL 5.0  VLDL 32  LDLCALC 147*   HgbA1c:  Recent Labs  Lab 06/07/20 0442  HGBA1C 5.2   Urine Drug Screen: No results for input(s): LABOPIA, COCAINSCRNUR, LABBENZ, AMPHETMU, THCU, LABBARB in the last 168 hours.  Alcohol Level No results for input(s): ETH in the last 168 hours.  IMAGING past 24 hours EEG  Result Date: 06/09/2020 Lora Havens, MD     06/09/2020  9:06 PM Patient Name: Joshua Robinson MRN: [id number] Epilepsy Attending: Lora Havens Referring Physician/Provider: Dr Lesleigh Noe Date:  06/09/2020 Duration: 26.29 mins Patient history: 86yo M with AMS. EEG to evaluate for seizure Level of alertness: Awake AEDs during EEG study: None Technical aspects: This EEG study was done with scalp electrodes positioned according to the 10-20 International system of electrode placement. Electrical activity was acquired at a sampling rate of 500Hz  and reviewed with a high frequency filter of 70Hz  and a low frequency filter of 1Hz . EEG data were recorded continuously and digitally stored. Description: No posterior dominant rhythm was seen. EEG showed continuous generalized 3 to 6 Hz theta-delta slowing. Hyperventilation and photic stimulation were not performed.   ABNORMALITY -Continuous slow, generalized IMPRESSION: This study is suggestive of moderate diffuse encephalopathy, nonspecific etiology. No seizures or epileptiform discharges were seen throughout the recording. Lora Havens   MR BRAIN WO CONTRAST  Addendum Date: 06/09/2020   ADDENDUM REPORT: 06/09/2020  15:59 ADDENDUM: These results were called by telephone at the time of interpretation on 06/09/2020 at 3:59 pm to provider [facility] , who verbally acknowledged these results. Electronically Signed   By: Franchot Gallo M.D.   On: 06/09/2020 15:59   Result Date: 06/09/2020 CLINICAL DATA:  Stroke.  COVID positive EXAM: MRI HEAD WITHOUT CONTRAST TECHNIQUE: Multiplanar, multiecho pulse sequences of the brain and surrounding structures were obtained without intravenous contrast. COMPARISON:  CT head 06/08/2020 FINDINGS: Brain: Moderately large right MCA infarct involving the insula, operculum, middle right frontal lobe. Mild amount of hemorrhage in the infarct. Additional small areas of acute infarct in the occipital lobes bilaterally. Ventricle size normal. Generalized atrophy. 3 mm midline shift to the left. Chronic microvascular ischemic changes in the white matter. Negative for mass lesion. Vascular: Normal arterial flow voids Skull and upper  cervical spine: Negative Sinuses/Orbits: Mucosal edema and air-fluid levels in the paranasal sinuses. Right cataract extraction Other: None IMPRESSION: Acute infarct right MCA territory with local mass-effect and mild midline shift. Mild amount of hemorrhage in the infarct Small acute infarcts in the occipital lobe bilaterally. Electronically Signed: By: Franchot Gallo M.D. On: 06/09/2020 15:42   DG CHEST PORT 1 VIEW  Result Date: 06/09/2020 CLINICAL DATA:  Shortness of breath, COVID pneumonia. EXAM: PORTABLE CHEST 1 VIEW COMPARISON:  06/01/2020 CT chest and chest radiograph. FINDINGS: Trachea is midline. Heart size normal. Slight interval improvement in patchy peripheral pulmonary parenchymal airspace opacities when compared with 06/01/2020. No definite pleural fluid. IMPRESSION: Improving COVID-19 pneumonia. Electronically Signed   By: Lorin Picket M.D.   On: 06/09/2020 11:59   DG Abd Portable 1V  Result Date: 06/09/2020 CLINICAL DATA:  Nasogastric placement EXAM: PORTABLE ABDOMEN - 1 VIEW COMPARISON:  None. FINDINGS: Orogastric or nasogastric tube enters the stomach with its tip in the fundus. Side hole is just beyond the gastroesophageal junction. Gas pattern otherwise unremarkable. IMPRESSION: Orogastric or nasogastric tube tip in the fundus. Side hole just beyond the gastroesophageal junction. Electronically Signed   By: Nelson Chimes M.D.   On: 06/09/2020 19:01   US Abdomen Limited RUQ (LIVER/GB)  Result Date: 06/09/2020 CLINICAL DATA:  Elevated bilirubin levels. EXAM: ULTRASOUND ABDOMEN LIMITED RIGHT UPPER QUADRANT COMPARISON:  None. FINDINGS: Gallbladder: Gallbladder sludge. Stone within the neck of gallbladder measures 1.4 cm. No gallbladder wall thickening or pericholecystic fluid. Negative sonographic Murphy's sign. Common bile duct: Diameter: 3.9 mm Liver: Increased parenchymal echogenicity suggestive of hepatic steatosis. No focal liver lesion. Portal vein is patent on color Doppler  imaging with normal direction of blood flow towards the liver. Other: None. IMPRESSION: 1. Gallbladder sludge and gallstone. No secondary signs of acute cholecystitis. 2. No evidence for biliary ductal dilatation. 3. Echogenic liver suggestive of hepatic steatosis. Electronically Signed   By: Kerby Moors M.D.   On: 06/09/2020 16:10    PHYSICAL EXAM Constitutional: Frail elderly male lying in bed with eyes closed. Arouses easily when his name is called. Mildly drowsy. Drifts to sleep unless stimulated verbally.  Psych: Calm and cooperative.  Cardiovascular: Irregular rate and rhythm on cardiac monitor Resp: Tachypnea noted, some audible congestion heard, weak cough, oxygen in use GI: Soft.  No distension. There is no tenderness.  Skin: small dried scab on bridge of nose; from BiPAP per chart review. No active hemorrhage.   Neuro: Mental Status: Patient is awake, alert, oriented to self, states place is hospital. Does not answer date/year questions (appears to struggle to answer).  Speech is dysarthric, aphasic. He  speaks with one word communication with delayed responses.  Does appear to have some neglect to the left. Impaired gaze to left.  Cranial Nerves: II: Visual fields are full. Blink to threat present. Pupils are equal, round, and reactive to light 3 mm/brisk. III,IV, VI: Patient able to look fully downward and towards the right, only slightly crosses midline towards the left. Conservation officer, nature. No ptosis or diplopia.  V: Facial sensation is symmetric to light touch VII: Facial movement is asymmetric with left-sided weakness. VIII: Hearing is intact to voice X: Palate elevates symmetrically XI: Shoulder shrug asymmetric, stronger on the right.  XII: Tongue appears to protrude midline but limited due to patient weakness and mouth dryness; only partial protrusion Motor: Tone is normal. Bulk is normal. Right hemibody 4/5 strength  Left upper extremity fairly nearly flaccid, trace  movement, left lower extremity flaccid.  Sensory: Sensation is symmetric to light touch and temperature in the arms and legs.   Plantars: Toes are downgoing bilaterally.   ASSESSMENT/PLAN 86 year old male without documented medical history on no chronic home medications but with a 50-pack-year smoking history who presented to [facility] on 1/16 with  shortness of breath (O2 saturation 67%), fevers and chills and was found to be COVID positive.  He also developed atrial fibrillation with RVR. He required BiPAP and diltiazem drip.  Patient was found on 06/06/2020 in the evening with acute onset left-sided hemiparesis, aphasia, and left facial droop prompting stat CODE STROKE CT imaging. CT imaging found a right MCA distribution infarct, a hyperdense vessel at the base of the right sylvian fissure, possibly reflecting a proximal M2 thrombus, and underlying atrophy with chronic small vessel ischemic disease and was then transferred to [facility] on 06/08/20 for further management  Stroke - Right MCA large stroke as well as b/l MCA/PCA punctate infarcts, likely cardioembolic in the setting of new onset Afib RVR and COVID hypercoagulability   CT head 1/21: Evolving cytotoxic edema involving the right insula and overlyingright frontal lobe, consistent with acute to early subacute right MCA distribution infarct.  CTA head & neck: Acute right M2 branch occlusion, superior division  MRI:  Acute infarct right MCA territory with local mass-effect and mild midline shift. Mild amount of hemorrhage in the infarct  CT head repeated 1/23 showing stable size of the right MCA infarct seen previously. No evidence of hemorrhagic transformation  Carotid Doppler:  Minor carotid atherosclerosis.   2D Echo: EF 55-60%   EEG no seizure  LDL 147   HgbA1c 5.2  VTE prophylaxis - Heparin IV.   No daily medications of any kind prior to admission, was on lovenox bid, now on heparin IV  Therapy recommendations:  SNF at  present   Disposition:  TBD  Atrial Friblliation with RVR  New diagnosis in the setting of COVID  Was on cardizem IV -> metoprolol  Has tolerated therapeutic Lovenox x 6 days -> heparin drip.   Primary team managing IV metoprolol  Rate controlled  Telemetry monitoring ongoing  Acute metabolic encephalopathy  HCT repeated 1/23 without new findings.   ABG without hypercapnia.   Ammonia level minimally elevated at 44 on 1/24 .   UA negative for infection.   CXR shows some improvement in infiltrates.    Elevated AST ALT with and bilirubin levels.   RUQ [place] without evidence of cholecystitis or biliary ductal dilatation. It was suggestive of hepatic steatosis.   EEG showed no seizure activity.   Acute respiratory failure with hypoxia secondary to  COVID 19 COVID pneumonia  Off BIPAP and tolerating nasal cannula.   Afebrile.   Holding off on antibiotics for now.   On solumedrol  Management per primary team and pulmonary consultant.   Blood pressure management  No history of HTN . BP stable . Long-term BP goal normotensive  Hyperlipidemia  Home meds - none  LDL 147, goal < 70  Statin on hold due to elevated LFTs  AST/ALT 121/327->121/351  Continue statin at discharge if appropriate depending upon liver function status  Dysphagia  Did not pass swallow  N.p.o. currently  On tube feeding  IV fluid  Other Stroke Risk Factors  Advanced Age >/= 39   Former smoker: reports that he quit smoking about 24 years ago. His smoking use included cigarettes. He quit after 40.00 years of use. His smokeless tobacco use includes snuff.    Alcohol use - on FA/B1  Other Active Problems   Palliative care is following.  Patient's family currently desires full scope of care. Prognosis however remains guarded to poor per primary team  Sick thyroid - TSH 0.124, Free T4 1.60   This plan of care was discussed with and directed by Dr. Erlinda Hong.  Hetty Blend,  NP-C  Hospital day # 9  ATTENDING NOTE: I reviewed above note and agree with the assessment and plan. Pt was seen and examined.   86 year old male smoker and alcohol user admitted for fever, cough, shortness of breath on 05/24/2020.  Has desaturation at home and diagnosed with Covid pneumonia.  Was treated with steroids and 80s Romycin.  Found to have A. fib RVR and was treated with Cardizem IV.  CT chest showed no PE.  However, on 06/07/2020 found to have left sided weakness, left facial droop and aphasia.  CT showed right MCA large infarct, and right M2 hyperdensity.  CT head and neck showed right M2 occlusion, along with bilateral pneumonia consistent with Covid.  Carotid Doppler negative.  CT repeat stable infarct.  MRI showed right MCA large infarct but also bilateral MCA/PCA punctate infarcts.  EF 50 to 55%.  A1c 5.2 LDL 147.  Patient D-dimer more than 20 and double origin more than 800.  Creatinine 0.64.  On exam, patient lethargic, however eyes open, attempted to talk with questions, however intangible words.  Not able to name and repeat.  However, follow all simple commands.  Eyes midline, bilateral gaze incomplete, no disconjugate eyes.  Blinking to visual threat on the right but not on the left.  Left facial droop, tongue midline.  Left upper extremity flaccid left lower extremity mild withdraw to pain.  Right upper and lower extremity at least 3/5 with pain summation.  Sensation subjectively symmetrical, right finger-to-nose slow but intact.  Patient stroke likely due to new diagnosed A. fib with RVR and possible hypercoagulable state due to Covid pneumonia.  Although large size of infarct, patient has been on Lovenox therapeutic dose for the last 6 days and tolerating well without hemorrhage.  Given his ongoing A. fib RVR and hypercarbic state, recommend heparin IV per stroke protocol.  Hold off statin for now due to transaminitis.   Patient lethargic with mild encephalopathy likely due to  Covid infection, hypoxia and slightly elevated ammonia level.  EEG no seizure but mild encephalopathy. Treatment per primary team.  Patient still has dysphagia, on IV fluid and tube feeding. Will follow.  Rosalin Hawking, MD PhD Stroke Neurology 06/10/2020 4:17 PM  Patient has large right MCA infarct, Covid pneumonia, continues to  have mild encephalopathy. I discussed with Dr. [place] Pink. I spent  35 minutes in total face-to-face time with the patient, more than 50% of which was spent in counseling and coordination of care, reviewing test results, images and medication, and discussing the diagnosis, treatment plan and potential prognosis. This patient's care requiresreview of multiple databases, neurological assessment, discussion with family, other specialists and medical decision making of high complexity.     To contact Stroke Continuity provider, please refer to [email or web address] After hours, contact General Neurology

## 2020-06-10 NOTE — Progress Notes (Signed)
ANTICOAGULATION CONSULT NOTE - Follow Up Consult  Pharmacy Consult for IV Heparin Indication: Atrial fibrillation  Allergies  Allergen Reactions  . Bee Venom Other (See Comments)    Stung a few times and passed out    Patient Measurements: Height: 5\' 8"  (172.7 cm) Weight: 68.2 kg (150 lb 5.7 oz) IBW/kg (Calculated) : 68.4 Heparin Dosing Weight: 68.2  Vital Signs: Temp: 98.2 F (36.8 C) (01/25 2018) Temp Source: Axillary (01/25 2018) BP: 135/72 (01/25 2018) Pulse Rate: 93 (01/25 2018)  Labs: Recent Labs    06/09/20 1045 06/10/20 0124 06/10/20 1937  HGB 16.0 16.2  --   HCT 45.6 45.7  --   PLT 192 196  --   HEPARINUNFRC  --   --  0.57  CREATININE 0.64 0.73  --     Estimated Creatinine Clearance: 67.5 mL/min (by C-G formula based on SCr of 0.73 mg/dL).   Medical History: Past Medical History:  Diagnosis Date  . Arthritis   . Cancer [facility])    skin   Assessment: 86 yr old male with no significant medical history known presented with COVID, then developed a new embolic stroke and atrial fibrillation. Pharmacy was consulted for heparin dosing/monitoring; discussed with primary team and Dr. Erlinda Hong; will start stroke protocol due to stroke diagnosis (low goal and no heparin boluses).   Patient was on therapeutic enoxaparin until yesterday; last dose in AM 1/24.  Initial heparin level ~8.5 hrs after starting heparin infusion at 950 units/hr (no bolus) was 0.57 units/ml, which is above the goal range for this pt. H/H, plt WNL. Per RN, no issues with IV or bleeding observed.  Goal of Therapy:  Heparin level: 0.3-0.5 units/ml Monitor platelets by anticoagulation protocol: Yes   Plan:  Decrease heparin infusion to 850 units/hr Check heparin level in 8 hours Monitor daily heparin level, CBC Monitor for bleeding  Gillermina Hu, PharmD, BCPS, [facility] Clinical Pharmacist 06/10/2020,8:41 PM

## 2020-06-10 NOTE — Plan of Care (Signed)
  Problem: Education: Goal: Knowledge of General Education information will improve Description: Including pain rating scale, medication(s)/side effects and non-pharmacologic comfort measures Outcome: Progressing   Problem: Health Behavior/Discharge Planning: Goal: Ability to manage health-related needs will improve Outcome: Progressing   Problem: Clinical Measurements: Goal: Ability to maintain clinical measurements within normal limits will improve Outcome: Progressing Goal: Will remain free from infection Outcome: Progressing Goal: Diagnostic test results will improve Outcome: Progressing Goal: Respiratory complications will improve Outcome: Progressing Goal: Cardiovascular complication will be avoided Outcome: Progressing   Problem: Nutrition: Goal: Adequate nutrition will be maintained Outcome: Progressing   Problem: Coping: Goal: Level of anxiety will decrease Outcome: Progressing   Problem: Elimination: Goal: Will not experience complications related to bowel motility Outcome: Progressing Goal: Will not experience complications related to urinary retention Outcome: Progressing   Problem: Pain Managment: Goal: General experience of comfort will improve Outcome: Progressing   Problem: Safety: Goal: Ability to remain free from injury will improve Outcome: Progressing   Problem: Skin Integrity: Goal: Risk for impaired skin integrity will decrease Outcome: Progressing   Problem: Education: Goal: Knowledge of disease or condition will improve Outcome: Progressing Goal: Knowledge of secondary prevention will improve Outcome: Progressing Goal: Knowledge of patient specific risk factors addressed and post discharge goals established will improve Outcome: Progressing Goal: Individualized Educational Video(s) Outcome: Progressing   Problem: Coping: Goal: Will verbalize positive feelings about self Outcome: Progressing   Problem: Health Behavior/Discharge  Planning: Goal: Ability to manage health-related needs will improve Outcome: Progressing   Problem: Nutrition: Goal: Dietary intake will improve Outcome: Progressing   Problem: Education: Goal: Knowledge of risk factors and measures for prevention of condition will improve Outcome: Progressing   Problem: Education: Goal: Knowledge of secondary prevention will improve Outcome: Progressing   Problem: Activity: Goal: Risk for activity intolerance will decrease Outcome: Not Progressing   Problem: Self-Care: Goal: Ability to participate in self-care as condition permits will improve Outcome: Not Progressing Goal: Verbalization of feelings and concerns over difficulty with self-care will improve Outcome: Not Progressing Goal: Ability to communicate needs accurately will improve Outcome: Not Progressing   Problem: Ischemic Stroke/TIA Tissue Perfusion: Goal: Complications of ischemic stroke/TIA will be minimized Outcome: Not Progressing

## 2020-06-10 NOTE — Progress Notes (Signed)
  Speech Language Pathology Treatment: Dysphagia;Cognitive-Linquistic  Patient Details Name: Joshua Robinson MRN: [id number] DOB: [birth year] Today's Date: 06/10/2020 Time: 9326-7124 SLP Time Calculation (min) (ACUTE ONLY): 26 min  Assessment / Plan / Recommendation Clinical Impression  Pt was seen for treatment. He was adequately alert for participation, but exhibited difficulty maintaining alertness towards the session. His awareness of impairments was improved during this session. He stated that he is in the hospital secondary to Joshua Robinson and a stroke; however, he denied any impairments related to either even after the session was completed. He achieved 20% accuracy with problem solving related to safety problem increasing to 50% with cues. Oral care was provided. Labial seal and stripping were improved. Pt exhibited difficulty sucking from a straw despite cueing and blowing was intermittently noted. Pt demonstrated moderate residue in the right lateral sulcus with purees which was not cleared with liquid washes via tsp or with cued secondary swallows. He exhibited reduced bolus awareness and reduced frequent cues to swallow even after 20 seconds of intermittent/absent bolus manipulation had elapsed. Pt exhibited throat clearing with ice chips and inconsistently with puree boluses, and coughing with thin liquids via tsp. Pt does not present as a candidate for safe oral intake at this time. SLP will continue to follow for treatment.    HPI HPI: 86 year old male with no documented chronic medical problems, but is a reformed smoker of 50 pack years presenting with myalgias, coughing, and shortness of breath that began on 05/24/2020.  The patient states that he visited his PCP on a virtual visit on 05/27/2019.  He was given oral steroids and azithromycin.  His symptoms did not improve.  He continued to have worsening shortness of breath, coughing.  He did have some intermittent chest discomfort with coughing but  denied any hemoptysis.  He had some loose stools but did not have any hematochezia or melena.  He also had subjective fevers and chills at home.  He has had 2 doses of the Moderna vaccination but has not been boosted.  EMS was activated and the patient was noted to have oxygen saturation 67% on room air.  He was placed on CPAP and brought to the hospital.  In the emergency department, the patient was subsequently placed on high flow nasal cannula.  He did not tolerate it well and was placed on BiPAP.  He has been subsequently transitioned to heated high flow at 40 L. CT showed Evolving cytotoxic edema involving the right insula and overlying right frontal lobe, consistent with acute to early subacute right  MCA distribution infarct.. Pt is tolerating 10 L HFNC today. BSE requested      SLP Plan  Continue with current plan of care       Recommendations  Diet recommendations: NPO Medication Administration: Via alternative means                Oral Care Recommendations: Oral care QID Follow up Recommendations: Skilled Nursing facility SLP Visit Diagnosis: Dysphagia, unspecified (R13.10) Plan: Continue with current plan of care       Joshua Robinson I. Hardin Negus, [place], [place] Office number [phone] Pager [place] 06/10/2020, 5:19 PM

## 2020-06-10 NOTE — Progress Notes (Addendum)
ANTICOAGULATION CONSULT NOTE - Initial Consult  Pharmacy Consult for heparin Indication: atrial fibrillation  Allergies  Allergen Reactions  . Bee Venom Other (See Comments)    Stung a few times and passed out    Patient Measurements: Height: 5\' 8"  (172.7 cm) Weight: 68.2 kg (150 lb 5.7 oz) IBW/kg (Calculated) : 68.4 Heparin Dosing Weight: 68.2  Vital Signs: Temp: 98.4 F (36.9 C) (01/25 0723) Temp Source: Oral (01/25 0723) BP: 115/61 (01/25 0723) Pulse Rate: 94 (01/25 0723)  Labs: Recent Labs    06/09/20 1045 06/10/20 0124  HGB 16.0 16.2  HCT 45.6 45.7  PLT 192 196  CREATININE 0.64 0.73    Estimated Creatinine Clearance: 67.5 mL/min (by C-G formula based on SCr of 0.73 mg/dL).   Medical History: Past Medical History:  Diagnosis Date  . Arthritis   . Cancer [facility])    skin   Assessment: 86 yo male with no significant medical history known presents with COVID then developed a new embolic stroke and atrial fibrillation. Pharmacy consulted for heparin drip. D/W Primary and Dr. Erlinda Hong, will start stroke protocol due to stroke diagnosis (low goal and no boluses).   Patient on therapeutic lovenox until yesterday. Last dose in AM 1/24)  Goal of Therapy:  Heparin level 0.3-0.5 Monitor platelets by anticoagulation protocol: Yes   Plan:  Heparin drip at 950 units/hr (no bolus) Heparin level in 8 hours Heparin level and CBC daily with AM labs Monitor for bleeding  Troyce Febo A. Levada Dy, PharmD, BCPS, FNKF Clinical Pharmacist [place] Please utilize Amion for appropriate phone number to reach the unit pharmacist ([place])    06/10/2020,9:58 AM

## 2020-06-10 NOTE — Progress Notes (Signed)
NG tube advanced 3 inches per MD.  Tube feeds held. Awaiting confirmation xray of placement.

## 2020-06-11 ENCOUNTER — Inpatient Hospital Stay (HOSPITAL_COMMUNITY): Payer: Medicare Other

## 2020-06-11 DIAGNOSIS — Z7189 Other specified counseling: Secondary | ICD-10-CM

## 2020-06-11 DIAGNOSIS — I4891 Unspecified atrial fibrillation: Secondary | ICD-10-CM | POA: Diagnosis not present

## 2020-06-11 DIAGNOSIS — I63411 Cerebral infarction due to embolism of right middle cerebral artery: Secondary | ICD-10-CM | POA: Diagnosis not present

## 2020-06-11 DIAGNOSIS — I639 Cerebral infarction, unspecified: Secondary | ICD-10-CM | POA: Diagnosis not present

## 2020-06-11 DIAGNOSIS — J9601 Acute respiratory failure with hypoxia: Secondary | ICD-10-CM | POA: Diagnosis not present

## 2020-06-11 LAB — CBC
HCT: 42.3 % (ref 39.0–52.0)
Hemoglobin: 14.7 g/dL (ref 13.0–17.0)
MCH: 33.5 pg (ref 26.0–34.0)
MCHC: 34.8 g/dL (ref 30.0–36.0)
MCV: 96.4 fL (ref 80.0–100.0)
Platelets: 165 10*3/uL (ref 150–400)
RBC: 4.39 MIL/uL (ref 4.22–5.81)
RDW: 13.6 % (ref 11.5–15.5)
WBC: 12.1 10*3/uL — ABNORMAL HIGH (ref 4.0–10.5)
nRBC: 0 % (ref 0.0–0.2)

## 2020-06-11 LAB — HEPATITIS PANEL, ACUTE
HCV Ab: NONREACTIVE
Hep A IgM: NONREACTIVE
Hep B C IgM: NONREACTIVE
Hepatitis B Surface Ag: NONREACTIVE

## 2020-06-11 LAB — COMPREHENSIVE METABOLIC PANEL
ALT: 361 U/L — ABNORMAL HIGH (ref 0–44)
AST: 87 U/L — ABNORMAL HIGH (ref 15–41)
Albumin: 2.4 g/dL — ABNORMAL LOW (ref 3.5–5.0)
Alkaline Phosphatase: 76 U/L (ref 38–126)
Anion gap: 7 (ref 5–15)
BUN: 42 mg/dL — ABNORMAL HIGH (ref 8–23)
CO2: 24 mmol/L (ref 22–32)
Calcium: 7.7 mg/dL — ABNORMAL LOW (ref 8.9–10.3)
Chloride: 114 mmol/L — ABNORMAL HIGH (ref 98–111)
Creatinine, Ser: 0.76 mg/dL (ref 0.61–1.24)
GFR, Estimated: >60 mL/min (ref >60)
Glucose, Bld: 193 mg/dL — ABNORMAL HIGH (ref 70–99)
Potassium: 4.4 mmol/L (ref 3.5–5.1)
Sodium: 145 mmol/L (ref 135–145)
Total Bilirubin: 2.6 mg/dL — ABNORMAL HIGH (ref 0.3–1.2)
Total Protein: 4.9 g/dL — ABNORMAL LOW (ref 6.5–8.1)

## 2020-06-11 LAB — GLUCOSE, CAPILLARY
Glucose-Capillary: 102 mg/dL — ABNORMAL HIGH (ref 70–99)
Glucose-Capillary: 102 mg/dL — ABNORMAL HIGH (ref 70–99)
Glucose-Capillary: 122 mg/dL — ABNORMAL HIGH (ref 70–99)
Glucose-Capillary: 128 mg/dL — ABNORMAL HIGH (ref 70–99)
Glucose-Capillary: 130 mg/dL — ABNORMAL HIGH (ref 70–99)
Glucose-Capillary: 163 mg/dL — ABNORMAL HIGH (ref 70–99)

## 2020-06-11 LAB — HEPARIN LEVEL (UNFRACTIONATED)
Heparin Unfractionated: 0.56 IU/mL (ref 0.30–0.70)
Heparin Unfractionated: 0.5 IU/mL (ref 0.30–0.70)

## 2020-06-11 MED ORDER — FREE WATER: 200.0000 mL | Freq: Four times a day (QID) | Status: DC

## 2020-06-11 MED ORDER — FREE WATER
100.0000 mL | Freq: Four times a day (QID) | Status: DC
  Administered 2020-06-11 – 2020-06-14 (×12): 100 mL

## 2020-06-11 NOTE — Progress Notes (Signed)
Physical Therapy Treatment Patient Details Name: Joshua Robinson MRN: [id number] DOB: Aug 21, 1934 Today's Date: 06/11/2020    History of Present Illness 86 year old male with no documented chronic medical problems, but is a reformed smoker of 50 pack years presenting with myalgias, coughing, and shortness of breath that began on 05/24/2020.  The patient states that he visited his PCP on a virtual visit on 05/27/2019.  He was given oral steroids and azithromycin.  His symptoms did not improve.  He continued to have worsening shortness of breath, coughing.  He did have some intermittent chest discomfort with coughing but denied any hemoptysis.  He had some loose stools but did not have any hematochezia or melena.  He also had subjective fevers and chills at home.  He has had 2 doses of the Moderna vaccination but has not been boosted.  EMS was activated and the patient was noted to have oxygen saturation 67% on room air.  He was placed on CPAP and brought to the hospital.  In the emergency department, the patient was subsequently placed on high flow nasal cannula.  He did not tolerate it well and was placed on BiPAP.  He has been subsequently transitioned to heated high flow at 40 L.  The patient was given Actemra and started on intravenous steroids.  In addition, the patient was noted to have atrial fibrillation with RVR.  He was started on a diltiazem drip. New onset of LUE/LLE hemiplegia due to recent right MCA sroke    PT Comments    Pt seen in session with OT. He required +2 total assist supine to sit, total assist to maintain sitting balance EOB x 10-12 minutes, and +2 total assist sit to supine. Active engagement noted for initiation of movement. Pt, however, presenting with significant strength and coordination deficits requiring +2 total assist to complete mobility skills. Flaccid LUE/LE with increased edema noted LUE. Addressed balance and trunk mobility while seated EOB. Pt cooperative and engaged.  Following simple commands consistently. Pt supine in bed at end of session. Fatigue noted. Towel rolls utilized under pillow on R to decrease R lateral flexion at neck and encourage midline positioning.    Follow Up Recommendations  SNF;LTACH     Equipment Recommendations  Other (comment) (defer to post acute)    Recommendations for Other Services       Precautions / Restrictions Precautions Precautions: Fall;Other (comment) Precaution Comments: Cortrak, fragile skin Restrictions Weight Bearing Restrictions: No    Mobility  Bed Mobility Overal bed mobility: Needs Assistance Bed Mobility: Supine to Sit;Sit to Supine     Supine to sit: +2 for physical assistance;Total assist Sit to supine: Total assist;+2 for physical assistance   General bed mobility comments: active engagement for initiation of mobility noted, assist for all aspects of mobility, use of bed pads to scoot to EOB  Transfers                 General transfer comment: will need +2; lift equipment (maximove)  Ambulation/Gait                 Stairs             Wheelchair Mobility    Modified Rankin (Stroke Patients Only) Modified Rankin (Stroke Patients Only) Pre-Morbid Rankin Score: No symptoms Modified Rankin: Severe disability     Balance Overall balance assessment: Needs assistance Sitting-balance support: Single extremity supported;Feet supported Sitting balance-Leahy Scale: Zero Sitting balance - Comments: total assist to maintain sitting balance EOB.  Pt tolerated EOB x 10-12 minutes. Postural control: Posterior lean;Left lateral lean                                  Cognition Arousal/Alertness: Awake/alert Behavior During Therapy: Flat affect Overall Cognitive Status: Difficult to assess Area of Impairment: Attention;Awareness;Problem solving;Safety/judgement                   Current Attention Level: Sustained     Safety/Judgement: Decreased  awareness of safety;Decreased awareness of deficits Awareness: Emergent Problem Solving: Slow processing;Decreased initiation;Difficulty sequencing;Requires verbal cues;Requires tactile cues General Comments: L neglect, consistently following one-step commands, answering simple yes/no questions appropriately      Exercises      General Comments General comments (skin integrity, edema, etc.): Vitals stable on RA      Pertinent Vitals/Pain Pain Assessment: No/denies pain Faces Pain Scale: No hurt    Home Living                      Prior Function            PT Goals (current goals can now be found in the care plan section) Acute Rehab PT Goals Patient Stated Goal: Per wife for pt to get stronger and to be able to bring him home Progress towards PT goals: Progressing toward goals    Frequency    Min 3X/week      PT Plan Current plan remains appropriate    Co-evaluation PT/OT/SLP Co-Evaluation/Treatment: Yes Reason for Co-Treatment: Complexity of the patient's impairments (multi-system involvement);For patient/therapist safety;To address functional/ADL transfers;Necessary to address cognition/behavior during functional activity PT goals addressed during session: Mobility/safety with mobility;Balance        AM-PAC PT "6 Clicks" Mobility   Outcome Measure  Help needed turning from your back to your side while in a flat bed without using bedrails?: Total Help needed moving from lying on your back to sitting on the side of a flat bed without using bedrails?: Total Help needed moving to and from a bed to a chair (including a wheelchair)?: Total Help needed standing up from a chair using your arms (e.g., wheelchair or bedside chair)?: Total Help needed to walk in hospital room?: Total Help needed climbing 3-5 steps with a railing? : Total 6 Click Score: 6    End of Session   Activity Tolerance: Patient tolerated treatment well Patient left: in bed;with call  bell/phone within reach;with bed alarm set Nurse Communication: Mobility status;Need for lift equipment PT Visit Diagnosis: Muscle weakness (generalized) (M62.81);Hemiplegia and hemiparesis;Other abnormalities of gait and mobility (R26.89) Hemiplegia - Right/Left: Left Hemiplegia - caused by: Cerebral infarction     Time: 4782-9562 PT Time Calculation (min) (ACUTE ONLY): 27 min  Charges:  $Therapeutic Activity: 8-22 mins                     Lorrin Goodell, PT  Office # [phone] Pager [phone]    Lorriane Shire 06/11/2020, 12:10 PM

## 2020-06-11 NOTE — Progress Notes (Addendum)
PROGRESS NOTE  Joshua Robinson XFG:[id number] DOB: 1934/09/10 DOA: 06/01/2020  PCP: Redmond School, MD  Brief History/Interval Summary: 86 year old male with no documented chronic medical problems, but is a reformed smoker of 50 pack years presenting with myalgias, coughing, and shortness of breath that began on 05/24/2020.The patient states that he visited his PCP on a virtual visit on 05/27/2019. He was given oral steroids and azithromycin. His symptoms did not improve. He continued to have worsening shortness of breath, coughing. He has had 2 doses of the Moderna vaccination but has not been boosted.  Found to be positive for COVID-19. In the emergency department, the patient was subsequently placed on high flow nasal cannula. He did not tolerate it well and was placed on BiPAP. He has been subsequently transitioned to heated high flow.  Hospitalized to the intensive care unit and independent.  Respiratory status started improving however patient then developed embolic stroke.  Subsequently transferred to [facility].  Reason for Visit: Pneumonia due to COVID-19.  Acute respiratory failure with hypoxia.  Acute embolic stroke.  Consultants: Pulmonology.  Neurology  Procedures: None yet  Antibiotics: Anti-infectives (From admission, onward)   Start     Dose/Rate Route Frequency Ordered Stop   06/02/20 1000  cefTRIAXone (ROCEPHIN) 2 g in sodium chloride 0.9 % 100 mL IVPB  Status:  Discontinued        2 g 200 mL/hr over 30 Minutes Intravenous Every 24 hours 06/02/20 0638 06/04/20 1004   06/02/20 1000  azithromycin (ZITHROMAX) 500 mg in sodium chloride 0.9 % 250 mL IVPB  Status:  Discontinued        500 mg 250 mL/hr over 60 Minutes Intravenous Every 24 hours 06/02/20 0638 06/04/20 1004   06/02/20 0600  vancomycin (VANCOREADY) IVPB 750 mg/150 mL  Status:  Discontinued        750 mg 150 mL/hr over 60 Minutes Intravenous Every 12 hours 06/01/20 1333 06/02/20 1136   06/01/20  1245  vancomycin (VANCOREADY) IVPB 1500 mg/300 mL        1,500 mg 150 mL/hr over 120 Minutes Intravenous  Once 06/01/20 1239 06/01/20 1627   06/01/20 1230  cefTRIAXone (ROCEPHIN) 1 g in sodium chloride 0.9 % 100 mL IVPB        1 g 200 mL/hr over 30 Minutes Intravenous  Once 06/01/20 1223 06/01/20 1337   06/01/20 1230  azithromycin (ZITHROMAX) 500 mg in sodium chloride 0.9 % 250 mL IVPB        500 mg 250 mL/hr over 60 Minutes Intravenous  Once 06/01/20 1223 06/01/20 1425      Subjective/Interval History:  No Significant events as discussed with staff, he followed he failed his swallow evaluation this morning, he had his NGT replaced by core track today.   Assessment/Plan:  Acute Hypoxic Resp. Failure/Pneumonia due to COVID-19   Recent Labs  Lab 06/05/20 0336 06/06/20 0432 06/09/20 1045 06/10/20 0124 06/11/20 0451  DDIMER >20.00* >20.00*  --   --   --   FERRITIN 549* 549*  --   --   --   CRP 6.6* 3.5*  --   --   --   ALT 32 43 327* 351* 361*    Objective findings: Oxygen requirements: 1 to 2 L of oxygen by nasal cannula saturations noted in the mid 90s.    COVID 19 Therapeutics: Antibacterials: Antibiotics were given in the ED but not continued.  Procalcitonin was 0.19 Remdesivir: It appears that he did not receive Remdesivir  Steroids: Remains on Solu-Medrol.  We will start tapering. Diuretics: Not on scheduled diuretics Inhaled Steroids: None noted Actemra/Baricitinib: Patient was given Actemra PUD Prophylaxis: On Protonix DVT Prophylaxis: On therapeutic Lovenox  Tested positive on 1/16  From a respiratory standpoint patient appears to be improving.  His oxygen requirements have gone down significantly.  Patient was treated with steroids and Actemra.  Did not get Remdesivir. Underwent CT angiogram on 1/17 which was negative for PE.  No need for antibacterials currently.  Start tapering steroids.  Acute embolic right MCA stroke/acute metabolic encephalopathy Patient  remains encephalopathic.  He is not moving his left upper or lower extremity.  He is able to squeeze my fingers with his right hand.  Able to wiggle his right toes.   While he was at [facility] patient was found to have an acute stroke.  Discussions were held with neurology.  Patient was transferred to [facility]   MRI showed right MCA stroke with tiny hemorrhagic transformation.  Discussed with neurology yesterday who recommended discontinuing Lovenox. Stroke service is following.  Discussed with Dr. Erlinda Hong this morning he recommends reinitiating anticoagulation with IV heparin.   Patient remains at very high risk for aspiration.  Speech therapy is following.  Will need PEG tube as discussed with SLP, and neurology, discussed with wife, she is agreeable, will consult IR, meanwhile we will keep on heparin gtt. till procedure is done, then can be switched to Eliquis. Other etiologies for his alteration in mental status have been pursued.  ABG did not show any hypercapnia.  Ammonia level was only minimally elevated.  UA did not show infection.   LDL 147.  Initiate statin once he is able to take orally. HbA1c 5.2. EEG did not show any epileptiform activity  Atrial fibrillation with RVR Initially patient was requiring diltiazem infusion.  Currently on IV metoprolol.  Was on therapeutic Lovenox which was held yesterday due to concern for hemorrhagic transformation.  Discussed with stroke neurologist today.  It is okay to continue anticoagulation, heparin versus Lovenox versus Eliquis should be appropriate, for now we will keep on heparin GTT for possible need for PEG insertion.  Oropharyngeal dysphagia - on core track tube feed, will need PEG  Abnormal LFTs Patient was found to have transaminitis as well as hyperbilirubinemia.  Fractionated bilirubin showed equal direct and indirect bilirubin.  No anemia noted.  Renal function was normal.  Right upper quadrant ultrasound does show  gallstones and gallbladder sludge.  No evidence for acute cholecystitis.  Hepatic steatosis noted.  No biliary ductal dilatation.  Alkaline phosphatase is normal.  Patient is nontender on abdominal examination.  Will check hepatitis panel.  Bilirubin could be elevated due to acute illness/Covid.  Continue to trend. -Hold statins  Hyponatremia Appears to have resolved.  Possibly due to volume depletion.    Mildly elevated troponin Thought to be secondary to demand ischemia from tachyarrhythmia.  Echocardiogram showed EF to be 50 to 55% without any wall motion abnormality.  Do not anticipate any further inpatient cardiac work-up at this time.  Hyperglycemia HbA1c 5.2.  Likely due to steroids.  Seems to be stable.  Continue sliding scale coverage.  Presumptive COPD Quit tobacco about 20 years ago.  Has a 50-pack-year history of smoking.  Continue inhalers.  Goals of care Palliative care is following.  Patient's family currently desires full scope of care.  Prognosis however remains guarded to poor.   DVT Prophylaxis: On therapeutic Lovenox Code Status: Full code Family  Communication: Wife was updated by phone today, as well I have a lengthy discussion with daughter-in-law [name] who is an Therapist, sports, and familiar with her father-in-law case, I have asked her and requested her to come to see the patient, and to see the degree of decline and significant left-sided weakness, so we can proceed with appropriate goals of care discussion, potential placement, and decision PEG tube placement.  Disposition Plan: Unclear for now.  Will likely need to go to skilled nursing facility  Status is: Inpatient  Remains inpatient appropriate because:Altered mental status, IV treatments appropriate due to intensity of illness or inability to take PO and Inpatient level of care appropriate due to severity of illness   Dispo: The patient is from: Home              Anticipated d/c is to: To be determined               Anticipated d/c date is: 3 days              Patient currently is not medically stable to d/c.   Difficult to place patient No     Medications:  Scheduled: . Chlorhexidine Gluconate Cloth  6 each Topical Daily  . feeding supplement (PROSource TF)  45 mL Per Tube BID  . folic acid  1 mg Intravenous Daily  . free water  200 mL Per Tube Q6H  . insulin aspart  0-9 Units Subcutaneous Q4H  . mouth rinse  15 mL Mouth Rinse BID  . methylPREDNISolone (SOLU-MEDROL) injection  40 mg Intravenous Q12H  . metoprolol tartrate  2.5 mg Intravenous Q8H  . pantoprazole (PROTONIX) IV  40 mg Intravenous QHS  . sodium chloride flush  3 mL Intravenous Q12H  . sodium chloride flush  3 mL Intravenous Q12H  . thiamine injection  100 mg Intravenous Daily   Continuous: . sodium chloride    . feeding supplement (OSMOLITE 1.5 CAL) 1,000 mL (06/10/20 1818)  . heparin 800 Units/hr (06/11/20 0603)  . sodium chloride     AVW:UJWJXB chloride, acetaminophen **OR** acetaminophen, bisacodyl, chlorpheniramine-HYDROcodone, guaiFENesin-dextromethorphan, ondansetron **OR** ondansetron (ZOFRAN) IV, polyethylene glycol, polyvinyl alcohol, sodium chloride flush   Objective:  Vital Signs  Vitals:   06/11/20 0458 06/11/20 0500 06/11/20 0740 06/11/20 1159  BP: 121/64  119/68 131/70  Pulse: 97  95 96  Resp: 16  20 19   Temp: (!) 97.5 F (36.4 C)  99 F (37.2 C) 97.9 F (36.6 C)  TempSrc: Axillary  Axillary Axillary  SpO2: 95%  93% 96%  Weight:  69.9 kg    Height:        Intake/Output Summary (Last 24 hours) at 06/11/2020 1258 Last data filed at 06/11/2020 0601 Gross per 24 hour  Intake 2074.86 ml  Output 1075 ml  Net 999.86 ml   Filed Weights   06/08/20 0300 06/08/20 2031 06/11/20 0500  Weight: 69.1 kg 68.2 kg 69.9 kg   Is awake, follows simple commands he is with dense left-sided hemiparesis, able to squeeze right hand when asked  Symmetrical Chest wall movement, Good air movement bilaterally,  CTAB Irregular irregular,No Gallops,Rubs or new Murmurs, No Parasternal Heave +ve B.Sounds, Abd Soft, No tenderness, No rebound - guarding or rigidity. No Cyanosis, Clubbing or edema, No new Rash or bruise      Lab Results:  Data Reviewed: I have personally reviewed following labs and imaging studies  CBC: Recent Labs  Lab 06/05/20 0336 06/06/20 0432 06/07/20 0442 06/09/20 1045 06/10/20 0124  06/11/20 0451  WBC 12.5* 12.2* 10.9* 10.6* 14.0* 12.1*  NEUTROABS 11.4* 11.1*  --   --   --   --   HGB 13.2 13.3 14.4 16.0 16.2 14.7  HCT 38.5* 39.8 43.1 45.6 45.7 42.3  MCV 98.7 99.0 99.5 95.6 95.6 96.4  PLT 190 204 231 192 196 [id number]    Basic Metabolic Panel: Recent Labs  Lab 06/05/20 0336 06/06/20 0432 06/06/20 2358 06/09/20 1045 06/10/20 0124 06/11/20 0451  NA 146* 148* 147* 145 142 145  K 3.6 3.6 3.8 4.2 4.2 4.4  CL 110 111 112* 111 111 114*  CO2 25 25 26 23 22 24   GLUCOSE 145* 131* 133* 129* 153* 193*  BUN 45* 44* 45* 40* 41* 42*  CREATININE 0.84 0.75 0.76 0.64 0.73 0.76  CALCIUM 8.3* 8.1* 8.1* 7.9* 7.8* 7.7*  MG 2.6* 2.6*  --   --   --   --   PHOS 3.6 3.8  --   --   --   --     GFR: Estimated Creatinine Clearance: 67.7 mL/min (by C-G formula based on SCr of 0.76 mg/dL).  Liver Function Tests: Recent Labs  Lab 06/05/20 0336 06/06/20 0432 06/09/20 1045 06/09/20 1314 06/10/20 0124 06/11/20 0451  AST 26 31 121*  --  121* 87*  ALT 32 43 327*  --  351* 361*  ALKPHOS 108 94 75  --  73 76  BILITOT 1.1 1.4* 6.9* 7.0* 6.6* 2.6*  PROT 6.2* 6.1* 5.5*  --  5.4* 4.9*  ALBUMIN 2.8* 2.9* 2.8*  --  2.7* 2.4*     Coagulation Profile: Recent Labs  Lab 06/06/20 2358  INR 1.5*     CBG: Recent Labs  Lab 06/10/20 1934 06/10/20 2329 06/11/20 0338 06/11/20 0740 06/11/20 1158  GLUCAP 145* 120* 163* 130* 102*     Recent Results (from the past 240 hour(s))  MRSA PCR Screening     Status: None   Collection Time: 06/02/20  2:01 PM   Specimen: Nasopharyngeal   Result Value Ref Range Status   MRSA by PCR NEGATIVE NEGATIVE Final    Comment:        The GeneXpert MRSA Assay (FDA approved for NASAL specimens only), is one component of a comprehensive MRSA colonization surveillance program. It is not intended to diagnose MRSA infection nor to guide or monitor treatment for MRSA infections. Performed at [facility], [address], [place], [place] [address]       Radiology Studies: EEG  Result Date: 06/09/2020 Lora Havens, MD     06/09/2020  9:06 PM Patient Name: DERRYCK LONGER MRN: [id number] Epilepsy Attending: Lora Havens Referring Physician/Provider: Dr Lesleigh Noe Date: 06/09/2020 Duration: 26.29 mins Patient history: 86yo M with AMS. EEG to evaluate for seizure Level of alertness: Awake AEDs during EEG study: None Technical aspects: This EEG study was done with scalp electrodes positioned according to the 10-20 International system of electrode placement. Electrical activity was acquired at a sampling rate of 500Hz  and reviewed with a high frequency filter of 70Hz  and a low frequency filter of 1Hz . EEG data were recorded continuously and digitally stored. Description: No posterior dominant rhythm was seen. EEG showed continuous generalized 3 to 6 Hz theta-delta slowing. Hyperventilation and photic stimulation were not performed.   ABNORMALITY -Continuous slow, generalized IMPRESSION: This study is suggestive of moderate diffuse encephalopathy, nonspecific etiology. No seizures or epileptiform discharges were seen throughout the recording. [place]  CONTRAST  Addendum Date: 06/09/2020   ADDENDUM REPORT: 06/09/2020 15:59 ADDENDUM: These results were called by telephone at the time of interpretation on 06/09/2020 at 3:59 pm to provider [facility] , who verbally acknowledged these results. Electronically Signed   By: Franchot Gallo M.D.   On: 06/09/2020 15:59   Result Date: 06/09/2020 CLINICAL DATA:  Stroke.   COVID positive EXAM: MRI HEAD WITHOUT CONTRAST TECHNIQUE: Multiplanar, multiecho pulse sequences of the brain and surrounding structures were obtained without intravenous contrast. COMPARISON:  CT head 06/08/2020 FINDINGS: Brain: Moderately large right MCA infarct involving the insula, operculum, middle right frontal lobe. Mild amount of hemorrhage in the infarct. Additional small areas of acute infarct in the occipital lobes bilaterally. Ventricle size normal. Generalized atrophy. 3 mm midline shift to the left. Chronic microvascular ischemic changes in the white matter. Negative for mass lesion. Vascular: Normal arterial flow voids Skull and upper cervical spine: Negative Sinuses/Orbits: Mucosal edema and air-fluid levels in the paranasal sinuses. Right cataract extraction Other: None IMPRESSION: Acute infarct right MCA territory with local mass-effect and mild midline shift. Mild amount of hemorrhage in the infarct Small acute infarcts in the occipital lobe bilaterally. Electronically Signed: By: Franchot Gallo M.D. On: 06/09/2020 15:42   DG Abd Portable 1V  Result Date: 06/10/2020 CLINICAL DATA:  Nasogastric tube placement EXAM: PORTABLE ABDOMEN - 1 VIEW COMPARISON:  Portable exam 1213 hours compared to 06/09/2020 FINDINGS: Tip of nasogastric tube projects over mid stomach. Patchy infiltrates in the mid to lower lungs bilaterally. Visualized bowel gas pattern unremarkable. Bones demineralized with levoconvex thoracolumbar scoliosis. IMPRESSION: Tip of nasogastric tube projects over mid stomach. Electronically Signed   By: Lavonia Dana M.D.   On: 06/10/2020 12:28   DG Abd Portable 1V  Result Date: 06/09/2020 CLINICAL DATA:  Nasogastric placement EXAM: PORTABLE ABDOMEN - 1 VIEW COMPARISON:  None. FINDINGS: Orogastric or nasogastric tube enters the stomach with its tip in the fundus. Side hole is just beyond the gastroesophageal junction. Gas pattern otherwise unremarkable. IMPRESSION: Orogastric or  nasogastric tube tip in the fundus. Side hole just beyond the gastroesophageal junction. Electronically Signed   By: Nelson Chimes M.D.   On: 06/09/2020 19:01   US Abdomen Limited RUQ (LIVER/GB)  Result Date: 06/09/2020 CLINICAL DATA:  Elevated bilirubin levels. EXAM: ULTRASOUND ABDOMEN LIMITED RIGHT UPPER QUADRANT COMPARISON:  None. FINDINGS: Gallbladder: Gallbladder sludge. Stone within the neck of gallbladder measures 1.4 cm. No gallbladder wall thickening or pericholecystic fluid. Negative sonographic Murphy's sign. Common bile duct: Diameter: 3.9 mm Liver: Increased parenchymal echogenicity suggestive of hepatic steatosis. No focal liver lesion. Portal vein is patent on color Doppler imaging with normal direction of blood flow towards the liver. Other: None. IMPRESSION: 1. Gallbladder sludge and gallstone. No secondary signs of acute cholecystitis. 2. No evidence for biliary ductal dilatation. 3. Echogenic liver suggestive of hepatic steatosis. Electronically Signed   By: Kerby Moors M.D.   On: 06/09/2020 16:10       LOS: 10 days   Phillips Climes MD  [facility] Pager on [email or web address]  06/11/2020, 12:58 PM

## 2020-06-11 NOTE — Progress Notes (Signed)
Daily Progress Note   Patient Name: Joshua Robinson       Date: 06/11/2020 DOB: 04-18-1935  Age: 86 y.o. MRN#: [id number] Attending Physician: Albertine Patricia, MD Primary Care Physician: Redmond School, MD Admit Date: 06/01/2020  Reason for Consultation/Follow-up: Establishing goals of care  Subjective: Patient in bed- with mitts on, NG in place. PT and SLP notes reviewed.  Called patient's spouse for update and continued goals of care.  She noted that patient looked pitiful, she thought he had lost a great amount of weight.  She noted the sores on his nose from the BiPAP mask. (SLP kindly arranged video meeting). However, she was encouraged by the fact that he was able to recognize her and her children and could communicate more with her. She continued to iterates that she had faith that God would pull him through this.  She noted that her goals were to do what ever it takes to bring him home.   I discussed with her that at this point patient would not be able to return home and is possibly looking at indefinite skilled care.  At that point Mrs. Mensinger told me she refused to believe that and that God would heal her husband. I encouraged her to consider that while we do hope for the best, it is necessary to consider the what if"s this does not occur.  We discussed the possibility that he would need a permanent feeding tube placed if the goal continued to be aggressive medical care. CODE STATUS was also discussed-Mrs. Foree would like to talk to her children and resume discussion with me tomorrow.  Review of Systems  Unable to perform ROS: Acuity of condition    Length of Stay: 10  Current Medications: Scheduled Meds:  . Chlorhexidine Gluconate Cloth  6 each Topical Daily  .  feeding supplement (PROSource TF)  45 mL Per Tube BID  . folic acid  1 mg Intravenous Daily  . free water  100 mL Per Tube Q6H  . insulin aspart  0-9 Units Subcutaneous Q4H  . mouth rinse  15 mL Mouth Rinse BID  . methylPREDNISolone (SOLU-MEDROL) injection  40 mg Intravenous Q12H  . metoprolol tartrate  2.5 mg Intravenous Q8H  . pantoprazole (PROTONIX) IV  40 mg Intravenous QHS  . sodium chloride flush  3  mL Intravenous Q12H  . sodium chloride flush  3 mL Intravenous Q12H  . thiamine injection  100 mg Intravenous Daily    Continuous Infusions: . sodium chloride    . feeding supplement (OSMOLITE 1.5 CAL) 1,000 mL (06/10/20 1818)  . heparin 800 Units/hr (06/11/20 0603)  . sodium chloride      PRN Meds: sodium chloride, acetaminophen **OR** acetaminophen, bisacodyl, chlorpheniramine-HYDROcodone, guaiFENesin-dextromethorphan, ondansetron **OR** ondansetron (ZOFRAN) IV, polyethylene glycol, polyvinyl alcohol, sodium chloride flush  Physical Exam          Vital Signs: BP 131/70 (BP Location: Left Arm)   Pulse 96   Temp 97.9 F (36.6 C) (Axillary)   Resp 19   Ht 5\' 8"  (1.727 m)   Wt 69.9 kg   SpO2 96%   BMI 23.43 kg/m  SpO2: SpO2: 96 % O2 Device: O2 Device: Room Air O2 Flow Rate: O2 Flow Rate (L/min): 1 L/min  Intake/output summary:   Intake/Output Summary (Last 24 hours) at 06/11/2020 1400 Last data filed at 06/11/2020 0601 Gross per 24 hour  Intake 2074.86 ml  Output 1075 ml  Net 999.86 ml   LBM: Last BM Date: 06/11/19 Baseline Weight: Weight: 74.8 kg Most recent weight: Weight: 69.9 kg       Palliative Assessment/Data:    Flowsheet Rows   Flowsheet Row Most Recent Value  Intake Tab   Referral Department Hospitalist  Unit at Time of Referral ICU  Palliative Care Primary Diagnosis Pulmonary  Date Notified 06/03/20  Palliative Care Type New Palliative care  Reason for referral Clarify Goals of Care  Date of Admission 06/01/20  Date first seen by Palliative  Care 06/03/20  # of days Palliative referral response time 0 Day(s)  # of days IP prior to Palliative referral 2  Clinical Assessment   Psychosocial & Spiritual Assessment   Palliative Care Outcomes       Patient Active Problem List   Diagnosis Date Noted  . Cerebral embolism with cerebral infarction 06/09/2020  . Atrial fibrillation with RVR ([place]) 06/02/2020  . Acute respiratory disease due to COVID-19 virus 06/01/2020  . Acute respiratory failure with hypoxia Due to Covid PNA 06/01/2020  . Pneumonia due to COVID-19 virus 06/01/2020  . Postoperative anemia due to acute blood loss 03/02/2013  . Hyponatremia 03/02/2013  . OA (osteoarthritis) of knee 02/19/2013    Palliative Care Assessment & Plan   Patient Profile: Per H&P---"86 year old male with no documented chronic medical problems, but is a reformed smoker of 50 pack years presenting with myalgias, coughing, and shortness of breath that began on 05/24/2020.  The patient states that he visited his PCP on a virtual visit on 05/27/2019.  He was given oral steroids and azithromycin.  His symptoms did not improve.  He continued to have worsening shortness of breath, coughing.  He did have some intermittent chest discomfort with coughing but denied any hemoptysis.  He had some loose stools but did not have any hematochezia or melena.  He also had subjective fevers and chills at home.  He has had 2 doses of the Moderna vaccination but has not been boosted.  EMS was activated and the patient was noted to have oxygen saturation 67% on room air.  He was placed on CPAP and brought to the hospital.  In the emergency department, the patient was subsequently placed on high flow nasal cannula.  He did not tolerate it well and was placed on BiPAP.  He has been subsequently transitioned to heated high flow  at 40 L.  The patient was given Actemra and started on intravenous steroids.  In addition, the patient was noted to have atrial fibrillation with RVR.  He  was started on a diltiazem drip. New onset of LUE/LLE hemiplegia due to recent right MCA sroke" Palliative medicine consulted for [place]. Family has continued to express desire for full aggressive care.   Assessment/Recommendations/Plan   Continue current scope of care  PMT will follow up tomorrow with patient's spouse per her request  Goals of Care and Additional Recommendations:  Limitations on Scope of Treatment: Full Scope Treatment  Code Status:  DNR  Prognosis:   Unable to determine  Discharge Planning:  To Be Determined  Care plan was discussed with patient's spouse and care team  Thank you for allowing the Palliative Medicine Team to assist in the care of this patient.   Total time: 38 mins Greater than 50%  of this time was spent counseling and coordinating care related to the above assessment and plan.  Mariana Kaufman, AGNP-C Palliative Medicine   Please contact Palliative Medicine Team phone at [phone] for questions and concerns.

## 2020-06-11 NOTE — Progress Notes (Signed)
  Speech Language Pathology Treatment: Dysphagia;Cognitive-Linquistic  Patient Details Name: Joshua Robinson MRN: [id number] DOB: [birth year] Today's Date: 06/11/2020 Time: 5427-0623 SLP Time Calculation (min) (ACUTE ONLY): 43 min  Assessment / Plan / Recommendation Clinical Impression  Pt alert and participatory, after oral care, able to attempt ice chip manipulation. Labial seal sustained and improving but even when actively attempting posterior transit and swallow initiation achieved there is severe oral residue and eventual anterior spillage. Pt observe to have poor secretion management when not in therapy with we to vocal quality and weak cough, at high risk of aspiration of secretions. Recommend pt be allowed ice chips after oral care despite severity of dysphagia to facilitate healing of lingual mucosa. Pt will not have any improvement until lingual surface is at least functional.  Facilitated functional communication with cues for clear speech during visit with MD and during hone call and face time with wife. Pt communicating in clear phrases to wife, occasionally needing cues to repeat due to low volume. Pt struggling to sustain gaze at midline for more than 5 seconds. Will continue efforts. Discussed severity of dysphagia with MD, neurologist and pt/family.   HPI HPI: 86 year old male with no documented chronic medical problems, but is a reformed smoker of 50 pack years presenting with myalgias, coughing, and shortness of breath that began on 05/24/2020.  The patient states that he visited his PCP on a virtual visit on 05/27/2019.  He was given oral steroids and azithromycin.  His symptoms did not improve.  He continued to have worsening shortness of breath, coughing.  He did have some intermittent chest discomfort with coughing but denied any hemoptysis.  He had some loose stools but did not have any hematochezia or melena.  He also had subjective fevers and chills at home.  He has had 2 doses of  the Moderna vaccination but has not been boosted.  EMS was activated and the patient was noted to have oxygen saturation 67% on room air.  He was placed on CPAP and brought to the hospital.  In the emergency department, the patient was subsequently placed on high flow nasal cannula.  He did not tolerate it well and was placed on BiPAP.  He has been subsequently transitioned to heated high flow at 40 L. CT showed Evolving cytotoxic edema involving the right insula and overlying right frontal lobe, consistent with acute to early subacute right  MCA distribution infarct.. Pt is tolerating 10 L HFNC today. BSE requested      SLP Plan  Continue with current plan of care       Recommendations  Diet recommendations: NPO;Other(comment) (except for ice chips)                Oral Care Recommendations: Oral care QID;Oral care prior to ice chip/H20 Follow up Recommendations: 24 hour supervision/assistance;Home health SLP (family reports wanting home care) Plan: Continue with current plan of care       GO                Ariea Rochin, Katherene Ponto 06/11/2020, 1:43 PM

## 2020-06-11 NOTE — Progress Notes (Addendum)
STROKE TEAM PROGRESS NOTE   INTERVAL HISTORY No acute events in past 24 hours.   Heparin drip started yesterday.  Patient is up on side of bed working with therapy team with good participation. He denies complaints or concerns today.   Vitals:   06/10/20 2340 06/11/20 0458 06/11/20 0500 06/11/20 0740  BP: 111/62 121/64  119/68  Pulse: 77 97  95  Resp: 19 16  20   Temp: 98 F (36.7 C) (!) 97.5 F (36.4 C)  99 F (37.2 C)  TempSrc: Axillary Axillary  Axillary  SpO2: 95% 95%  93%  Weight:   69.9 kg   Height:       CBC:  Recent Labs  Lab 06/05/20 0336 06/06/20 0432 06/07/20 0442 06/10/20 0124 06/11/20 0451  WBC 12.5* 12.2*   < > 14.0* 12.1*  NEUTROABS 11.4* 11.1*  --   --   --   HGB 13.2 13.3   < > 16.2 14.7  HCT 38.5* 39.8   < > 45.7 42.3  MCV 98.7 99.0   < > 95.6 96.4  PLT 190 204   < > 196 165   < > = values in this interval not displayed.   Basic Metabolic Panel:  Recent Labs  Lab 06/05/20 0336 06/06/20 0432 06/06/20 2358 06/10/20 0124 06/11/20 0451  NA 146* 148*   < > 142 145  K 3.6 3.6   < > 4.2 4.4  CL 110 111   < > 111 114*  CO2 25 25   < > 22 24  GLUCOSE 145* 131*   < > 153* 193*  BUN 45* 44*   < > 41* 42*  CREATININE 0.84 0.75   < > 0.73 0.76  CALCIUM 8.3* 8.1*   < > 7.8* 7.7*  MG 2.6* 2.6*  --   --   --   PHOS 3.6 3.8  --   --   --    < > = values in this interval not displayed.   Lipid Panel:  Recent Labs  Lab 06/07/20 0442  CHOL 224*  TRIG 161*  HDL 45  CHOLHDL 5.0  VLDL 32  LDLCALC 147*   HgbA1c:  Recent Labs  Lab 06/07/20 0442  HGBA1C 5.2   Urine Drug Screen: No results for input(s): LABOPIA, COCAINSCRNUR, LABBENZ, AMPHETMU, THCU, LABBARB in the last 168 hours.  Alcohol Level No results for input(s): ETH in the last 168 hours.  IMAGING past 24 hours DG Abd Portable 1V  Result Date: 06/10/2020 CLINICAL DATA:  Nasogastric tube placement EXAM: PORTABLE ABDOMEN - 1 VIEW COMPARISON:  Portable exam 1213 hours compared to 06/09/2020  FINDINGS: Tip of nasogastric tube projects over mid stomach. Patchy infiltrates in the mid to lower lungs bilaterally. Visualized bowel gas pattern unremarkable. Bones demineralized with levoconvex thoracolumbar scoliosis. IMPRESSION: Tip of nasogastric tube projects over mid stomach. Electronically Signed   By: Lavonia Dana M.D.   On: 06/10/2020 12:28    PHYSICAL EXAM Constitutional: Frail elderly male sitting up on side of bed working with therapists x2.  Psych: Calm and cooperative.  Cardiovascular: Irregular rate and rhythm on cardiac monitor Resp: Tachypnea noted, some audible congestion heard, weak cough, oxygen in use GI: Soft.  No distension. There is no tenderness.  Skin: small dried scab on bridge of nose; from BiPAP per chart review. No active hemorrhage.   Neuro: Mental Status: Patient is awake, alert, interactive and following commands well for therapists.   Speech is dysarthric, aphasic. Able to  state name. He speaks with one word communication with delayed responses.  Does appear to have some neglect to the left. Impaired gaze to left.  Cranial Nerves: II: Visual fields are full. Blink to threat present. Pupils are equal, round, and reactive to light 3 mm/brisk. III,IV, VI: Patient able to look fully downward and towards the right, only slightly crosses midline towards the left. Conservation officer, nature. No ptosis or diplopia.  V: Facial sensation is symmetric to light touch VII: Facial movement is asymmetric with left-sided weakness. VIII: Hearing is intact to voice X: Palate elevates symmetrically XI: Shoulder shrug asymmetric, stronger on the right.  XII: Tongue appears to protrude midline but limited due to patient weakness and mouth dryness; only partial protrusion Motor: Tone is normal. Bulk is normal. Right hemibody 4/5 strength  Left upper extremity fairly nearly flaccid, trace movement, left lower extremity flaccid.  Sensory: Sensation is symmetric to light touch and  temperature in the arms and legs.    Lateral lean to left in sitting position   ASSESSMENT/PLAN 86 year old male without documented medical history on no chronic home medications but with a 50-pack-year smoking history who presented to [facility] on 1/16 with  shortness of breath (O2 saturation 67%), fevers and chills and was found to be COVID positive.  He also developed atrial fibrillation with RVR. He required BiPAP and diltiazem drip.  Patient was found on 06/06/2020 in the evening with acute onset left-sided hemiparesis, aphasia, and left facial droop prompting stat CODE STROKE CT imaging. CT imaging found a right MCA distribution infarct, a hyperdense vessel at the base of the right sylvian fissure, possibly reflecting a proximal M2 thrombus, and underlying atrophy with chronic small vessel ischemic disease and was then transferred to [facility] on 06/08/20 for further management  Stroke - Right MCA large stroke as well as b/l MCA/PCA punctate infarcts, likely cardioembolic in the setting of new onset Afib RVR and COVID hypercoagulability   CT head 1/21: Evolving cytotoxic edema involving the right insula and overlyingright frontal lobe, consistent with acute to early subacute right MCA distribution infarct.  CTA head & neck: Acute right M2 branch occlusion, superior division  MRI:  Acute infarct right MCA territory with local mass-effect and mild midline shift. Mild amount of hemorrhage in the infarct  CT head repeated 1/23 showing stable size of the right MCA infarct seen previously. No evidence of hemorrhagic transformation  Carotid Doppler:  Minor carotid atherosclerosis.   2D Echo: EF 55-60%   EEG no seizure  LDL 147   HgbA1c 5.2  VTE prophylaxis - Heparin IV.   No daily medications of any kind prior to admission, was on lovenox bid once admitted, now on heparin IV. OK to transition to po once no more planned procedures  Therapy recommendations:  SNF at present    Disposition:  TBD  Atrial Friblliation with RVR  New diagnosis in the setting of COVID  Was on cardizem IV -> metoprolol  Has tolerated therapeutic Lovenox x 6 days -> heparin drip. OK to transition to po once no more planned procedures  Primary team managing IV metoprolol  Rate controlled  Telemetry monitoring ongoing  Acute metabolic encephalopathy, improved  HCT repeated 1/23 without new findings.   ABG 1/24 without hypercapnia.   Ammonia level minimally elevated at 44 on 1/24 .   UA negative for infection 1/24.   CXR shows some improvement in infiltrates 1/24.    Elevated AST ALT amd bilirubin levels  RUQ [place] without  evidence of cholecystitis or biliary ductal dilatation. It was suggestive of hepatic steatosis.   EEG showed no seizure activity.   Acute respiratory failure with hypoxia secondary to COVID 19 COVID pneumonia  Off BIPAP and tolerating nasal cannula.   Afebrile.   Holding off on antibiotics for now.   On solumedrol  WBC 14->12.1, Tmax 99  Management per primary team and pulmonary consultant.   Blood pressure management  No history of HTN . BP stable . Long-term BP goal normotensive  Hyperlipidemia  Home meds - none  LDL 147, goal < 70  Statin on hold due to elevated LFTs  AST/ALT 121/327->121/351->87/361  Continue statin at discharge once LFTs normalized and po access  Dysphagia  N.p.o. currently  On tube feedings  IV fluid  Speech on board  May need PEG for temporary nutrition   Other Stroke Risk Factors  Advanced Age >/= 58   Former smoker: reports that he quit smoking about 24 years ago. His smoking use included cigarettes. He quit after 40.00 years of use. His smokeless tobacco use includes snuff.    Alcohol use - on FA/B1  Other Active Problems   Palliative care is following.  Patient's family currently desires full scope of care. Prognosis however remains guarded to poor per primary team  Sick thyroid  - TSH 0.124, Free T4 1.60   This plan of care was discussed with and directed by Dr. Erlinda Hong.  Hetty Blend, NP-C  Hospital day # 10  ATTENDING NOTE: I reviewed above note and agree with the assessment and plan. Pt was seen and examined.   Speech therapist at bedside.  Patient reclining in bed, lethargic however, more interactive than yesterday.  Although severe dysarthria, however able to answer orientation questions, orientated to his name, age and place and months.  Able to repeat but did not name.  Follows simple commands.  Still has left facial droop and left hemiplegia.  Currently on core track did not pass a swallow.  We discussed about temporary PEG tube for nutrition while he is working with speech therapist, patient is in agreement.  Discussed with Dr. Waldron Labs, may consider IR consult for PEG tube placement.  Currently patient on heparin IV, may consider switch to p.o. once no more procedures.  LFTs improving/stable, may consider statin once LFTs normalize.  Continue PT/OT/speech.  PT/OT recommend SNF.  Neurology will sign off. Please call with questions. Pt will follow up with stroke clinic NP at [facility] in about 4 weeks. Thanks for the consult.   Rosalin Hawking, MD PhD Stroke Neurology 06/11/2020 4:22 PM    To contact Stroke Continuity provider, please refer to [email or web address] After hours, contact General Neurology

## 2020-06-11 NOTE — Progress Notes (Signed)
ANTICOAGULATION CONSULT NOTE - Follow Up Consult  Pharmacy Consult for IV Heparin Indication: Atrial fibrillation  Labs: Recent Labs    06/09/20 1045 06/10/20 0124 06/10/20 1937 06/11/20 0451 06/11/20 1317  HGB 16.0 16.2  --  14.7  --   HCT 45.6 45.7  --  42.3  --   PLT 192 196  --  165  --   HEPARINUNFRC  --   --  0.57 0.56 0.50  CREATININE 0.64 0.73  --  0.76  --      Assessment: 86 yr old male with no significant medical history known presented with COVID, then developed a new embolic stroke and atrial fibrillation. Pharmacy was consulted for heparin dosing/monitoring; discussed with primary team and Dr. Erlinda Hong; will start stroke protocol due to stroke diagnosis (low goal and no heparin boluses).   Patient was on therapeutic enoxaparin until yesterday; last dose in AM 1/24. Heparin level this afternoon 0.5 units/ml  Goal of Therapy:  Heparin level: 0.3-0.5 units/ml Monitor platelets by anticoagulation protocol: Yes   Plan:  Continue heparin infusion at 800 units/hr Monitor daily heparin level, CBC Monitor for bleeding  Dwayne A. Levada Dy, PharmD, BCPS, FNKF Clinical Pharmacist [place] Please utilize Amion for appropriate phone number to reach the unit pharmacist ([place])   06/11/2020,2:20 PM

## 2020-06-11 NOTE — Progress Notes (Signed)
ANTICOAGULATION CONSULT NOTE - Follow Up Consult  Pharmacy Consult for IV Heparin Indication: Atrial fibrillation  Labs: Recent Labs    06/09/20 1045 06/10/20 0124 06/10/20 1937 06/11/20 0451  HGB 16.0 16.2  --  14.7  HCT 45.6 45.7  --  42.3  PLT 192 196  --  165  HEPARINUNFRC  --   --  0.57 0.56  CREATININE 0.64 0.73  --  0.76     Assessment: 86 yr old male with no significant medical history known presented with COVID, then developed a new embolic stroke and atrial fibrillation. Pharmacy was consulted for heparin dosing/monitoring; discussed with primary team and Dr. Erlinda Hong; will start stroke protocol due to stroke diagnosis (low goal and no heparin boluses).   Patient was on therapeutic enoxaparin until yesterday; last dose in AM 1/24. Heparin level this am 0.56 units/ml  Goal of Therapy:  Heparin level: 0.3-0.5 units/ml Monitor platelets by anticoagulation protocol: Yes   Plan:  Decrease heparin infusion to 800 units/hr Monitor daily heparin level, CBC Monitor for bleeding  Thanks for allowing pharmacy to be a part of this patient's care.  Excell Seltzer, PharmD Clinical Pharmacist 06/11/2020,6:00 AM

## 2020-06-11 NOTE — Progress Notes (Signed)
Occupational Therapy Treatment Patient Details Name: Joshua Robinson MRN: [id number] DOB: 1934/07/07 Today's Date: 06/11/2020    History of present illness 86 year old male with no documented chronic medical problems, but is a reformed smoker of 50 pack years presenting with myalgias, coughing, and shortness of breath that began on 05/24/2020.  The patient states that he visited his PCP on a virtual visit on 05/27/2019.  He was given oral steroids and azithromycin.  His symptoms did not improve.  He continued to have worsening shortness of breath, coughing.  He did have some intermittent chest discomfort with coughing but denied any hemoptysis.  He had some loose stools but did not have any hematochezia or melena.  He also had subjective fevers and chills at home.  He has had 2 doses of the Moderna vaccination but has not been boosted.  EMS was activated and the patient was noted to have oxygen saturation 67% on room air.  He was placed on CPAP and brought to the hospital.  In the emergency department, the patient was subsequently placed on high flow nasal cannula.  He did not tolerate it well and was placed on BiPAP.  He has been subsequently transitioned to heated high flow at 40 L.  The patient was given Actemra and started on intravenous steroids.  In addition, the patient was noted to have atrial fibrillation with RVR.  He was started on a diltiazem drip. New onset of LUE/LLE hemiplegia due to recent right MCA sroke   OT comments  Pt seen in conjunction with PT today to progress to EOB. Pt tolerating sitting EOB approx 10-12 min. He continues to require overall totalA(+2) for completion of bed mobility and to maintain static balance EOB, but overall tolerating activity well today. While seated EOB pt engaging in various activities including reaching (using RUE) and facilitating weightbearing through LUE. Pt remains with notable L side weakness and L neglect, demonstrating ability to track towards  midline but unable to track past midline at this time. VSS throughout on RA. Will continue per POC at this time.    Follow Up Recommendations  SNF;Supervision/Assistance - 24 hour    Equipment Recommendations  3 in 1 bedside commode    Recommendations for Other Services Other (comment) (palliative)    Precautions / Restrictions Precautions Precautions: Fall;Other (comment) Precaution Comments: Cortrak, fragile skin, L hemi Restrictions Weight Bearing Restrictions: No       Mobility Bed Mobility Overal bed mobility: Needs Assistance Bed Mobility: Supine to Sit;Sit to Supine     Supine to sit: +2 for physical assistance;Total assist Sit to supine: Total assist;+2 for physical assistance   General bed mobility comments: active engagement for initiation of mobility noted, assist for all aspects of mobility, use of bed pads to scoot to EOB  Transfers                 General transfer comment: will need +2; lift equipment (maximove)    Balance Overall balance assessment: Needs assistance Sitting-balance support: Single extremity supported;Feet supported Sitting balance-Leahy Scale: Zero Sitting balance - Comments: total assist to maintain sitting balance EOB. Pt tolerated EOB x 10-12 minutes. Postural control: Posterior lean;Left lateral lean                                 ADL either performed or assessed with clinical judgement   ADL Overall ADL's : Needs assistance/impaired  Functional mobility during ADLs: Total assistance;+2 for physical assistance;+2 for safety/equipment (bed mobility) General ADL Comments: pt tolerating sitting EOB approx 10 min while working with PT/OT                       Cognition Arousal/Alertness: Awake/alert Behavior During Therapy: Flat affect Overall Cognitive Status: Difficult to assess Area of Impairment: Attention;Awareness;Problem  solving;Safety/judgement                   Current Attention Level: Sustained     Safety/Judgement: Decreased awareness of safety;Decreased awareness of deficits Awareness: Emergent Problem Solving: Slow processing;Decreased initiation;Difficulty sequencing;Requires verbal cues;Requires tactile cues General Comments: L neglect, consistently following one-step commands, answering simple yes/no questions appropriately        Exercises Exercises: Other exercises Other Exercises Other Exercises: gentle PROM to LUE Other Exercises: working on cervical ROM/stretching and scapular retraction/stretch while seated EOB Other Exercises: working on wt shifting and WB onto L elbow Other Exercises: reaching activity using RUE towards R visual field and midline (attempting L visual field but with increased difficulty tracking)   Shoulder Instructions       General Comments VSS on RA, attempted to call spouse during session but with no answer    Pertinent Vitals/ Pain       Pain Assessment: Faces Faces Pain Scale: No hurt Pain Intervention(s): Monitored during session  Home Living                                          Prior Functioning/Environment              Frequency  Min 2X/week        Progress Toward Goals  OT Goals(current goals can now be found in the care plan section)  Progress towards OT goals: Progressing toward goals  Acute Rehab OT Goals Patient Stated Goal: Per wife for pt to get stronger and to be able to bring him home OT Goal Formulation: With patient/family Time For Goal Achievement: 06/23/20 Potential to Achieve Goals: Fair ADL Goals Pt Will Perform Grooming: with mod assist;sitting Pt Will Perform Upper Body Bathing: with mod assist;sitting Additional ADL Goal #1: Pt will maintain midlein postural control EOB with Mod A +2 Additional ADL Goal #2: Pt will identify 3/5 objects in L visual field with multimodal cues  Plan  Discharge plan remains appropriate    Co-evaluation    PT/OT/SLP Co-Evaluation/Treatment: Yes Reason for Co-Treatment: Complexity of the patient's impairments (multi-system involvement);For patient/therapist safety;To address functional/ADL transfers PT goals addressed during session: Mobility/safety with mobility;Balance OT goals addressed during session: ADL's and self-care;Strengthening/ROM      AM-PAC OT "6 Clicks" Daily Activity     Outcome Measure   Help from another person eating meals?: Total Help from another person taking care of personal grooming?: A Lot Help from another person toileting, which includes using toliet, bedpan, or urinal?: Total Help from another person bathing (including washing, rinsing, drying)?: Total Help from another person to put on and taking off regular upper body clothing?: Total Help from another person to put on and taking off regular lower body clothing?: Total 6 Click Score: 7    End of Session    OT Visit Diagnosis: Unsteadiness on feet (R26.81);Other abnormalities of gait and mobility (R26.89);Muscle weakness (generalized) (M62.81);Low vision, both eyes (H54.2);Other symptoms and signs involving cognitive function;Hemiplegia and  hemiparesis Hemiplegia - Right/Left: Left Hemiplegia - dominant/non-dominant: Non-Dominant Hemiplegia - caused by: Cerebral infarction   Activity Tolerance Patient tolerated treatment well   Patient Left in bed;with call bell/phone within reach;with bed alarm set;with restraints reapplied   Nurse Communication Mobility status        Time: 2423-5361 OT Time Calculation (min): 23 min  Charges: OT General Charges $OT Visit: 1 Visit OT Treatments $Therapeutic Activity: 8-22 mins  Lou Cal, OT Acute Rehabilitation Services Pager [phone] Office [phone]    Raymondo Band 06/11/2020, 12:47 PM

## 2020-06-11 NOTE — Procedures (Signed)
Cortrak  Person Inserting Tube:  King, Larue Lightner E, RD Tube Type:  Cortrak - 43 inches Tube Location:  Right nare Initial Placement:  Stomach Secured by: Bridle Technique Used to Measure Tube Placement:  Documented cm marking at nare/ corner of mouth Cortrak Secured At:  69 cm    Cortrak Tube Team Note:  Consult received to place a Cortrak feeding tube.   No x-ray is required. RN may begin using tube.   If the tube becomes dislodged please keep the tube and contact the Cortrak team at [email or web address] (password [email or web address]) for replacement.  If after hours and replacement cannot be delayed, place a NG tube and confirm placement with an abdominal x-ray.    Danen Lapaglia King, MS, RD, LDN Pager number available on Amion 

## 2020-06-12 DIAGNOSIS — J9601 Acute respiratory failure with hypoxia: Secondary | ICD-10-CM | POA: Diagnosis not present

## 2020-06-12 DIAGNOSIS — Z7189 Other specified counseling: Secondary | ICD-10-CM | POA: Diagnosis not present

## 2020-06-12 DIAGNOSIS — I4891 Unspecified atrial fibrillation: Secondary | ICD-10-CM | POA: Diagnosis not present

## 2020-06-12 LAB — CBC
HCT: 45 % (ref 39.0–52.0)
Hemoglobin: 15.6 g/dL (ref 13.0–17.0)
MCH: 33.9 pg (ref 26.0–34.0)
MCHC: 34.7 g/dL (ref 30.0–36.0)
MCV: 97.8 fL (ref 80.0–100.0)
Platelets: 152 10*3/uL (ref 150–400)
RBC: 4.6 MIL/uL (ref 4.22–5.81)
RDW: 13.7 % (ref 11.5–15.5)
WBC: 16.3 10*3/uL — ABNORMAL HIGH (ref 4.0–10.5)
nRBC: 0 % (ref 0.0–0.2)

## 2020-06-12 LAB — COMPREHENSIVE METABOLIC PANEL
ALT: 317 U/L — ABNORMAL HIGH (ref 0–44)
AST: 64 U/L — ABNORMAL HIGH (ref 15–41)
Albumin: 2.7 g/dL — ABNORMAL LOW (ref 3.5–5.0)
Alkaline Phosphatase: 76 U/L (ref 38–126)
Anion gap: 9 (ref 5–15)
BUN: 49 mg/dL — ABNORMAL HIGH (ref 8–23)
CO2: 24 mmol/L (ref 22–32)
Calcium: 7.9 mg/dL — ABNORMAL LOW (ref 8.9–10.3)
Chloride: 113 mmol/L — ABNORMAL HIGH (ref 98–111)
Creatinine, Ser: 0.82 mg/dL (ref 0.61–1.24)
GFR, Estimated: >60 mL/min (ref >60)
Glucose, Bld: 140 mg/dL — ABNORMAL HIGH (ref 70–99)
Potassium: 4.8 mmol/L (ref 3.5–5.1)
Sodium: 146 mmol/L — ABNORMAL HIGH (ref 135–145)
Total Bilirubin: 2.5 mg/dL — ABNORMAL HIGH (ref 0.3–1.2)
Total Protein: 5.3 g/dL — ABNORMAL LOW (ref 6.5–8.1)

## 2020-06-12 LAB — HEPARIN LEVEL (UNFRACTIONATED)
Heparin Unfractionated: 0.65 IU/mL (ref 0.30–0.70)
Heparin Unfractionated: 0.41 IU/mL (ref 0.30–0.70)

## 2020-06-12 LAB — GLUCOSE, CAPILLARY
Glucose-Capillary: 134 mg/dL — ABNORMAL HIGH (ref 70–99)
Glucose-Capillary: 99 mg/dL (ref 70–99)
Glucose-Capillary: 99 mg/dL (ref 70–99)
Glucose-Capillary: 105 mg/dL — ABNORMAL HIGH (ref 70–99)

## 2020-06-12 MED ORDER — ONDANSETRON HCL 4 MG/2ML IJ SOLN: 4.0000 mg | Freq: Four times a day (QID) | INTRAMUSCULAR | Status: DC | PRN

## 2020-06-12 MED ORDER — MAGIC MOUTHWASH
10.0000 mL | Freq: Four times a day (QID) | ORAL | Status: DC
  Administered 2020-06-12 – 2020-06-18 (×21): 10 mL via ORAL
  Filled 2020-06-12 (×26): qty 10

## 2020-06-12 MED ORDER — POLYETHYLENE GLYCOL 3350 17 G PO PACK: 17.0000 g | PACK | Freq: Every day | ORAL | Status: DC | PRN

## 2020-06-12 MED ORDER — ONDANSETRON HCL 4 MG PO TABS: 4.0000 mg | ORAL_TABLET | Freq: Four times a day (QID) | ORAL | Status: DC | PRN

## 2020-06-12 MED ORDER — ACETAMINOPHEN 325 MG PO TABS: 650.0000 mg | ORAL_TABLET | Freq: Four times a day (QID) | ORAL | Status: DC | PRN

## 2020-06-12 MED ORDER — HYDROCOD POLST-CPM POLST ER 10-8 MG/5ML PO SUER: 5.0000 mL | Freq: Two times a day (BID) | ORAL | Status: DC | PRN

## 2020-06-12 MED ORDER — GUAIFENESIN-DM 100-10 MG/5ML PO SYRP: 10.0000 mL | ORAL_SOLUTION | ORAL | Status: DC | PRN

## 2020-06-12 MED ORDER — MAGIC MOUTHWASH: 10.0000 mL | Freq: Four times a day (QID) | ORAL | Status: DC

## 2020-06-12 MED ORDER — NYSTATIN 100000 UNIT/ML MT SUSP: 5.0000 mL | Freq: Four times a day (QID) | OROMUCOSAL | Status: DC

## 2020-06-12 MED ORDER — FLUCONAZOLE 100 MG PO TABS
100.0000 mg | ORAL_TABLET | Freq: Every day | ORAL | Status: DC
  Administered 2020-06-12 – 2020-06-13 (×2): 100 mg
  Filled 2020-06-12 (×2): qty 1

## 2020-06-12 MED ORDER — ACETAMINOPHEN 650 MG RE SUPP: 650.0000 mg | Freq: Four times a day (QID) | RECTAL | Status: DC | PRN

## 2020-06-12 NOTE — Progress Notes (Signed)
ANTICOAGULATION CONSULT NOTE - Follow Up Consult  Pharmacy Consult for IV Heparin Indication: Atrial fibrillation  Labs: Recent Labs    06/10/20 0124 06/10/20 1937 06/11/20 0451 06/11/20 1317 06/12/20 0111  HGB 16.2  --  14.7  --  15.6  HCT 45.7  --  42.3  --  45.0  PLT 196  --  165  --  152  HEPARINUNFRC  --    < > 0.56 0.50 0.65  CREATININE 0.73  --  0.76  --  0.82   < > = values in this interval not displayed.     Assessment: 86 yr old male with no significant medical history known presented with COVID, then developed a new embolic stroke and atrial fibrillation. Pharmacy was consulted for heparin dosing/monitoring; discussed with primary team and Dr. Erlinda Hong; will start stroke protocol due to stroke diagnosis (low goal and no heparin boluses).   Patient was on therapeutic enoxaparin; last dose in AM 1/24. Heparin level this AM 0.65 units/ml  Goal of Therapy:  Heparin level: 0.3-0.5 units/ml Monitor platelets by anticoagulation protocol: Yes   Plan:  Decrease heparin infusion to 700 units/hr HL in 8 hours Monitor daily heparin level, CBC Monitor for bleeding  Joshua Robinson A. Levada Dy, PharmD, BCPS, FNKF Clinical Pharmacist [place] Please utilize Amion for appropriate phone number to reach the unit pharmacist ([place])   06/12/2020,8:20 AM

## 2020-06-12 NOTE — Progress Notes (Signed)
PROGRESS NOTE  Joshua Robinson NWG:[id number] DOB: [birth year] DOA: 06/01/2020  PCP: Redmond School, MD  Brief History/Interval Summary:  - 86 year-old male with no documented chronic medical problems, but is a reformed smoker of 50 pack years presenting with myalgias, coughing, and shortness of breath that began on 05/24/2020.The patient states that he visited his PCP on a virtual visit on 05/27/2019. He was given oral steroids and azithromycin. His symptoms did not improve. He continued to have worsening shortness of breath, coughing. He has had 2 doses of the Moderna vaccination but has not been boosted.  Found to be positive for COVID-19. In the emergency department, the patient was subsequently placed on high flow nasal cannula. He did not tolerate it well and was placed on BiPAP. He has been subsequently transitioned to heated high flow.  Hospitalized to the intensive care unit and independent.  Respiratory status started improving however patient then developed embolic stroke.  Subsequently transferred to [facility].  Reason for Visit: Pneumonia due to COVID-19.  Acute respiratory failure with hypoxia.  Acute embolic stroke.  Consultants: Pulmonology.  Neurology  Procedures: None yet  Antibiotics: Anti-infectives (From admission, onward)   Start     Dose/Rate Route Frequency Ordered Stop   06/12/20 1600  fluconazole (DIFLUCAN) tablet 100 mg        100 mg Per Tube Daily 06/12/20 1507 06/15/20 0959   06/02/20 1000  cefTRIAXone (ROCEPHIN) 2 g in sodium chloride 0.9 % 100 mL IVPB  Status:  Discontinued        2 g 200 mL/hr over 30 Minutes Intravenous Every 24 hours 06/02/20 0638 06/04/20 1004   06/02/20 1000  azithromycin (ZITHROMAX) 500 mg in sodium chloride 0.9 % 250 mL IVPB  Status:  Discontinued        500 mg 250 mL/hr over 60 Minutes Intravenous Every 24 hours 06/02/20 0638 06/04/20 1004   06/02/20 0600  vancomycin (VANCOREADY) IVPB 750 mg/150 mL  Status:   Discontinued        750 mg 150 mL/hr over 60 Minutes Intravenous Every 12 hours 06/01/20 1333 06/02/20 1136   06/01/20 1245  vancomycin (VANCOREADY) IVPB 1500 mg/300 mL        1,500 mg 150 mL/hr over 120 Minutes Intravenous  Once 06/01/20 1239 06/01/20 1627   06/01/20 1230  cefTRIAXone (ROCEPHIN) 1 g in sodium chloride 0.9 % 100 mL IVPB        1 g 200 mL/hr over 30 Minutes Intravenous  Once 06/01/20 1223 06/01/20 1337   06/01/20 1230  azithromycin (ZITHROMAX) 500 mg in sodium chloride 0.9 % 250 mL IVPB        500 mg 250 mL/hr over 60 Minutes Intravenous  Once 06/01/20 1223 06/01/20 1425      Subjective/Interval History:  There is no significant events overnight as discussed with staff .  He denies any abdominal pain, nausea, Foley catheter discontinued yesterday with no retention overnight.  Assessment/Plan:  Acute Hypoxic Resp. Failure/Pneumonia due to COVID-19   Recent Labs  Lab 06/06/20 0432 06/09/20 1045 06/10/20 0124 06/11/20 0451 06/12/20 0111  DDIMER >20.00*  --   --   --   --   FERRITIN 549*  --   --   --   --   CRP 3.5*  --   --   --   --   ALT 43 327* 351* 361* 317*    COVID 19 Therapeutics: Antibacterials: Antibiotics were given in the ED but not continued.  Procalcitonin was 0.19 Remdesivir: It appears that he did not receive Remdesivir Steroids: Remains on Solu-Medrol.  We will start tapering. Diuretics: Not on scheduled diuretics Inhaled Steroids: None noted Actemra/Baricitinib: Patient was given Actemra PUD Prophylaxis: On Protonix DVT Prophylaxis: On therapeutic Lovenox  Tested positive on 1/16  From a respiratory standpoint patient appears to be improving.  His oxygen requirements have gone down significantly.  Patient was treated with steroids and Actemra.  Did not get Remdesivir. Underwent CT angiogram on 1/17 which was negative for PE.  No need for antibacterials currently.  Start tapering steroids.  Acute embolic right MCA stroke/acute  metabolic encephalopathy -While he was at [facility] patient was found to have an acute stroke.  Patient was transferred to [facility] showed right MCA stroke with tiny hemorrhagic transformation. -Carotid Doppler significant for minor carotid arthrosclerosis -The echo with EF 55 to 60% -EEG with no seizures -Follow Patty has significantly improved, most likely in the setting of his acute CVA, he is more interactive. -Work-up significant for A. fib, currently on heparin gtt. for anticoagulation,  till PEG is done, then can be switched to Eliquis. -Recommendation for SNF placement -[place] with significant dysphagia, plan for PEG  Atrial fibrillation with RVR - Initially patient was requiring diltiazem infusion.   -On heparin GTT for anticoagulation, can be transitioned to Eliquis after PICC is placed.   Oropharyngeal dysphagia - on core track tube feed, will need PEG  Abnormal LFTs Patient was found to have transaminitis as well as hyperbilirubinemia.  Fractionated bilirubin showed equal direct and indirect bilirubin.  No anemia noted.  Renal function was normal.  Right upper quadrant ultrasound does show gallstones and gallbladder sludge.  No evidence for acute cholecystitis.  Hepatic steatosis noted.  No biliary ductal dilatation.  Alkaline phosphatase is normal.  Patient is nontender on abdominal examination.  Will check hepatitis panel.  Bilirubin could be elevated due to acute illness/Covid.  Continue to trend. -Hold statins  Hyponatremia Appears to have resolved.  Possibly due to volume depletion.    Mildly elevated troponin Thought to be secondary to demand ischemia from tachyarrhythmia.  Echocardiogram showed EF to be 50 to 55% without any wall motion abnormality.  Do not anticipate any further inpatient cardiac work-up at this time.  Hyperglycemia HbA1c 5.2.  Likely due to steroids.  Seems to be stable.  Continue sliding scale coverage.  Presumptive  COPD Quit tobacco about 20 years ago.  Has a 50-pack-year history of smoking.  Continue inhalers.  Oral thrush -will be started on Magic mouthwash through oral care and Diflucan via tube.  Goals of care Palliative care is following.  Patient's family currently desires full scope of care.  Prognosis however remains guarded to poor, but as discussed with daughter-in-law and wife, plan to continue with full scope of treatment including PEG tube insertion, and placement.  DVT Prophylaxis: On therapeutic Lovenox Code Status: Full code Family Communication: Daughter-in-law [name] updated by phone today [phone]  Disposition Plan: Unclear for now.  Will likely need to go to skilled nursing facility  Status is: Inpatient  Remains inpatient appropriate because:Altered mental status, IV treatments appropriate due to intensity of illness or inability to take PO and Inpatient level of care appropriate due to severity of illness   Dispo: The patient is from: Home              Anticipated d/c is to: To be determined  Anticipated d/c date is: 3 days              Patient currently is not medically stable to d/c.   Difficult to place patient No     Medications:  Scheduled: . Chlorhexidine Gluconate Cloth  6 each Topical Daily  . feeding supplement (PROSource TF)  45 mL Per Tube BID  . fluconazole  100 mg Per Tube Daily  . folic acid  1 mg Intravenous Daily  . free water  100 mL Per Tube Q6H  . insulin aspart  0-9 Units Subcutaneous Q4H  . magic mouthwash  10 mL Oral QID  . mouth rinse  15 mL Mouth Rinse BID  . metoprolol tartrate  2.5 mg Intravenous Q8H  . pantoprazole (PROTONIX) IV  40 mg Intravenous QHS  . sodium chloride flush  3 mL Intravenous Q12H  . sodium chloride flush  3 mL Intravenous Q12H  . thiamine injection  100 mg Intravenous Daily   Continuous: . sodium chloride    . feeding supplement (OSMOLITE 1.5 CAL) 1,000 mL (06/12/20 0124)  . heparin 700 Units/hr  (06/12/20 0945)  . sodium chloride     PZW:CHENID chloride, acetaminophen **OR** acetaminophen, bisacodyl, chlorpheniramine-HYDROcodone, guaiFENesin-dextromethorphan, ondansetron **OR** ondansetron (ZOFRAN) IV, polyethylene glycol, polyvinyl alcohol, sodium chloride flush   Objective:  Vital Signs  Vitals:   06/11/20 2330 06/12/20 0235 06/12/20 0335 06/12/20 1232  BP: 129/68  135/66 134/73  Pulse: 77  98 (!) 184  Resp: 19  18 20   Temp: 97.9 F (36.6 C)  97.7 F (36.5 C) 98 F (36.7 C)  TempSrc: Axillary  Axillary Axillary  SpO2: 95%  92% 94%  Weight:  69.9 kg    Height:        Intake/Output Summary (Last 24 hours) at 06/12/2020 1507 Last data filed at 06/12/2020 0500 Gross per 24 hour  Intake 1407.52 ml  Output 0 ml  Net 1407.52 ml   Filed Weights   06/11/20 0500 06/11/20 2000 06/12/20 0235  Weight: 69.9 kg 69.9 kg 69.9 kg   Physical exam:  Patient is awake, follows simple commands, trying to verbalize, he is with dense left-sided hemiparesis, Symmetrical Chest wall movement, no wheezing, diminished air entry at the bases Irregular irregular,No Gallops,Rubs or new Murmurs, No Parasternal Heave +ve B.Sounds, Abd Soft, No tenderness, No rebound - guarding or rigidity. No Cyanosis, Clubbing or edema, No new Rash or bruise     Lab Results:  Data Reviewed: I have personally reviewed following labs and imaging studies  CBC: Recent Labs  Lab 06/06/20 0432 06/07/20 0442 06/09/20 1045 06/10/20 0124 06/11/20 0451 06/12/20 0111  WBC 12.2* 10.9* 10.6* 14.0* 12.1* 16.3*  NEUTROABS 11.1*  --   --   --   --   --   HGB 13.3 14.4 16.0 16.2 14.7 15.6  HCT 39.8 43.1 45.6 45.7 42.3 45.0  MCV 99.0 99.5 95.6 95.6 96.4 97.8  PLT 204 231 192 196 165 782    Basic Metabolic Panel: Recent Labs  Lab 06/06/20 0432 06/06/20 2358 06/09/20 1045 06/10/20 0124 06/11/20 0451 06/12/20 0111  NA 148* 147* 145 142 145 146*  K 3.6 3.8 4.2 4.2 4.4 4.8  CL 111 112* 111 111 114* 113*   CO2 25 26 23 22 24 24   GLUCOSE 131* 133* 129* 153* 193* 140*  BUN 44* 45* 40* 41* 42* 49*  CREATININE 0.75 0.76 0.64 0.73 0.76 0.82  CALCIUM 8.1* 8.1* 7.9* 7.8* 7.7* 7.9*  MG 2.6*  --   --   --   --   --  PHOS 3.8  --   --   --   --   --     GFR: Estimated Creatinine Clearance: 66 mL/min (by C-G formula based on SCr of 0.82 mg/dL).  Liver Function Tests: Recent Labs  Lab 06/06/20 0432 06/09/20 1045 06/09/20 1314 06/10/20 0124 06/11/20 0451 06/12/20 0111  AST 31 121*  --  121* 87* 64*  ALT 43 327*  --  351* 361* 317*  ALKPHOS 94 75  --  73 76 76  BILITOT 1.4* 6.9* 7.0* 6.6* 2.6* 2.5*  PROT 6.1* 5.5*  --  5.4* 4.9* 5.3*  ALBUMIN 2.9* 2.8*  --  2.7* 2.4* 2.7*     Coagulation Profile: Recent Labs  Lab 06/06/20 2358  INR 1.5*     CBG: Recent Labs  Lab 06/11/20 1642 06/11/20 2022 06/11/20 2335 06/12/20 0329 06/12/20 1235  GLUCAP 128* 102* 122* 134* 99     No results found for this or any previous visit (from the past 240 hour(s)).    Radiology Studies: CT ABDOMEN WO CONTRAST  Result Date: 06/11/2020 CLINICAL DATA:  Evaluate anatomy prior to gastrostomy tube placement. History of COVID-19 infection. EXAM: CT ABDOMEN WITHOUT CONTRAST TECHNIQUE: Multidetector CT imaging of the abdomen was performed following the standard protocol without IV contrast. COMPARISON:  Chest CT-06/01/2020 FINDINGS: Lower chest: Limited visualization of the lower thorax demonstrates bibasilar ground-glass interstitial opacities with relative subpleural sparing. Subpleural nodular opacities are seen within the imaged lung bases. Overall, there is improved aeration of lung bases compared to the 06/01/2020 examination. No air bronchograms. No pleural effusion. Normal heart size. Coronary artery calcifications. No pericardial effusion. Hepatobiliary: Normal hepatic contour. High-density material is seen within the gallbladder fossa potentially radiopaque sludge and/or stones. No noncontrast  evidence of gallbladder wall thickening or pericholecystic stranding. No ascites. Pancreas: Normal noncontrast appearance of the pancreas. Spleen: Normal noncontrast appearance of the spleen. Adrenals/Urinary Tract: No renal stones. There is a minimal amount of grossly symmetric likely age and body habitus related perinephric stranding. No urine obstruction. Normal noncontrast appearance the bilateral adrenal glands. The urinary bladder was not imaged. Stomach/Bowel: The anterior wall of the mid body of the stomach is well apposed against the ventral wall of the upper abdomen without interposition of either the hepatic parenchyma or transverse colon and the percutaneous window will likely be improved with gastric insufflation. Enteric tube tip terminates within the duodenal bulb. Scattered colonic diverticulosis. The cecum is noted to be located within the right upper abdominal quadrant. Normal noncontrast appearance of the terminal ileum and appendix. No pneumoperitoneum, pneumatosis or portal venous gas. Vascular/Lymphatic: Aneurysmal dilatation of the infrarenal abdominal aorta measuring approximately 3.0 x 3.0 x 3.0 cm as measured in greatest oblique short axis axial (image 41, series 3), coronal (image 34, series 6) and sagittal (image 60, series 7) dimensions respectively. There is a moderate to large amount of eccentric mixed calcified and noncalcified atherosclerotic plaque throughout the abdominal aorta. Mild ectasia of the right common iliac artery measuring 1.6 cm in diameter. No bulky retroperitoneal or mesenteric lymphadenopathy on this noncontrast examination. Other: Mild diffuse body wall anasarca. Musculoskeletal: No acute or aggressive osseous abnormalities. Moderate severe multilevel lumbar spine DDD, worse at L1-L2, and a lesser extent, L3-L4 and L5-S1 with disc space height loss, endplate irregularity and sclerosis. Grade 1 anterolisthesis of L3 upon L4 measuring approximately 4 mm. No associated  pars defects. IMPRESSION: 1. Gastric anatomy amenable to potential percutaneous gastrostomy tube placement as indicated. 2. Cholelithiasis without evidence of cholecystitis.  3. Colonic diverticulosis without evidence of diverticulitis. 4. Improved aeration of the lung bases compared to the 06/01/2020 examination with persistent bibasilar ground-glass interstitial opacities and subpleural nodular opacities, nonspecific though compatible with provided history of COVID-19 infection. 5. Aneurysmal dilatation of the infrarenal abdominal aorta measuring 3.0 cm in diameter. Recommend follow-up aortic ultrasound in 3 years. This recommendation follows ACR consensus guidelines: White Paper of the ACR Incidental Findings Committee II on Vascular Findings. J Am Coll Radiol 2013; 10:789-794. 6. Coronary calcifications.  Aortic Atherosclerosis (ICD10-I70.0). Electronically Signed   By: Sandi Mariscal M.D.   On: 06/11/2020 16:37       LOS: 57 days   Phillips Climes MD  [facility] Pager on [email or web address]  06/12/2020, 3:07 PM

## 2020-06-12 NOTE — Progress Notes (Signed)
CSW following for discharge needs once medically stable.   Rossanna Spitzley LCSW

## 2020-06-12 NOTE — Progress Notes (Signed)
  Speech Language Pathology Treatment: Dysphagia;Cognitive-Linquistic  Patient Details Name: Joshua Robinson MRN: [id number] DOB: 30-Jul-1934 Today's Date: 06/12/2020 Time: 3382-5053 SLP Time Calculation (min) (ACUTE ONLY): 23 min  Assessment / Plan / Recommendation Clinical Impression  Pt demonstrates significant improvements today with dysphagia. Pts oral mucosa was in much better condition and pt was able to achieve a tight labial seal and lingual propulsion of bolus consistently with teaspoons of honey thick water liquids. There was intermittent coughing with this texture and pharyngeal residue was suspected. Pt was then able to self feed an entire cup of puree with very good oral manipulation and no significant residue. Will proceed with FEES tomorrow for an objective view of swallowing function.   HPI HPI: 86 year old male with no documented chronic medical problems, but is a reformed smoker of 50 pack years presenting with myalgias, coughing, and shortness of breath that began on 05/24/2020.  The patient states that he visited his PCP on a virtual visit on 05/27/2019.  He was given oral steroids and azithromycin.  His symptoms did not improve.  He continued to have worsening shortness of breath, coughing.  He did have some intermittent chest discomfort with coughing but denied any hemoptysis.  He had some loose stools but did not have any hematochezia or melena.  He also had subjective fevers and chills at home.  He has had 2 doses of the Moderna vaccination but has not been boosted.  EMS was activated and the patient was noted to have oxygen saturation 67% on room air.  He was placed on CPAP and brought to the hospital.  In the emergency department, the patient was subsequently placed on high flow nasal cannula.  He did not tolerate it well and was placed on BiPAP.  He has been subsequently transitioned to heated high flow at 40 L. CT showed Evolving cytotoxic edema involving the right insula and  overlying right frontal lobe, consistent with acute to early subacute right  MCA distribution infarct.. Pt is tolerating 10 L HFNC today. BSE requested      SLP Plan  Continue with current plan of care       Recommendations  Diet recommendations: NPO (ice chips)                Oral Care Recommendations: Oral care QID;Oral care prior to ice chip/H20 Follow up Recommendations: 24 hour supervision/assistance;Home health SLP Plan: Continue with current plan of care       GO               Joshua Baltimore, MA [place] Pager [phone] Office [phone]  Lynann Beaver 06/12/2020, 1:23 PM

## 2020-06-12 NOTE — Progress Notes (Signed)
ANTICOAGULATION CONSULT NOTE - Follow Up Consult  Pharmacy Consult for IV Heparin Indication: Atrial fibrillation  Labs: Recent Labs    06/10/20 0124 06/10/20 1937 06/11/20 0451 06/11/20 1317 06/12/20 0111 06/12/20 1645  HGB 16.2  --  14.7  --  15.6  --   HCT 45.7  --  42.3  --  45.0  --   PLT 196  --  165  --  152  --   HEPARINUNFRC  --    < > 0.56 0.50 0.65 0.41  CREATININE 0.73  --  0.76  --  0.82  --    < > = values in this interval not displayed.     Assessment: 86 yr old male with no significant medical history known presented with COVID, then developed a new embolic stroke and atrial fibrillation. Pharmacy was consulted for heparin dosing/monitoring; discussed with primary team and Dr. Erlinda Hong; will start stroke protocol due to stroke diagnosis (low goal and no heparin boluses).   Patient was on therapeutic enoxaparin; last dose in AM 1/24. Heparin level came back in range tonight at 0.41. We will f/u in AM  Goal of Therapy:  Heparin level: 0.3-0.5 units/ml Monitor platelets by anticoagulation protocol: Yes   Plan:  Cont heparin infusion 700 units/hr Monitor daily heparin level, CBC Monitor for bleeding  Onnie Boer, PharmD, BCIDP, AAHIVP, CPP Infectious Disease Pharmacist 06/12/2020 6:31 PM

## 2020-06-12 NOTE — Progress Notes (Signed)
Daily Progress Note   Patient Name: Joshua Robinson       Date: 06/12/2020 DOB: [birth year]  Age: 86 y.o. MRN#: [id number] Attending Physician: Joshua Patricia, MD Primary Care Physician: Joshua School, MD Admit Date: 06/01/2020  Reason for Consultation/Follow-up: Establishing goals of care  Subjective: Chart reviewed. Report received from Joshua Robinson. Called patient's spouse and daughter in law.  Gave spouse emotional support as she is missing her husband.  Daughter in law- Joshua Robinson is a Immunologist. We discussed the current plan for her Father in law- PEG tube, rehab- if improve then home with Palliative- if not then home with Hospice.  Code status was discussed- recommend DNR as full resuscitation to this debilitated patient is likely to cause him more harm than to help restore him to functional state. Joshua Robinson agrees and will discuss with family.    Length of Stay: 11  Current Medications: Scheduled Meds:  . Chlorhexidine Gluconate Cloth  6 each Topical Daily  . feeding supplement (PROSource TF)  45 mL Per Tube BID  . fluconazole  100 mg Per Tube Daily  . folic acid  1 mg Intravenous Daily  . free water  100 mL Per Tube Q6H  . insulin aspart  0-9 Units Subcutaneous Q4H  . magic mouthwash  10 mL Oral QID  . mouth rinse  15 mL Mouth Rinse BID  . metoprolol tartrate  2.5 mg Intravenous Q8H  . pantoprazole (PROTONIX) IV  40 mg Intravenous QHS  . sodium chloride flush  3 mL Intravenous Q12H  . sodium chloride flush  3 mL Intravenous Q12H  . thiamine injection  100 mg Intravenous Daily    Continuous Infusions: . sodium chloride    . feeding supplement (OSMOLITE 1.5 CAL) 1,000 mL (06/12/20 0124)  . heparin 700 Units/hr (06/12/20 0945)  . sodium chloride      PRN Meds: sodium  chloride, acetaminophen **OR** acetaminophen, bisacodyl, chlorpheniramine-HYDROcodone, guaiFENesin-dextromethorphan, ondansetron **OR** ondansetron (ZOFRAN) IV, polyethylene glycol, polyvinyl alcohol, sodium chloride flush  Physical Exam          Vital Signs: BP (!) 143/75 (BP Location: Right Arm)   Pulse 84   Temp 98.5 F (36.9 C) (Axillary)   Resp 20   Ht 5\' 8"  (1.727 m)   Wt 69.9 kg   SpO2 94%  BMI 23.43 kg/m  SpO2: SpO2: 94 % O2 Device: O2 Device: Room Air O2 Flow Rate: O2 Flow Rate (L/min): 1 L/min  Intake/output summary:   Intake/Output Summary (Last 24 hours) at 06/12/2020 1738 Last data filed at 06/12/2020 0500 Gross per 24 hour  Intake 906.6 ml  Output 0 ml  Net 906.6 ml   LBM: Last BM Date: 06/10/20 Baseline Weight: Weight: 74.8 kg Most recent weight: Weight: 69.9 kg       Palliative Assessment/Data: PPS: 10%    Flowsheet Rows   Flowsheet Row Most Recent Value  Intake Tab   Referral Department Hospitalist  Unit at Time of Referral ICU  Palliative Care Primary Diagnosis Pulmonary  Date Notified 06/03/20  Palliative Care Type New Palliative care  Reason for referral Clarify Goals of Care  Date of Admission 06/01/20  Date first seen by Palliative Care 06/03/20  # of days Palliative referral response time 0 Day(s)  # of days IP prior to Palliative referral 2  Clinical Assessment   Psychosocial & Spiritual Assessment   Palliative Care Outcomes       Patient Active Problem List   Diagnosis Date Noted  . Cerebral embolism with cerebral infarction 06/09/2020  . Atrial fibrillation with RVR ([place]) 06/02/2020  . Acute respiratory disease due to COVID-19 virus 06/01/2020  . Acute respiratory failure with hypoxia Due to Covid PNA 06/01/2020  . Pneumonia due to COVID-19 virus 06/01/2020  . Postoperative anemia due to acute blood loss 03/02/2013  . Hyponatremia 03/02/2013  . OA (osteoarthritis) of knee 02/19/2013    Palliative Care Assessment & Plan    Patient Profile: Per H&P---"86 year old male with no documented chronic medical problems, but is a reformed smoker of 50 pack years presenting with myalgias, coughing, and shortness of breath that began on 05/24/2020. The patient states that he visited his PCP on a virtual visit on 05/27/2019. He was given oral steroids and azithromycin. His symptoms did not improve. He continued to have worsening shortness of breath, coughing. He did have some intermittent chest discomfort with coughing but denied any hemoptysis. He had some loose stools but did not have any hematochezia or melena. He also had subjective fevers and chills at home. He has had 2 doses of the Moderna vaccination but has not been boosted. EMS was activated and the patient was noted to have oxygen saturation 67% on room air. He was placed on CPAP and brought to the hospital. In the emergency department, the patient was subsequently placed on high flow nasal cannula. He did not tolerate it well and was placed on BiPAP. He has been subsequently transitioned to heated high flow at 40 L. The patient was given Actemra and started on intravenous steroids. In addition, the patient was noted to have atrial fibrillation with RVR. He was started on a diltiazem drip. New onset of LUE/LLE hemiplegia due to recent right MCA sroke" Palliative medicine consulted for [place]. Family has continued to express desire for full aggressive care.   Assessment/Recommendations/Plan  Plan for PEG placement, d/c to rehab Recommend Palliative to follow at rehab If patient fails to improve at rehab- then family would like to take him home with Hospice Joshua Robinson to discuss DNR with family- he would not want prolonged ventilation   Goals of Care and Additional Recommendations: Limitations on Scope of Treatment: Full Scope Treatment  Code Status: Full code  Prognosis:  Unable to determine  Discharge Planning: [place] for rehab with  Palliative care service follow-up  Care plan was discussed with patient's spouse and daughter in law.   Thank you for allowing the Palliative Medicine Team to assist in the care of this patient.   Total time: 53 minutes  The above conversation was completed via telephone due to the visitor restrictions during the COVID-19 pandemic. Thorough chart review and discussion with necessary members of the care team was completed as part of assessment. All issues were discussed and addressed but no physical exam was performed.  Greater than 50%  of this time was spent counseling and coordinating care related to the above assessment and plan.  Mariana Kaufman, AGNP-C Palliative Medicine   Please contact Palliative Medicine Team phone at [phone] for questions and concerns.

## 2020-06-13 DIAGNOSIS — J9601 Acute respiratory failure with hypoxia: Secondary | ICD-10-CM | POA: Diagnosis not present

## 2020-06-13 DIAGNOSIS — Z7189 Other specified counseling: Secondary | ICD-10-CM | POA: Diagnosis not present

## 2020-06-13 LAB — HEPARIN LEVEL (UNFRACTIONATED): Heparin Unfractionated: 0.43 IU/mL (ref 0.30–0.70)

## 2020-06-13 LAB — COMPREHENSIVE METABOLIC PANEL
ALT: 234 U/L — ABNORMAL HIGH (ref 0–44)
AST: 46 U/L — ABNORMAL HIGH (ref 15–41)
Albumin: 2.5 g/dL — ABNORMAL LOW (ref 3.5–5.0)
Alkaline Phosphatase: 67 U/L (ref 38–126)
Anion gap: 10 (ref 5–15)
BUN: 43 mg/dL — ABNORMAL HIGH (ref 8–23)
CO2: 23 mmol/L (ref 22–32)
Calcium: 7.7 mg/dL — ABNORMAL LOW (ref 8.9–10.3)
Chloride: 108 mmol/L (ref 98–111)
Creatinine, Ser: 0.7 mg/dL (ref 0.61–1.24)
GFR, Estimated: >60 mL/min (ref >60)
Glucose, Bld: 90 mg/dL (ref 70–99)
Potassium: 4.6 mmol/L (ref 3.5–5.1)
Sodium: 141 mmol/L (ref 135–145)
Total Bilirubin: 2.2 mg/dL — ABNORMAL HIGH (ref 0.3–1.2)
Total Protein: 5 g/dL — ABNORMAL LOW (ref 6.5–8.1)

## 2020-06-13 LAB — CBC
HCT: 44.2 % (ref 39.0–52.0)
Hemoglobin: 14.9 g/dL (ref 13.0–17.0)
MCH: 32.4 pg (ref 26.0–34.0)
MCHC: 33.7 g/dL (ref 30.0–36.0)
MCV: 96.1 fL (ref 80.0–100.0)
Platelets: 110 10*3/uL — ABNORMAL LOW (ref 150–400)
RBC: 4.6 MIL/uL (ref 4.22–5.81)
RDW: 13.5 % (ref 11.5–15.5)
WBC: 12.7 10*3/uL — ABNORMAL HIGH (ref 4.0–10.5)
nRBC: 0 % (ref 0.0–0.2)

## 2020-06-13 LAB — GLUCOSE, CAPILLARY
Glucose-Capillary: 105 mg/dL — ABNORMAL HIGH (ref 70–99)
Glucose-Capillary: 110 mg/dL — ABNORMAL HIGH (ref 70–99)
Glucose-Capillary: 117 mg/dL — ABNORMAL HIGH (ref 70–99)
Glucose-Capillary: 124 mg/dL — ABNORMAL HIGH (ref 70–99)
Glucose-Capillary: 83 mg/dL (ref 70–99)
Glucose-Capillary: 87 mg/dL (ref 70–99)
Glucose-Capillary: 99 mg/dL (ref 70–99)

## 2020-06-13 MED ORDER — FONDAPARINUX SODIUM 7.5 MG/0.6ML ~~LOC~~ SOLN
7.5000 mg | SUBCUTANEOUS | Status: DC
  Administered 2020-06-13 – 2020-06-14 (×2): 7.5 mg via SUBCUTANEOUS
  Filled 2020-06-13 (×3): qty 0.6

## 2020-06-13 MED ORDER — RESOURCE THICKENUP CLEAR PO POWD
ORAL | Status: DC | PRN
  Filled 2020-06-13: qty 125

## 2020-06-13 NOTE — Progress Notes (Signed)
IR requested by Dr. Waldron Labs for possible image-guided percutaneous gastrostomy tube placement.  Case/images have been reviewed by Dr. Pascal Lux who approves procedure. Patient is COVID-19 (+) as of 06/01/2020. Per IR protocol, procedure cannot occur until patient is off precautions as this is an aerosolizing procedure. Dr. Waldron Labs made aware. IR to follow-up regarding timing/consent/full pre-procedural work-up once patient is off precautions.  Please call IR with questions/concerns.   Bea Graff Levert Heslop, PA-C 06/13/2020, 10:48 AM

## 2020-06-13 NOTE — Procedures (Signed)
Objective Swallowing Evaluation: Type of Study: FEES-Fiberoptic Endoscopic Evaluation of Swallow   Patient Details  Name: Joshua Robinson MRN: [id number] Date of Birth: 1935/03/22  Today's Date: 06/13/2020 Time: SLP Start Time (ACUTE ONLY): 54 -SLP Stop Time (ACUTE ONLY): 1248  SLP Time Calculation (min) (ACUTE ONLY): 28 min   Past Medical History:  Past Medical History:  Diagnosis Date  . Arthritis   . Cancer [facility])    skin   Past Surgical History:  Past Surgical History:  Procedure Laterality Date  . CATARACT EXTRACTION Right   . FOOT SURGERY Bilateral 86 years old   "painful flat foot surgery"  . SKIN CANCER EXCISION Right    cheek  . TOTAL KNEE ARTHROPLASTY Right 02/19/2013   Procedure: RIGHT TOTAL KNEE ARTHROPLASTY;  Surgeon: Gearlean Alf, MD;  Location: [facility] ORS;  Service: Orthopedics;  Laterality: Right;   HPI: 86 year old male with no documented chronic medical problems, but is a reformed smoker of 50 pack years presenting with myalgias, coughing, and shortness of breath that began on 05/24/2020.  The patient states that he visited his PCP on a virtual visit on 05/27/2019.  He was given oral steroids and azithromycin.  His symptoms did not improve.  He continued to have worsening shortness of breath, coughing.  He did have some intermittent chest discomfort with coughing but denied any hemoptysis.  He had some loose stools but did not have any hematochezia or melena.  He also had subjective fevers and chills at home.  He has had 2 doses of the Moderna vaccination but has not been boosted.  EMS was activated and the patient was noted to have oxygen saturation 67% on room air.  He was placed on CPAP and brought to the hospital.  In the emergency department, the patient was subsequently placed on high flow nasal cannula.  He did not tolerate it well and was placed on BiPAP.  He has been subsequently transitioned to heated high flow at 40 L. CT showed Evolving cytotoxic edema  involving the right insula and overlying right frontal lobe, consistent with acute to early subacute right  MCA distribution infarct.. Pt is tolerating 10 L HFNC today. BSE requested   No data recorded   Assessment / Plan / Recommendation  CHL IP CLINICAL IMPRESSIONS 06/13/2020  Clinical Impression Pt demonstrates a moderate pharyngeal dysphagia with decrased base of tongue propulsion and weakned pharyngeal stripping resulting in moderate residue in the valleculae and lateral channels (right because pt right leaning). Residue more severe with increased viscosity, but additionally swallow response delayed and thinkend liquids pooled safely in the lateral channels and pyriforms prior to the swallow with only trace sensed pentration before the swallow with teaspoons of honey. Right leaning seems to benefit pt to an extent to allow liquids to pool on the right stronger side as pt initaite swallow. Chin tuck and effortful swallow cues did not decreased significant vallecular residue with puree. Recommend pt initiate teaspoons of honey thick liquids to given opportunities to swallow and keep oral mucosa hydrated. Pt may be able to upgrade to puree as strength improves. Marland Kitchen  SLP Visit Diagnosis Dysphagia, oropharyngeal phase (R13.12)  Attention and concentration deficit following --  Frontal lobe and executive function deficit following --  Impact on safety and function Moderate aspiration risk;Risk for inadequate nutrition/hydration      CHL IP TREATMENT RECOMMENDATION 06/13/2020  Treatment Recommendations Therapy as outlined in treatment plan below     Prognosis 06/13/2020  Prognosis for Safe Diet  Advancement Fair  Barriers to Reach Goals Severity of deficits  Barriers/Prognosis Comment --    CHL IP DIET RECOMMENDATION 06/13/2020  SLP Diet Recommendations Honey thick liquids  Liquid Administration via Spoon  Medication Administration --  Compensations --  Postural Changes --      No flowsheet  data found.    CHL IP FOLLOW UP RECOMMENDATIONS 06/13/2020  Follow up Recommendations Inpatient Rehab      CHL IP FREQUENCY AND DURATION 06/13/2020  Speech Therapy Frequency (ACUTE ONLY) min 2x/week  Treatment Duration 2 weeks           CHL IP ORAL PHASE 06/13/2020  Oral Phase Impaired  Oral - Pudding Teaspoon --  Oral - Pudding Cup --  Oral - Honey Teaspoon Lingual/palatal residue  Oral - Honey Cup --  Oral - Nectar Teaspoon Lingual/palatal residue  Oral - Nectar Cup Lingual/palatal residue  Oral - Nectar Straw --  Oral - Thin Teaspoon --  Oral - Thin Cup --  Oral - Thin Straw --  Oral - Puree Lingual/palatal residue  Oral - Mech Soft --  Oral - Regular --  Oral - Multi-Consistency --  Oral - Pill --  Oral Phase - Comment --    CHL IP PHARYNGEAL PHASE 06/13/2020  Pharyngeal Phase Impaired  Pharyngeal- Pudding Teaspoon --  Pharyngeal --  Pharyngeal- Pudding Cup --  Pharyngeal --  Pharyngeal- Honey Teaspoon Penetration/Aspiration before swallow;Delayed swallow initiation-pyriform sinuses;Reduced tongue base retraction;Reduced pharyngeal peristalsis;Lateral channel residue;Pharyngeal residue - valleculae  Pharyngeal Material enters airway, CONTACTS cords and then ejected out  Pharyngeal- Honey Cup --  Pharyngeal --  Pharyngeal- Nectar Teaspoon Penetration/Aspiration before swallow;Delayed swallow initiation-pyriform sinuses;Reduced tongue base retraction;Reduced pharyngeal peristalsis;Lateral channel residue;Pharyngeal residue - valleculae  Pharyngeal Material enters airway, CONTACTS cords and then ejected out;Material does not enter airway  Pharyngeal- Nectar Cup Penetration/Aspiration before swallow;Delayed swallow initiation-pyriform sinuses;Reduced tongue base retraction;Reduced pharyngeal peristalsis;Lateral channel residue;Pharyngeal residue - valleculae  Pharyngeal Material enters airway, passes BELOW cords then ejected out  Pharyngeal- Nectar Straw --  Pharyngeal --   Pharyngeal- Thin Teaspoon --  Pharyngeal --  Pharyngeal- Thin Cup --  Pharyngeal --  Pharyngeal- Thin Straw --  Pharyngeal --  Pharyngeal- Puree Reduced epiglottic inversion;Reduced pharyngeal peristalsis;Pharyngeal residue - valleculae;Lateral channel residue  Pharyngeal --  Pharyngeal- Mechanical Soft --  Pharyngeal --  Pharyngeal- Regular --  Pharyngeal --  Pharyngeal- Multi-consistency --  Pharyngeal --  Pharyngeal- Pill --  Pharyngeal --  Pharyngeal Comment --     No flowsheet data found.   Stanisha Lorenz, Katherene Ponto 06/13/2020, 2:13 PM

## 2020-06-13 NOTE — Progress Notes (Signed)
Daily Progress Note   Patient Name: Joshua Robinson       Date: 06/13/2020 DOB: 14-Oct-1934  Age: 86 y.o. MRN#: [id number] Attending Physician: Albertine Patricia, MD Primary Care Physician: Redmond School, MD Admit Date: 06/01/2020  Reason for Consultation/Follow-up: Establishing goals of care  Subjective: Per nurse report and PT note- patient following commands, communicating some with clear phrases.  Spoke with patient's daughter in law [name]- Family would like to request DNR status for patient. Plan to continue full scope care otherewise- however, if he looked to be declining- they would like to be contacted and to consider transition to comfort measures only. She was very clear that if patient appeared to be approaching end of life then comfort would be a priority.   ROS  Length of Stay: 12  Current Medications: Scheduled Meds:  . Chlorhexidine Gluconate Cloth  6 each Topical Daily  . feeding supplement (PROSource TF)  45 mL Per Tube BID  . folic acid  1 mg Intravenous Daily  . fondaparinux (ARIXTRA) injection  7.5 mg Subcutaneous Q24H  . free water  100 mL Per Tube Q6H  . insulin aspart  0-9 Units Subcutaneous Q4H  . magic mouthwash  10 mL Oral QID  . mouth rinse  15 mL Mouth Rinse BID  . metoprolol tartrate  2.5 mg Intravenous Q8H  . pantoprazole (PROTONIX) IV  40 mg Intravenous QHS  . sodium chloride flush  3 mL Intravenous Q12H  . sodium chloride flush  3 mL Intravenous Q12H  . thiamine injection  100 mg Intravenous Daily    Continuous Infusions: . sodium chloride    . feeding supplement (OSMOLITE 1.5 CAL) 1,000 mL (06/12/20 0124)  . sodium chloride      PRN Meds: sodium chloride, acetaminophen **OR** acetaminophen, bisacodyl, chlorpheniramine-HYDROcodone,  guaiFENesin-dextromethorphan, ondansetron **OR** ondansetron (ZOFRAN) IV, polyethylene glycol, polyvinyl alcohol, Resource ThickenUp Clear, sodium chloride flush  Physical Exam          Vital Signs: BP (!) 109/92 (BP Location: Right Arm)   Pulse 79   Temp 97.8 F (36.6 C) (Oral)   Resp 20   Ht 5\' 8"  (1.727 m)   Wt 69.8 kg   SpO2 100%   BMI 23.40 kg/m  SpO2: SpO2: 100 % O2 Device: O2 Device: Room Air O2 Flow Rate: O2  Flow Rate (L/min): 1 L/min  Intake/output summary:   Intake/Output Summary (Last 24 hours) at 06/13/2020 1455 Last data filed at 06/13/2020 0700 Gross per 24 hour  Intake 769.31 ml  Output -  Net 769.31 ml   LBM: Last BM Date: 06/10/20 Baseline Weight: Weight: 74.8 kg Most recent weight: Weight: 69.8 kg       Palliative Assessment/Data: PPS: 10%    Flowsheet Rows   Flowsheet Row Most Recent Value  Intake Tab   Referral Department Hospitalist  Unit at Time of Referral ICU  Palliative Care Primary Diagnosis Pulmonary  Date Notified 06/03/20  Palliative Care Type New Palliative care  Reason for referral Clarify Goals of Care  Date of Admission 06/01/20  Date first seen by Palliative Care 06/03/20  # of days Palliative referral response time 0 Day(s)  # of days IP prior to Palliative referral 2  Clinical Assessment   Psychosocial & Spiritual Assessment   Palliative Care Outcomes       Patient Active Problem List   Diagnosis Date Noted  . Cerebral embolism with cerebral infarction 06/09/2020  . Atrial fibrillation with RVR ([place]) 06/02/2020  . Acute respiratory disease due to COVID-19 virus 06/01/2020  . Acute respiratory failure with hypoxia Due to Covid PNA 06/01/2020  . Pneumonia due to COVID-19 virus 06/01/2020  . Postoperative anemia due to acute blood loss 03/02/2013  . Hyponatremia 03/02/2013  . OA (osteoarthritis) of knee 02/19/2013    Palliative Care Assessment & Plan   Patient Profile: Per H&P---"86 year old male with no documented  chronic medical problems, but is a reformed smoker of 50 pack years presenting with myalgias, coughing, and shortness of breath that began on 05/24/2020. The patient states that he visited his PCP on a virtual visit on 05/27/2019. He was given oral steroids and azithromycin. His symptoms did not improve. He continued to have worsening shortness of breath, coughing. He did have some intermittent chest discomfort with coughing but denied any hemoptysis. He had some loose stools but did not have any hematochezia or melena. He also had subjective fevers and chills at home. He has had 2 doses of the Moderna vaccination but has not been boosted. EMS was activated and the patient was noted to have oxygen saturation 67% on room air. He was placed on CPAP and brought to the hospital. In the emergency department, the patient was subsequently placed on high flow nasal cannula. He did not tolerate it well and was placed on BiPAP. He has been subsequently transitioned to heated high flow at 40 L. The patient was given Actemra and started on intravenous steroids. In addition, the patient was noted to have atrial fibrillation with RVR. He was started on a diltiazem drip. New onset of LUE/LLE hemiplegia due to recent right MCA sroke" Palliative medicine consulted for [place]. Family has continued to express desire for full aggressive care.    Assessment/Recommendations/Plan   DNR- [name] requests that patient not be told about DNR status by hospital staff- she would like to discuss it with him herself  Continued full scope care with plan for rehab- recommend Palliative follow outpatient- family wishes to transition to full comfort and bring him home with Hospice if he declines towards end of life  Goals of Care and Additional Recommendations:  Limitations on Scope of Treatment: Full Scope Treatment  Code Status:  DNR  Prognosis:   Unable to determine  Discharge Planning:  To Be Determined  Care  plan was discussed with care team and  patient's daughter in law.   Thank you for allowing the Palliative Medicine Team to assist in the care of this patient.   Total time: 37 mins  The above conversation was completed via telephone due to the visitor restrictions during the COVID-19 pandemic. Thorough chart review and discussion with necessary members of the care team was completed as part of assessment. All issues were discussed and addressed but no physical exam was performed.  Greater than 50%  of this time was spent counseling and coordinating care related to the above assessment and plan.  Mariana Kaufman, AGNP-C Palliative Medicine   Please contact Palliative Medicine Team phone at [phone] for questions and concerns.

## 2020-06-13 NOTE — Progress Notes (Signed)
   06/13/20 1025  Family/Significant Other Communication  Family/Significant Other Update Called;Updated (wife [name])

## 2020-06-13 NOTE — Progress Notes (Signed)
PROGRESS NOTE  Joshua RYBINSKI [id number] DOB: 1934/11/06 DOA: 06/01/2020  PCP: Redmond School, MD  Brief History/Interval Summary:   - 86 year-old male with no documented chronic medical problems, but is a reformed smoker of 50 pack years presenting with myalgias, coughing, and shortness of breath that began on 05/24/2020.The patient states that he visited his PCP on a virtual visit on 05/27/2019. He was given oral steroids and azithromycin. His symptoms did not improve. He continued to have worsening shortness of breath, coughing. He has had 2 doses of the Moderna vaccination but has not been boosted.  Found to be positive for COVID-19. In the emergency department, the patient was subsequently placed on high flow nasal cannula. He did not tolerate it well and was placed on BiPAP. He has been subsequently transitioned to heated high flow.  Hospitalized to the intensive care unit and independent.  Respiratory status started improving however patient then developed embolic stroke.  Subsequently transferred to [facility].  Reason for Visit: Pneumonia due to COVID-19.  Acute respiratory failure with hypoxia.  Acute embolic stroke.  Consultants: Pulmonology.  Neurology  Procedures: None yet  Antibiotics: Anti-infectives (From admission, onward)   Start     Dose/Rate Route Frequency Ordered Stop   06/12/20 1600  fluconazole (DIFLUCAN) tablet 100 mg        100 mg Per Tube Daily 06/12/20 1507 06/15/20 0959   06/02/20 1000  cefTRIAXone (ROCEPHIN) 2 g in sodium chloride 0.9 % 100 mL IVPB  Status:  Discontinued        2 g 200 mL/hr over 30 Minutes Intravenous Every 24 hours 06/02/20 0638 06/04/20 1004   06/02/20 1000  azithromycin (ZITHROMAX) 500 mg in sodium chloride 0.9 % 250 mL IVPB  Status:  Discontinued        500 mg 250 mL/hr over 60 Minutes Intravenous Every 24 hours 06/02/20 0638 06/04/20 1004   06/02/20 0600  vancomycin (VANCOREADY) IVPB 750 mg/150 mL  Status:   Discontinued        750 mg 150 mL/hr over 60 Minutes Intravenous Every 12 hours 06/01/20 1333 06/02/20 1136   06/01/20 1245  vancomycin (VANCOREADY) IVPB 1500 mg/300 mL        1,500 mg 150 mL/hr over 120 Minutes Intravenous  Once 06/01/20 1239 06/01/20 1627   06/01/20 1230  cefTRIAXone (ROCEPHIN) 1 g in sodium chloride 0.9 % 100 mL IVPB        1 g 200 mL/hr over 30 Minutes Intravenous  Once 06/01/20 1223 06/01/20 1337   06/01/20 1230  azithromycin (ZITHROMAX) 500 mg in sodium chloride 0.9 % 250 mL IVPB        500 mg 250 mL/hr over 60 Minutes Intravenous  Once 06/01/20 1223 06/01/20 1425      Subjective/Interval History:  There is no significant event as discussed with staff, he himself denies any complaint, no evidence of recurrent urinary retention after discontinuing his Foley catheter .  Assessment/Plan:  Acute Hypoxic Resp. Failure/Pneumonia due to COVID-19   Recent Labs  Lab 06/09/20 1045 06/10/20 0124 06/11/20 0451 06/12/20 0111 06/13/20 0045  ALT 327* 351* 361* 317* 234*    COVID 19 Therapeutics: Antibacterials: Antibiotics were given in the ED but not continued.  Procalcitonin was 0.19 Remdesivir: It appears that he did not receive Remdesivir Steroids: Remains on Solu-Medrol.  We will start tapering. Diuretics: Not on scheduled diuretics Inhaled Steroids: None noted Actemra/Baricitinib: Patient was given Actemra PUD Prophylaxis: On Protonix DVT Prophylaxis: On therapeutic  Lovenox  Tested positive on 1/16  From a respiratory standpoint patient appears to be improving.  His oxygen requirements have gone down significantly.  Patient was treated with steroids and Actemra.  Did not get Remdesivir. Underwent CT angiogram on 1/17 which was negative for PE.  No need for antibacterials currently.  Start tapering steroids.  Acute embolic right MCA stroke/acute metabolic encephalopathy -While he was at [facility] patient was found to have an acute stroke.   Patient was transferred to [facility] showed right MCA stroke with tiny hemorrhagic transformation. -Carotid Doppler significant for minor carotid arthrosclerosis -The echo with EF 55 to 60% -EEG with no seizures -Follow Patty has significantly improved, most likely in the setting of his acute CVA, he is more interactive. -Work-up significant for A. fib, not on full anticoagulation, he is on heparin GTT, I will switch to Arixtra in the setting of thrombocytopenia . -Recommendation for SNF placement -remains with significant dysphagia, plan for PEG, have discussed with interventional radiology, appears he has to wait for the 21 days isolation period  to finish before IR able to insert  PEG tube.  Atrial fibrillation with RVR - Initially patient was requiring diltiazem infusion.   -On heparin GTT for anticoagulation, transition to Arixtra given thrombocytopenia.  Thrombocytopenia -Most likely in the setting of Covid, and acute illness, I will send HIT to evaluate, and switch his heparin to Arixtra pending HIT results. -Will discontinue Diflucan.  Oropharyngeal dysphagia - on core track tube feed, will need PEG  Abnormal LFTs - Patient was found to have transaminitis as well as hyperbilirubinemia.   -  Right upper quadrant ultrasound does show gallstones and gallbladder sludge.  No evidence for acute cholecystitis.  -  Hepatic steatosis noted.  No biliary ductal dilatation.  -Trending down -Negative hepatitis panel -Hold statins  Hyponatremia Appears to have resolved.  Possibly due to volume depletion.    Mildly elevated troponin Thought to be secondary to demand ischemia from tachyarrhythmia.  Echocardiogram showed EF to be 50 to 55% without any wall motion abnormality.  Do not anticipate any further inpatient cardiac work-up at this time.  Hyperglycemia HbA1c 5.2.  Likely due to steroids.  Seems to be stable.  Continue sliding scale coverage.  Presumptive  COPD Quit tobacco about 20 years ago.  Has a 50-pack-year history of smoking.  Continue inhalers.  Oral thrush -This has significantly improved, continue with oral care, and Magic mouthwash , will discontinue Diflucan.  Goals of care Palliative care is following.  Patient's family currently desires full scope of care.  Prognosis however remains guarded to poor, have discussed with daughter-in-law today, she wants to proceed with DNR, continue with current measures of treatment, if patient is deteriorating, or terminally ill, then they would like to proceed with comfort measures, otherwise for now continue with full scope of treatment but he will be DNR.   DVT Prophylaxis: Heparin GTT>>Arixtra Code Status: Patient is DNR , this was confirmed by his daughter-in-law do phone conversation today 1/28. Family Communication: Daughter-in-law [name], and wife updated by phone updated by phone 1/28  Disposition Plan: Unclear for now.  Will likely need to go to skilled nursing facility  Status is: Inpatient  Remains inpatient appropriate because:Altered mental status, IV treatments appropriate due to intensity of illness or inability to take PO and Inpatient level of care appropriate due to severity of illness   Dispo: The patient is from: Home  Anticipated d/c is to: To be determined              Anticipated d/c date is: 3 days              Patient currently is not medically stable to d/c. patient will need PEG tube inserted before discharge to facility   Difficult to place patient No     Medications:  Scheduled: . Chlorhexidine Gluconate Cloth  6 each Topical Daily  . feeding supplement (PROSource TF)  45 mL Per Tube BID  . fluconazole  100 mg Per Tube Daily  . folic acid  1 mg Intravenous Daily  . free water  100 mL Per Tube Q6H  . insulin aspart  0-9 Units Subcutaneous Q4H  . magic mouthwash  10 mL Oral QID  . mouth rinse  15 mL Mouth Rinse BID  . metoprolol tartrate  2.5 mg  Intravenous Q8H  . pantoprazole (PROTONIX) IV  40 mg Intravenous QHS  . sodium chloride flush  3 mL Intravenous Q12H  . sodium chloride flush  3 mL Intravenous Q12H  . thiamine injection  100 mg Intravenous Daily   Continuous: . sodium chloride    . feeding supplement (OSMOLITE 1.5 CAL) 1,000 mL (06/12/20 0124)  . heparin 700 Units/hr (06/12/20 2059)  . sodium chloride     KWI:OXBDZH chloride, acetaminophen **OR** acetaminophen, bisacodyl, chlorpheniramine-HYDROcodone, guaiFENesin-dextromethorphan, ondansetron **OR** ondansetron (ZOFRAN) IV, polyethylene glycol, polyvinyl alcohol, sodium chloride flush   Objective:  Vital Signs  Vitals:   06/13/20 0420 06/13/20 0500 06/13/20 0800 06/13/20 0816  BP: (!) 108/50   125/67  Pulse: 81   78  Resp: 20     Temp: 98.2 F (36.8 C)  97.6 F (36.4 C)   TempSrc: Axillary  Oral   SpO2: 97%   97%  Weight:  69.8 kg    Height:        Intake/Output Summary (Last 24 hours) at 06/13/2020 1315 Last data filed at 06/12/2020 2059 Gross per 24 hour  Intake 115.42 ml  Output -  Net 115.42 ml   Filed Weights   06/11/20 2000 06/12/20 0235 06/13/20 0500  Weight: 69.9 kg 69.9 kg 69.8 kg   Physical exam:  Awake, frail, deconditioned, laying in bed in no apparent distress,follow commands Symmetrical Chest wall movement, Good air movement bilaterally, no wheezing RRR,No Gallops,Rubs or new Murmurs, No Parasternal Heave +ve B.Sounds, Abd Soft, No tenderness, No rebound - guarding or rigidity. No Cyanosis, Clubbing or edema, No new Rash or bruise     Lab Results:  Data Reviewed: I have personally reviewed following labs and imaging studies  CBC: Recent Labs  Lab 06/09/20 1045 06/10/20 0124 06/11/20 0451 06/12/20 0111 06/13/20 0045  WBC 10.6* 14.0* 12.1* 16.3* 12.7*  HGB 16.0 16.2 14.7 15.6 14.9  HCT 45.6 45.7 42.3 45.0 44.2  MCV 95.6 95.6 96.4 97.8 96.1  PLT 192 196 165 152 110*    Basic Metabolic Panel: Recent Labs  Lab  06/09/20 1045 06/10/20 0124 06/11/20 0451 06/12/20 0111 06/13/20 0045  NA 145 142 145 146* 141  K 4.2 4.2 4.4 4.8 4.6  CL 111 111 114* 113* 108  CO2 23 22 24 24 23   GLUCOSE 129* 153* 193* 140* 90  BUN 40* 41* 42* 49* 43*  CREATININE 0.64 0.73 0.76 0.82 0.70  CALCIUM 7.9* 7.8* 7.7* 7.9* 7.7*    GFR: Estimated Creatinine Clearance: 67.7 mL/min (by C-G formula based on SCr of 0.7 mg/dL).  Liver Function  Tests: Recent Labs  Lab 06/09/20 1045 06/09/20 1314 06/10/20 0124 06/11/20 0451 06/12/20 0111 06/13/20 0045  AST 121*  --  121* 87* 64* 46*  ALT 327*  --  351* 361* 317* 234*  ALKPHOS 75  --  73 76 76 67  BILITOT 6.9* 7.0* 6.6* 2.6* 2.5* 2.2*  PROT 5.5*  --  5.4* 4.9* 5.3* 5.0*  ALBUMIN 2.8*  --  2.7* 2.4* 2.7* 2.5*     Coagulation Profile: Recent Labs  Lab 06/06/20 2358  INR 1.5*     CBG: Recent Labs  Lab 06/12/20 1943 06/13/20 0030 06/13/20 0700 06/13/20 0750 06/13/20 1132  GLUCAP 105* 83 105* 124* 99     No results found for this or any previous visit (from the past 240 hour(s)).    Radiology Studies: CT ABDOMEN WO CONTRAST  Result Date: 06/11/2020 CLINICAL DATA:  Evaluate anatomy prior to gastrostomy tube placement. History of COVID-19 infection. EXAM: CT ABDOMEN WITHOUT CONTRAST TECHNIQUE: Multidetector CT imaging of the abdomen was performed following the standard protocol without IV contrast. COMPARISON:  Chest CT-06/01/2020 FINDINGS: Lower chest: Limited visualization of the lower thorax demonstrates bibasilar ground-glass interstitial opacities with relative subpleural sparing. Subpleural nodular opacities are seen within the imaged lung bases. Overall, there is improved aeration of lung bases compared to the 06/01/2020 examination. No air bronchograms. No pleural effusion. Normal heart size. Coronary artery calcifications. No pericardial effusion. Hepatobiliary: Normal hepatic contour. High-density material is seen within the gallbladder fossa  potentially radiopaque sludge and/or stones. No noncontrast evidence of gallbladder wall thickening or pericholecystic stranding. No ascites. Pancreas: Normal noncontrast appearance of the pancreas. Spleen: Normal noncontrast appearance of the spleen. Adrenals/Urinary Tract: No renal stones. There is a minimal amount of grossly symmetric likely age and body habitus related perinephric stranding. No urine obstruction. Normal noncontrast appearance the bilateral adrenal glands. The urinary bladder was not imaged. Stomach/Bowel: The anterior wall of the mid body of the stomach is well apposed against the ventral wall of the upper abdomen without interposition of either the hepatic parenchyma or transverse colon and the percutaneous window will likely be improved with gastric insufflation. Enteric tube tip terminates within the duodenal bulb. Scattered colonic diverticulosis. The cecum is noted to be located within the right upper abdominal quadrant. Normal noncontrast appearance of the terminal ileum and appendix. No pneumoperitoneum, pneumatosis or portal venous gas. Vascular/Lymphatic: Aneurysmal dilatation of the infrarenal abdominal aorta measuring approximately 3.0 x 3.0 x 3.0 cm as measured in greatest oblique short axis axial (image 41, series 3), coronal (image 34, series 6) and sagittal (image 60, series 7) dimensions respectively. There is a moderate to large amount of eccentric mixed calcified and noncalcified atherosclerotic plaque throughout the abdominal aorta. Mild ectasia of the right common iliac artery measuring 1.6 cm in diameter. No bulky retroperitoneal or mesenteric lymphadenopathy on this noncontrast examination. Other: Mild diffuse body wall anasarca. Musculoskeletal: No acute or aggressive osseous abnormalities. Moderate severe multilevel lumbar spine DDD, worse at L1-L2, and a lesser extent, L3-L4 and L5-S1 with disc space height loss, endplate irregularity and sclerosis. Grade 1  anterolisthesis of L3 upon L4 measuring approximately 4 mm. No associated pars defects. IMPRESSION: 1. Gastric anatomy amenable to potential percutaneous gastrostomy tube placement as indicated. 2. Cholelithiasis without evidence of cholecystitis. 3. Colonic diverticulosis without evidence of diverticulitis. 4. Improved aeration of the lung bases compared to the 06/01/2020 examination with persistent bibasilar ground-glass interstitial opacities and subpleural nodular opacities, nonspecific though compatible with provided history of COVID-19 infection.  5. Aneurysmal dilatation of the infrarenal abdominal aorta measuring 3.0 cm in diameter. Recommend follow-up aortic ultrasound in 3 years. This recommendation follows ACR consensus guidelines: White Paper of the ACR Incidental Findings Committee II on Vascular Findings. J Am Coll Radiol 2013; 10:789-794. 6. Coronary calcifications.  Aortic Atherosclerosis (ICD10-I70.0). Electronically Signed   By: Sandi Mariscal M.D.   On: 06/11/2020 16:37       LOS: 12 days   Phillips Climes MD  [facility] Pager on [email or web address]  06/13/2020, 1:15 PM

## 2020-06-13 NOTE — Progress Notes (Addendum)
ANTICOAGULATION CONSULT NOTE - Follow Up Consult  Pharmacy Consult for IV Heparin Indication: Atrial fibrillation  Labs: Recent Labs    06/11/20 0451 06/11/20 1317 06/12/20 0111 06/12/20 1645 06/13/20 0045  HGB 14.7  --  15.6  --  14.9  HCT 42.3  --  45.0  --  44.2  PLT 165  --  152  --  110*  HEPARINUNFRC 0.56   < > 0.65 0.41 0.43  CREATININE 0.76  --  0.82  --  0.70   < > = values in this interval not displayed.     Assessment: 86 yr old male with no significant medical history known presented with COVID, then developed a new embolic stroke and atrial fibrillation. Pharmacy was consulted for heparin dosing/monitoring; discussed with primary team and Dr. Erlinda Hong; will start stroke protocol due to stroke diagnosis (low goal and no heparin boluses).   Patient was on therapeutic enoxaparin; last dose in AM 1/24. Heparin level in range this AM at 0.43. We will f/u in AM  Goal of Therapy:  Heparin level: 0.3-0.5 units/ml Monitor platelets by anticoagulation protocol: Yes   Plan:  Cont heparin infusion 700 units/hr Monitor daily heparin level, CBC Monitor for bleeding  Oda Placke A. Levada Dy, PharmD, BCPS, FNKF Clinical Pharmacist [place] Please utilize Amion for appropriate phone number to reach the unit pharmacist ([place])   Addendum: Patient now with concern for HITT per MD. 4T score was 4 (intermediate risk). HITT antibody ordered and MD wishes to change patient to fondaparinux as an alternative. Dose will be 7.5mg  SQ q24h and will start at once heparin is turned off. CrCl 67.   1. Stop heparin 2. Fondaparinux 7.5mg  SQ q24h 3. F/u HIT assay results.   Berdena Cisek A. Levada Dy, PharmD, BCPS, FNKF Clinical Pharmacist [place] Please utilize Amion for appropriate phone number to reach the unit pharmacist ([place])    06/13/2020 7:59 AM

## 2020-06-13 NOTE — Progress Notes (Signed)
Physical Therapy Treatment Patient Details Name: Joshua Robinson MRN: [id number] DOB: Feb 23, 1935 Today's Date: 06/13/2020    History of Present Illness 86 year old male with no documented chronic medical problems, but is a reformed smoker of 50 pack years presenting with myalgias, coughing, and shortness of breath that began on 05/24/2020.  The patient states that he visited his PCP on a virtual visit on 05/27/2019.  He was given oral steroids and azithromycin.  His symptoms did not improve.  He continued to have worsening shortness of breath, coughing.  He did have some intermittent chest discomfort with coughing but denied any hemoptysis.  He had some loose stools but did not have any hematochezia or melena.  He also had subjective fevers and chills at home.  He has had 2 doses of the Moderna vaccination but has not been boosted.  EMS was activated and the patient was noted to have oxygen saturation 67% on room air.  He was placed on CPAP and brought to the hospital.  In the emergency department, the patient was subsequently placed on high flow nasal cannula.  He did not tolerate it well and was placed on BiPAP.  He has been subsequently transitioned to heated high flow at 40 L.  The patient was given Actemra and started on intravenous steroids.  In addition, the patient was noted to have atrial fibrillation with RVR.  He was started on a diltiazem drip. New onset of LUE/LLE hemiplegia due to recent right MCA sroke    PT Comments    Pt very lethargic today, limiting ability to participate in therapy session. PROM performed LUE/LE and AAROM RUE/LE. Pillows used for positioning: elevation LUE/LE, promote IR/adduction L hip, and promote midline head/neck. Pt reports being comfortable at end of session.    Follow Up Recommendations  SNF;LTACH     Equipment Recommendations  Other (comment) (defer to post acute)    Recommendations for Other Services       Precautions / Restrictions  Precautions Precautions: Fall;Other (comment) Precaution Comments: Cortrak, fragile skin, L hemi    Mobility  Bed Mobility   Bed Mobility: Rolling Rolling: Total assist            Transfers                    Ambulation/Gait                 Stairs             Wheelchair Mobility    Modified Rankin (Stroke Patients Only) Modified Rankin (Stroke Patients Only) Pre-Morbid Rankin Score: No symptoms Modified Rankin: Severe disability     Balance                                            Cognition Arousal/Alertness: Lethargic Behavior During Therapy: Flat affect Overall Cognitive Status: Difficult to assess                                 General Comments: L neglect, consistently following one-step commands, answering simple yes/no questions appropriately      Exercises Other Exercises Other Exercises: PROM LUE/LE Other Exercises: AAROM RUE/LE Other Exercises: VSS on RA    General Comments        Pertinent Vitals/Pain Pain Assessment: Faces Faces Pain Scale: No  hurt    Home Living                      Prior Function            PT Goals (current goals can now be found in the care plan section) Acute Rehab PT Goals Patient Stated Goal: not stated Progress towards PT goals: Not progressing toward goals - comment (lethargy/fatigue)    Frequency    Min 3X/week      PT Plan Current plan remains appropriate    Co-evaluation              AM-PAC PT "6 Clicks" Mobility   Outcome Measure  Help needed turning from your back to your side while in a flat bed without using bedrails?: Total Help needed moving from lying on your back to sitting on the side of a flat bed without using bedrails?: Total Help needed moving to and from a bed to a chair (including a wheelchair)?: Total Help needed standing up from a chair using your arms (e.g., wheelchair or bedside chair)?: Total Help  needed to walk in hospital room?: Total Help needed climbing 3-5 steps with a railing? : Total 6 Click Score: 6    End of Session   Activity Tolerance: Patient limited by fatigue;Patient limited by lethargy Patient left: in bed;with call bell/phone within reach;with bed alarm set Nurse Communication: Mobility status;Need for lift equipment PT Visit Diagnosis: Muscle weakness (generalized) (M62.81);Hemiplegia and hemiparesis;Other abnormalities of gait and mobility (R26.89) Hemiplegia - Right/Left: Left Hemiplegia - caused by: Cerebral infarction     Time: 5573-2202 PT Time Calculation (min) (ACUTE ONLY): 18 min  Charges:  $Therapeutic Exercise: 8-22 mins                     Lorrin Goodell, PT  Office # [phone] Pager [phone]    Lorriane Shire 06/13/2020, 12:11 PM

## 2020-06-13 NOTE — Progress Notes (Signed)
Nutrition Follow-up  DOCUMENTATION CODES:   Not applicable  INTERVENTION:  Honey thick liquids PO via spoon per SLP.  Continue Osmolite 1.5 cal via Cortrak NGT at goal rate of 50 ml/hr.   Continue 45 ml Prosource TF BID per tube.    Free water flushes of 100 ml q 6 hours per tube.   Tube feeding regimen provides 1880 kcal (100% of needs), 97 grams of protein, and 1312 ml of H2O.   NUTRITION DIAGNOSIS:   Inadequate oral intake related to acute illness (acute respiratory failure with hypoxia due to Covid PNA) as evidenced by NPO status; ongoing  GOAL:   Patient will meet greater than or equal to 90% of their needs; met with TF  MONITOR:   TF tolerance,Diet advancement,Skin,Weight trends,Labs,I & O's  REASON FOR ASSESSMENT:   NPO/Clear Liquid Diet    ASSESSMENT:   86 year old male admitted with acute respiratory failure with hypoxia secondary to pneumonia due to Covid-19 virus. Pt is a reformed smoker without any significant past medical history presented with one week history of significant dyspnea and productive cough with green sputum. Pt with acute embolic right MCA stroke/acute metabolic encephalopathy. Cortrak tube placed 1/26. Tip of tube in stomach.   Pt underwent FEES today. Pt with moderate aspiration risk. Diet has been advanced to honey thick liquids via spoon. Per SLP, may be able to upgrade to puree as strength improves. Plan for PEG if dysphagia does not improve. Will need to wait for 21 days isolation period prior to possible PEG placement per IR. Plans to continue current tube feeding orders. RD to modify tube feeds if diet advances/PO improves.   Labs and medications reviewed.   Diet Order:   Diet Order            Diet full liquid Room service appropriate? No; Fluid consistency: Honey Thick  Diet effective now                 EDUCATION NEEDS:   No education needs have been identified at this time  Skin:  Skin Assessment: Reviewed RN  Assessment  Last BM:  1/25  Height:   Ht Readings from Last 1 Encounters:  06/08/20 5' 8"  (1.727 m)    Weight:   Wt Readings from Last 1 Encounters:  06/13/20 69.8 kg   BMI:  Body mass index is 23.4 kg/m.  Estimated Nutritional Needs:   Kcal:  1850-2050  Protein:  85-100 grams  Fluid:  >/= 1.8 L/day  Corrin Parker, MS, RD, LDN RD pager number/after hours weekend pager number on Amion.

## 2020-06-14 DIAGNOSIS — J9601 Acute respiratory failure with hypoxia: Secondary | ICD-10-CM | POA: Diagnosis not present

## 2020-06-14 DIAGNOSIS — I4891 Unspecified atrial fibrillation: Secondary | ICD-10-CM | POA: Diagnosis not present

## 2020-06-14 LAB — COMPREHENSIVE METABOLIC PANEL
ALT: 193 U/L — ABNORMAL HIGH (ref 0–44)
AST: 44 U/L — ABNORMAL HIGH (ref 15–41)
Albumin: 2.5 g/dL — ABNORMAL LOW (ref 3.5–5.0)
Alkaline Phosphatase: 75 U/L (ref 38–126)
Anion gap: 8 (ref 5–15)
BUN: 33 mg/dL — ABNORMAL HIGH (ref 8–23)
CO2: 25 mmol/L (ref 22–32)
Calcium: 8 mg/dL — ABNORMAL LOW (ref 8.9–10.3)
Chloride: 105 mmol/L (ref 98–111)
Creatinine, Ser: 0.71 mg/dL (ref 0.61–1.24)
GFR, Estimated: >60 mL/min (ref >60)
Glucose, Bld: 115 mg/dL — ABNORMAL HIGH (ref 70–99)
Potassium: 4.8 mmol/L (ref 3.5–5.1)
Sodium: 138 mmol/L (ref 135–145)
Total Bilirubin: 1.9 mg/dL — ABNORMAL HIGH (ref 0.3–1.2)
Total Protein: 5 g/dL — ABNORMAL LOW (ref 6.5–8.1)

## 2020-06-14 LAB — GLUCOSE, CAPILLARY
Glucose-Capillary: 110 mg/dL — ABNORMAL HIGH (ref 70–99)
Glucose-Capillary: 112 mg/dL — ABNORMAL HIGH (ref 70–99)
Glucose-Capillary: 112 mg/dL — ABNORMAL HIGH (ref 70–99)
Glucose-Capillary: 123 mg/dL — ABNORMAL HIGH (ref 70–99)
Glucose-Capillary: 125 mg/dL — ABNORMAL HIGH (ref 70–99)
Glucose-Capillary: 93 mg/dL (ref 70–99)

## 2020-06-14 LAB — CBC
HCT: 43.4 % (ref 39.0–52.0)
Hemoglobin: 15.3 g/dL (ref 13.0–17.0)
MCH: 33.7 pg (ref 26.0–34.0)
MCHC: 35.3 g/dL (ref 30.0–36.0)
MCV: 95.6 fL (ref 80.0–100.0)
Platelets: 142 10*3/uL — ABNORMAL LOW (ref 150–400)
RBC: 4.54 MIL/uL (ref 4.22–5.81)
RDW: 13.8 % (ref 11.5–15.5)
WBC: 16.2 10*3/uL — ABNORMAL HIGH (ref 4.0–10.5)
nRBC: 0 % (ref 0.0–0.2)

## 2020-06-14 LAB — HEPARIN LEVEL (UNFRACTIONATED): Heparin Unfractionated: 0.73 IU/mL — ABNORMAL HIGH (ref 0.30–0.70)

## 2020-06-14 LAB — HEPARIN INDUCED PLATELET AB (HIT ANTIBODY): Heparin Induced Plt Ab: 0.088 OD (ref 0.000–0.400)

## 2020-06-14 MED ORDER — FREE WATER
100.0000 mL | Freq: Three times a day (TID) | Status: DC
  Administered 2020-06-14 – 2020-06-16 (×6): 100 mL

## 2020-06-14 NOTE — Progress Notes (Signed)
PROGRESS NOTE  Joshua Robinson [id number] DOB: 21-Aug-1934 DOA: 06/01/2020  PCP: Redmond School, MD  Brief History/Interval Summary:   - 86 year-old male with no documented chronic medical problems, but is a reformed smoker of 50 pack years presenting with myalgias, coughing, and shortness of breath that began on 05/24/2020.The patient states that he visited his PCP on a virtual visit on 05/27/2019. He was given oral steroids and azithromycin. His symptoms did not improve. He continued to have worsening shortness of breath, coughing. He has had 2 doses of the Moderna vaccination but has not been boosted.  Found to be positive for COVID-19. In the emergency department, the patient was subsequently placed on high flow nasal cannula. He did not tolerate it well and was placed on BiPAP. He has been subsequently transitioned to heated high flow.  Hospitalized to the intensive care unit and independent.  Respiratory status started improving however patient then developed embolic stroke.  Subsequently transferred to [facility].  Reason for Visit: Pneumonia due to COVID-19.  Acute respiratory failure with hypoxia.  Acute embolic stroke.  Consultants: Pulmonology.  Neurology  Procedures: None yet  Antibiotics: Anti-infectives (From admission, onward)   Start     Dose/Rate Route Frequency Ordered Stop   06/12/20 1600  fluconazole (DIFLUCAN) tablet 100 mg  Status:  Discontinued        100 mg Per Tube Daily 06/12/20 1507 06/13/20 1329   06/02/20 1000  cefTRIAXone (ROCEPHIN) 2 g in sodium chloride 0.9 % 100 mL IVPB  Status:  Discontinued        2 g 200 mL/hr over 30 Minutes Intravenous Every 24 hours 06/02/20 0638 06/04/20 1004   06/02/20 1000  azithromycin (ZITHROMAX) 500 mg in sodium chloride 0.9 % 250 mL IVPB  Status:  Discontinued        500 mg 250 mL/hr over 60 Minutes Intravenous Every 24 hours 06/02/20 0638 06/04/20 1004   06/02/20 0600  vancomycin (VANCOREADY) IVPB 750  mg/150 mL  Status:  Discontinued        750 mg 150 mL/hr over 60 Minutes Intravenous Every 12 hours 06/01/20 1333 06/02/20 1136   06/01/20 1245  vancomycin (VANCOREADY) IVPB 1500 mg/300 mL        1,500 mg 150 mL/hr over 120 Minutes Intravenous  Once 06/01/20 1239 06/01/20 1627   06/01/20 1230  cefTRIAXone (ROCEPHIN) 1 g in sodium chloride 0.9 % 100 mL IVPB        1 g 200 mL/hr over 30 Minutes Intravenous  Once 06/01/20 1223 06/01/20 1337   06/01/20 1230  azithromycin (ZITHROMAX) 500 mg in sodium chloride 0.9 % 250 mL IVPB        500 mg 250 mL/hr over 60 Minutes Intravenous  Once 06/01/20 1223 06/01/20 1425      Subjective/Interval History:  Significant events as discussed with staff, patient himself nodding head not to any complaints .  Assessment/Plan:  Acute Hypoxic Resp. Failure/Pneumonia due to COVID-19   Recent Labs  Lab 06/10/20 0124 06/11/20 0451 06/12/20 0111 06/13/20 0045 06/14/20 0411  ALT 351* 361* 317* 234* 193*    COVID 19 Therapeutics: Antibacterials: Antibiotics were given in the ED but not continued.  Procalcitonin was 0.19 Remdesivir: It appears that he did not receive Remdesivir Steroids: Treated with IV Solu-Medrol, currently tapered off. Diuretics: Not on scheduled diuretics Inhaled Steroids: None noted Actemra/Baricitinib: Patient was given Actemra PUD Prophylaxis: On Protonix DVT Prophylaxis: On therapeutic Lovenox  Tested positive on 1/16  From  a respiratory standpoint patient appears to be improving.  His oxygen requirements have gone down significantly.  Patient was treated with steroids and Actemra.  Did not get Remdesivir. Underwent CT angiogram on 1/17 which was negative for PE.  No need for antibacterials currently.  Start tapering steroids.  Acute embolic right MCA stroke/acute metabolic encephalopathy -While he was at [facility] patient was found to have an acute stroke.  Patient was transferred to [facility].   - MRI  showed right MCA stroke with tiny hemorrhagic transformation. -Carotid Doppler significant for minor carotid arthrosclerosis -The echo with EF 55 to 60% -EEG with no seizures -Encephalopathy has significantly improved, most likely in the setting of his acute CVA, he is more interactive. -Work-up significant for A. fib, not on full anticoagulation, he is currently on Arixtra full anticoagulation dose . -Recommendation for SNF placement -remains with significant dysphagia, plan for PEG, have discussed with interventional radiology, appears he has to wait for the 21 days isolation period  to finish before IR able to insert  PEG tube.  I have discussed with Dr. Benson Norway, GI on-call, GI service do not do PEG currently, as it is mainly done by IR currently .  Atrial fibrillation with RVR - Initially patient was requiring diltiazem infusion.   -Anticoagulation with Arixtra  Thrombocytopenia -Most likely in the setting of Covid, and acute illness, keep pending, meanwhile continue with Arixtra . -Improving after holding Diflucan .  Oropharyngeal dysphagia - on core track tube feed, will need PEG  Abnormal LFTs - Patient was found to have transaminitis as well as hyperbilirubinemia.   -  Right upper quadrant ultrasound does show gallstones and gallbladder sludge.  No evidence for acute cholecystitis.  -  Hepatic steatosis noted.  No biliary ductal dilatation.  -Trending down -Negative hepatitis panel -Hold statins  Hypernatremia -Volume depletion, resolved with free water via PEG, sodium 138 today, will decrease free water 200 mils every 8 hours.  Mildly elevated troponin Thought to be secondary to demand ischemia from tachyarrhythmia.  Echocardiogram showed EF to be 50 to 55% without any wall motion abnormality.  Do not anticipate any further inpatient cardiac work-up at this time.  Hyperglycemia HbA1c 5.2.  Likely due to steroids.  Seems to be stable.  Continue sliding scale  coverage.  Presumptive COPD Quit tobacco about 20 years ago.  Has a 50-pack-year history of smoking.  Continue inhalers.  Oral thrush -This has significantly improved, continue with oral care, and Magic mouthwash .  Goals of care Palliative care is following.  Patient's family currently desires full scope of care.  Prognosis however remains guarded to poor, have discussed with daughter-in-law , she wants to proceed with DNR, continue with current measures of treatment, if patient is deteriorating, or terminally ill, then they would like to proceed with comfort measures, otherwise for now continue with full scope of treatment but he will be DNR.   DVT Prophylaxis: Heparin GTT>>Arixtra Code Status: Patient is DNR . Family Communication: Daughter-in-law [name], and wife updated by phone updated by phone 1/28  Disposition Plan: Unclear for now.  Will likely need to go to skilled nursing facility  Status is: Inpatient  Remains inpatient appropriate because:Altered mental status, IV treatments appropriate due to intensity of illness or inability to take PO and Inpatient level of care appropriate due to severity of illness   Dispo: The patient is from: Home              Anticipated d/c  is to: To be determined              Anticipated d/c date is: 3 days              Patient currently is not medically stable to d/c. patient will need PEG tube inserted before discharge to facility   Difficult to place patient Yes     Medications:  Scheduled: . Chlorhexidine Gluconate Cloth  6 each Topical Daily  . feeding supplement (PROSource TF)  45 mL Per Tube BID  . folic acid  1 mg Intravenous Daily  . fondaparinux (ARIXTRA) injection  7.5 mg Subcutaneous Q24H  . free water  100 mL Per Tube Q8H  . insulin aspart  0-9 Units Subcutaneous Q4H  . magic mouthwash  10 mL Oral QID  . mouth rinse  15 mL Mouth Rinse BID  . metoprolol tartrate  2.5 mg Intravenous Q8H  . pantoprazole (PROTONIX) IV  40 mg  Intravenous QHS  . sodium chloride flush  3 mL Intravenous Q12H  . sodium chloride flush  3 mL Intravenous Q12H  . thiamine injection  100 mg Intravenous Daily   Continuous: . sodium chloride    . feeding supplement (OSMOLITE 1.5 CAL) 1,000 mL (06/12/20 0124)  . sodium chloride     UYQ:IHKVQQ chloride, acetaminophen **OR** acetaminophen, bisacodyl, chlorpheniramine-HYDROcodone, guaiFENesin-dextromethorphan, ondansetron **OR** ondansetron (ZOFRAN) IV, polyethylene glycol, polyvinyl alcohol, Resource ThickenUp Clear, sodium chloride flush   Objective:  Vital Signs  Vitals:   06/13/20 2349 06/14/20 0348 06/14/20 0727 06/14/20 1205  BP: 120/74 108/67 (!) 111/59 112/61  Pulse: 76 77 76 74  Resp: 18 17 18 18   Temp: 97.7 F (36.5 C) 97.8 F (36.6 C) 98.1 F (36.7 C) 98.7 F (37.1 C)  TempSrc: Axillary Axillary Axillary Axillary  SpO2: 97% 98% 100% 100%  Weight:      Height:        Intake/Output Summary (Last 24 hours) at 06/14/2020 1336 Last data filed at 06/14/2020 1051 Gross per 24 hour  Intake 48.91 ml  Output 1300 ml  Net -1251.09 ml   Filed Weights   06/11/20 2000 06/12/20 0235 06/13/20 0500  Weight: 69.9 kg 69.9 kg 69.8 kg   Physical exam:  Is awake, but overall extremely frail, deconditioned, laying in bed in no apparent distress, follow command, with dense left hemiparesis  Fair air entry bilaterally, with no wheezing, rales or rhonchi  Regular rate and rhythm, no rubs or gallops  +ve B.Sounds, Abd Soft, No tenderness, No rebound - guarding or rigidity. No Cyanosis, Clubbing or edema, No new Rash or bruise     Lab Results:  Data Reviewed: I have personally reviewed following labs and imaging studies  CBC: Recent Labs  Lab 06/10/20 0124 06/11/20 0451 06/12/20 0111 06/13/20 0045 06/14/20 0411  WBC 14.0* 12.1* 16.3* 12.7* 16.2*  HGB 16.2 14.7 15.6 14.9 15.3  HCT 45.7 42.3 45.0 44.2 43.4  MCV 95.6 96.4 97.8 96.1 95.6  PLT 196 165 152 110* 142*     Basic Metabolic Panel: Recent Labs  Lab 06/10/20 0124 06/11/20 0451 06/12/20 0111 06/13/20 0045 06/14/20 0411  NA 142 145 146* 141 138  K 4.2 4.4 4.8 4.6 4.8  CL 111 114* 113* 108 105  CO2 22 24 24 23 25   GLUCOSE 153* 193* 140* 90 115*  BUN 41* 42* 49* 43* 33*  CREATININE 0.73 0.76 0.82 0.70 0.71  CALCIUM 7.8* 7.7* 7.9* 7.7* 8.0*    GFR: Estimated Creatinine Clearance: 67.7  mL/min (by C-G formula based on SCr of 0.71 mg/dL).  Liver Function Tests: Recent Labs  Lab 06/10/20 0124 06/11/20 0451 06/12/20 0111 06/13/20 0045 06/14/20 0411  AST 121* 87* 64* 46* 44*  ALT 351* 361* 317* 234* 193*  ALKPHOS 73 76 76 67 75  BILITOT 6.6* 2.6* 2.5* 2.2* 1.9*  PROT 5.4* 4.9* 5.3* 5.0* 5.0*  ALBUMIN 2.7* 2.4* 2.7* 2.5* 2.5*     Coagulation Profile: No results for input(s): INR, PROTIME in the last 168 hours.   CBG: Recent Labs  Lab 06/13/20 1930 06/13/20 2353 06/14/20 0343 06/14/20 0724 06/14/20 1208  GLUCAP 87 110* 110* 125* 123*     No results found for this or any previous visit (from the past 240 hour(s)).    Radiology Studies: No results found.     LOS: 65 days   Phillips Climes MD  [facility] Pager on [email or web address]  06/14/2020, 1:36 PM

## 2020-06-15 DIAGNOSIS — J9601 Acute respiratory failure with hypoxia: Secondary | ICD-10-CM | POA: Diagnosis not present

## 2020-06-15 DIAGNOSIS — I4891 Unspecified atrial fibrillation: Secondary | ICD-10-CM | POA: Diagnosis not present

## 2020-06-15 LAB — GLUCOSE, CAPILLARY
Glucose-Capillary: 126 mg/dL — ABNORMAL HIGH (ref 70–99)
Glucose-Capillary: 118 mg/dL — ABNORMAL HIGH (ref 70–99)
Glucose-Capillary: 143 mg/dL — ABNORMAL HIGH (ref 70–99)
Glucose-Capillary: 115 mg/dL — ABNORMAL HIGH (ref 70–99)
Glucose-Capillary: 132 mg/dL — ABNORMAL HIGH (ref 70–99)

## 2020-06-15 LAB — COMPREHENSIVE METABOLIC PANEL
ALT: 164 U/L — ABNORMAL HIGH (ref 0–44)
AST: 44 U/L — ABNORMAL HIGH (ref 15–41)
Albumin: 2.5 g/dL — ABNORMAL LOW (ref 3.5–5.0)
Alkaline Phosphatase: 77 U/L (ref 38–126)
Anion gap: 9 (ref 5–15)
BUN: 34 mg/dL — ABNORMAL HIGH (ref 8–23)
CO2: 23 mmol/L (ref 22–32)
Calcium: 7.9 mg/dL — ABNORMAL LOW (ref 8.9–10.3)
Chloride: 102 mmol/L (ref 98–111)
Creatinine, Ser: 0.67 mg/dL (ref 0.61–1.24)
GFR, Estimated: >60 mL/min (ref >60)
Glucose, Bld: 138 mg/dL — ABNORMAL HIGH (ref 70–99)
Potassium: 4.7 mmol/L (ref 3.5–5.1)
Sodium: 134 mmol/L — ABNORMAL LOW (ref 135–145)
Total Bilirubin: 1.7 mg/dL — ABNORMAL HIGH (ref 0.3–1.2)
Total Protein: 5 g/dL — ABNORMAL LOW (ref 6.5–8.1)

## 2020-06-15 LAB — CBC
HCT: 44.6 % (ref 39.0–52.0)
Hemoglobin: 15 g/dL (ref 13.0–17.0)
MCH: 32.8 pg (ref 26.0–34.0)
MCHC: 33.6 g/dL (ref 30.0–36.0)
MCV: 97.4 fL (ref 80.0–100.0)
Platelets: 162 10*3/uL (ref 150–400)
RBC: 4.58 MIL/uL (ref 4.22–5.81)
RDW: 13.5 % (ref 11.5–15.5)
WBC: 18.1 10*3/uL — ABNORMAL HIGH (ref 4.0–10.5)
nRBC: 0 % (ref 0.0–0.2)

## 2020-06-15 LAB — PROCALCITONIN: Procalcitonin: <0.1 ng/mL

## 2020-06-15 MED ORDER — ENOXAPARIN SODIUM 80 MG/0.8ML ~~LOC~~ SOLN
70.0000 mg | Freq: Two times a day (BID) | SUBCUTANEOUS | Status: DC
  Administered 2020-06-15 – 2020-06-16 (×3): 70 mg via SUBCUTANEOUS
  Filled 2020-06-15 (×2): qty 0.8

## 2020-06-15 NOTE — Progress Notes (Signed)
Sent note to Dr. Johnette Abraham:  Joshua Robinson in chair on Facetime with his family.  Joshua Robinson states he is happy sitting up and feels good!  Speaking more, and more clearly than yesterday!

## 2020-06-15 NOTE — Plan of Care (Signed)
  Problem: Education: Goal: Knowledge of General Education information will improve Description: Including pain rating scale, medication(s)/side effects and non-pharmacologic comfort measures Outcome: Progressing   Problem: Health Behavior/Discharge Planning: Goal: Ability to manage health-related needs will improve Outcome: Progressing   Problem: Clinical Measurements: Goal: Ability to maintain clinical measurements within normal limits will improve Outcome: Progressing Goal: Will remain free from infection Outcome: Progressing Goal: Diagnostic test results will improve Outcome: Progressing Goal: Respiratory complications will improve Outcome: Progressing Goal: Cardiovascular complication will be avoided Outcome: Progressing   Problem: Nutrition: Goal: Adequate nutrition will be maintained Outcome: Progressing   Problem: Coping: Goal: Level of anxiety will decrease Outcome: Progressing   Problem: Elimination: Goal: Will not experience complications related to bowel motility Outcome: Progressing Goal: Will not experience complications related to urinary retention Outcome: Progressing   Problem: Pain Managment: Goal: General experience of comfort will improve Outcome: Progressing   Problem: Safety: Goal: Ability to remain free from injury will improve Outcome: Progressing   Problem: Skin Integrity: Goal: Risk for impaired skin integrity will decrease Outcome: Progressing   Problem: Education: Goal: Knowledge of disease or condition will improve Outcome: Progressing Goal: Knowledge of secondary prevention will improve Outcome: Progressing Goal: Knowledge of patient specific risk factors addressed and post discharge goals established will improve Outcome: Progressing Goal: Individualized Educational Video(s) Outcome: Progressing   Problem: Coping: Goal: Will verbalize positive feelings about self Outcome: Progressing   Problem: Health Behavior/Discharge  Planning: Goal: Ability to manage health-related needs will improve Outcome: Progressing   Problem: Nutrition: Goal: Dietary intake will improve Outcome: Progressing   Problem: Ischemic Stroke/TIA Tissue Perfusion: Goal: Complications of ischemic stroke/TIA will be minimized Outcome: Progressing   Problem: Education: Goal: Knowledge of risk factors and measures for prevention of condition will improve Outcome: Progressing   Problem: Education: Goal: Knowledge of secondary prevention will improve Outcome: Progressing   Problem: Activity: Goal: Risk for activity intolerance will decrease Outcome: Not Progressing   Problem: Self-Care: Goal: Ability to participate in self-care as condition permits will improve Outcome: Not Progressing Goal: Verbalization of feelings and concerns over difficulty with self-care will improve Outcome: Not Progressing Goal: Ability to communicate needs accurately will improve Outcome: Not Progressing

## 2020-06-15 NOTE — Progress Notes (Signed)
PROGRESS NOTE  Joshua Robinson [id number] DOB: 22-Sep-1934 DOA: 06/01/2020  PCP: Redmond School, MD  Brief History/Interval Summary:   - 86 year-old male with no documented chronic medical problems, but is a reformed smoker of 50 pack years presenting with myalgias, coughing, and shortness of breath that began on 05/24/2020.The patient states that he visited his PCP on a virtual visit on 05/27/2019. He was given oral steroids and azithromycin. His symptoms did not improve. He continued to have worsening shortness of breath, coughing. He has had 2 doses of the Moderna vaccination but has not been boosted.  Found to be positive for COVID-19. In the emergency department, the patient was subsequently placed on high flow nasal cannula. He did not tolerate it well and was placed on BiPAP. He has been subsequently transitioned to heated high flow.  Hospitalized to the intensive care unit and independent.  Respiratory status started improving however patient then developed embolic stroke.  Subsequently transferred to [facility].  Reason for Visit: Pneumonia due to COVID-19.  Acute respiratory failure with hypoxia.  Acute embolic stroke.  Consultants: Pulmonology.  Neurology, IR, D/W surgery and GI  Procedures: None yet  Antibiotics: Anti-infectives (From admission, onward)   Start     Dose/Rate Route Frequency Ordered Stop   06/12/20 1600  fluconazole (DIFLUCAN) tablet 100 mg  Status:  Discontinued        100 mg Per Tube Daily 06/12/20 1507 06/13/20 1329   06/02/20 1000  cefTRIAXone (ROCEPHIN) 2 g in sodium chloride 0.9 % 100 mL IVPB  Status:  Discontinued        2 g 200 mL/hr over 30 Minutes Intravenous Every 24 hours 06/02/20 0638 06/04/20 1004   06/02/20 1000  azithromycin (ZITHROMAX) 500 mg in sodium chloride 0.9 % 250 mL IVPB  Status:  Discontinued        500 mg 250 mL/hr over 60 Minutes Intravenous Every 24 hours 06/02/20 0638 06/04/20 1004   06/02/20 0600  vancomycin  (VANCOREADY) IVPB 750 mg/150 mL  Status:  Discontinued        750 mg 150 mL/hr over 60 Minutes Intravenous Every 12 hours 06/01/20 1333 06/02/20 1136   06/01/20 1245  vancomycin (VANCOREADY) IVPB 1500 mg/300 mL        1,500 mg 150 mL/hr over 120 Minutes Intravenous  Once 06/01/20 1239 06/01/20 1627   06/01/20 1230  cefTRIAXone (ROCEPHIN) 1 g in sodium chloride 0.9 % 100 mL IVPB        1 g 200 mL/hr over 30 Minutes Intravenous  Once 06/01/20 1223 06/01/20 1337   06/01/20 1230  azithromycin (ZITHROMAX) 500 mg in sodium chloride 0.9 % 250 mL IVPB        500 mg 250 mL/hr over 60 Minutes Intravenous  Once 06/01/20 1223 06/01/20 1425      Subjective/Interval History:  No significant events as discussed with staff, patient himself denies any complaints today.    Assessment/Plan:     Recent Labs  Lab 06/11/20 0451 06/12/20 0111 06/13/20 0045 06/14/20 0411 06/15/20 0553  ALT 361* 317* 234* 193* 164*  PROCALCITON  --   --   --   --  <0.10   Acute Hypoxic Resp. Failure/Pneumonia due to COVID-19 Antibacterials: Antibiotics were given in the ED but not continued.  Procalcitonin was 0.19 Remdesivir: It appears that he did not receive Remdesivir Steroids: Treated with IV Solu-Medrol, currently tapered off. Diuretics: Not on scheduled diuretics Inhaled Steroids: None noted Actemra/Baricitinib: Patient was given  Actemra PUD Prophylaxis: On Protonix DVT Prophylaxis: On therapeutic Lovenox  Tested positive on 1/16  From a respiratory standpoint patient appears to be improving.  His oxygen requirements have gone down significantly.  Patient was treated with steroids and Actemra.  Did not get Remdesivir. Underwent CT angiogram on 1/17 which was negative for PE.  No need for antibacterials currently.  Start tapering steroids.  Acute embolic right MCA stroke/acute metabolic encephalopathy -While he was at [facility] patient was found to have an acute stroke.  Patient was  transferred to [facility].   - MRI showed right MCA stroke with tiny hemorrhagic transformation. -Carotid Doppler significant for minor carotid arthrosclerosis -The echo with EF 55 to 60% -EEG with no seizures -Encephalopathy has significantly improved, most likely in the setting of his acute CVA, he is more interactive. -Work-up significant for A. fib, not on full anticoagulation, he is currently on Arixtra full anticoagulation dose . -Recommendation for SNF placement -remains with significant dysphagia, plan for PEG, have discussed with interventional radiology, appears he has to wait for the 21 days isolation period  to finish before IR able to insert  PEG tube.  I have discussed with Dr. Benson Norway, GI on-call, GI service do not do PEG currently, as it is mainly done by IR currently . Have discussed with general surgery, they will assess tomorrow to see if they can place PEG.  Atrial fibrillation with RVR - Initially patient was requiring diltiazem infusion.   -On Lovenox full dose  Thrombocytopenia -Most likely in the setting of Covid, and acute illness, will possibly related Diflucan. -HIT is negative. -Arixtra, currently on Lovenox.  Oropharyngeal dysphagia - on core track tube feed, will need PEG, general surgery will evaluate tomorrow -He was started on second full liquid yesterday, but he has significant coughing while being fed, so he is back to n.p.o. -No signs of aspiration, but leukocytosis trending up gradually, will monitor closely.  Abnormal LFTs - Patient was found to have transaminitis as well as hyperbilirubinemia.   -  Right upper quadrant ultrasound does show gallstones and gallbladder sludge.  No evidence for acute cholecystitis.  -  Hepatic steatosis noted.  No biliary ductal dilatation.  -Trending down -Negative hepatitis panel -Hold statins  Hypernatremia -Resolved  Mildly elevated troponin Thought to be secondary to demand ischemia from  tachyarrhythmia.  Echocardiogram showed EF to be 50 to 55% without any wall motion abnormality.  Do not anticipate any further inpatient cardiac work-up at this time.  Hyperglycemia HbA1c 5.2.  Likely due to steroids.  Seems to be stable.  Continue sliding scale coverage.  Presumptive COPD Quit tobacco about 20 years ago.  Has a 50-pack-year history of smoking.  Continue inhalers.  Oral thrush -resolved with  Magic mouthwash .  Goals of care Palliative care is following.  Patient's family currently desires full scope of care.  Prognosis however remains guarded to poor, have discussed with daughter-in-law , she wants to proceed with DNR, continue with current measures of treatment, if patient is deteriorating, or terminally ill, then they would like to proceed with comfort measures, otherwise for now continue with full scope of treatment but he will be DNR.   DVT Prophylaxis: Heparin GTT>>Arixtra>> lovenox Code Status: Patient is DNR . Family Communication: Daughter-in-law [name], by phone on 1/30   [phone] Disposition Plan: Unclear for now.  Will likely need to go to skilled nursing facility  Status is: Inpatient  Remains inpatient appropriate because:Altered mental status, IV  treatments appropriate due to intensity of illness or inability to take PO and Inpatient level of care appropriate due to severity of illness   Dispo: The patient is from: Home              Anticipated d/c is to: To be determined              Anticipated d/c date is: 3 days              Patient currently is not medically stable to d/c. patient will need PEG tube inserted before discharge to facility   Difficult to place patient Yes     Medications:  Scheduled: . Chlorhexidine Gluconate Cloth  6 each Topical Daily  . enoxaparin (LOVENOX) injection  70 mg Subcutaneous Q12H  . feeding supplement (PROSource TF)  45 mL Per Tube BID  . folic acid  1 mg Intravenous Daily  . free water  100 mL Per Tube Q8H   . insulin aspart  0-9 Units Subcutaneous Q4H  . magic mouthwash  10 mL Oral QID  . mouth rinse  15 mL Mouth Rinse BID  . metoprolol tartrate  2.5 mg Intravenous Q8H  . pantoprazole (PROTONIX) IV  40 mg Intravenous QHS  . sodium chloride flush  3 mL Intravenous Q12H  . sodium chloride flush  3 mL Intravenous Q12H  . thiamine injection  100 mg Intravenous Daily   Continuous: . sodium chloride    . feeding supplement (OSMOLITE 1.5 CAL) 50 mL/hr at 06/15/20 0530   LKG:MWNUUV chloride, acetaminophen **OR** acetaminophen, bisacodyl, chlorpheniramine-HYDROcodone, guaiFENesin-dextromethorphan, ondansetron **OR** ondansetron (ZOFRAN) IV, polyethylene glycol, polyvinyl alcohol, Resource ThickenUp Clear, sodium chloride flush   Objective:  Vital Signs  Vitals:   06/14/20 2342 06/15/20 0424 06/15/20 0714 06/15/20 1207  BP: 121/64 128/72 (!) 110/52 124/74  Pulse: 75 81 69 78  Resp: 16 19 18 20   Temp: 98.6 F (37 C) 98.7 F (37.1 C) 97.8 F (36.6 C) 98 F (36.7 C)  TempSrc: Axillary Axillary Axillary Axillary  SpO2: 97% 97% 97% 99%  Weight:      Height:        Intake/Output Summary (Last 24 hours) at 06/15/2020 1315 Last data filed at 06/15/2020 0530 Gross per 24 hour  Intake 1087 ml  Output 600 ml  Net 487 ml   Filed Weights   06/11/20 2000 06/12/20 0235 06/13/20 0500  Weight: 69.9 kg 69.9 kg 69.8 kg   Physical exam:  Awake , coherent, he is extremely deconditioned, frail, with dense left sided hemiparesis and aphasia  Symmetrical Chest wall movement, Good air movement bilaterally, CTAB RRR,No Gallops,Rubs or new Murmurs, No Parasternal Heave +ve B.Sounds, Abd Soft, No tenderness, No rebound - guarding or rigidity. No Cyanosis, Clubbing or edema, No new Rash or bruise      Lab Results:  Data Reviewed: I have personally reviewed following labs and imaging studies  CBC: Recent Labs  Lab 06/11/20 0451 06/12/20 0111 06/13/20 0045 06/14/20 0411 06/15/20 0553  WBC  12.1* 16.3* 12.7* 16.2* 18.1*  HGB 14.7 15.6 14.9 15.3 15.0  HCT 42.3 45.0 44.2 43.4 44.6  MCV 96.4 97.8 96.1 95.6 97.4  PLT 165 152 110* 142* 253    Basic Metabolic Panel: Recent Labs  Lab 06/11/20 0451 06/12/20 0111 06/13/20 0045 06/14/20 0411 06/15/20 0553  NA 145 146* 141 138 134*  K 4.4 4.8 4.6 4.8 4.7  CL 114* 113* 108 105 102  CO2 24 24 23 25 23   GLUCOSE  193* 140* 90 115* 138*  BUN 42* 49* 43* 33* 34*  CREATININE 0.76 0.82 0.70 0.71 0.67  CALCIUM 7.7* 7.9* 7.7* 8.0* 7.9*    GFR: Estimated Creatinine Clearance: 67.7 mL/min (by C-G formula based on SCr of 0.67 mg/dL).  Liver Function Tests: Recent Labs  Lab 06/11/20 0451 06/12/20 0111 06/13/20 0045 06/14/20 0411 06/15/20 0553  AST 87* 64* 46* 44* 44*  ALT 361* 317* 234* 193* 164*  ALKPHOS 76 76 67 75 77  BILITOT 2.6* 2.5* 2.2* 1.9* 1.7*  PROT 4.9* 5.3* 5.0* 5.0* 5.0*  ALBUMIN 2.4* 2.7* 2.5* 2.5* 2.5*     Coagulation Profile: No results for input(s): INR, PROTIME in the last 168 hours.   CBG: Recent Labs  Lab 06/14/20 2016 06/14/20 2345 06/15/20 0421 06/15/20 0719 06/15/20 1209  GLUCAP 112* 112* 126* 118* 143*     No results found for this or any previous visit (from the past 240 hour(s)).    Radiology Studies: No results found.     LOS: 33 days   Phillips Climes MD  [facility] Pager on [email or web address]  06/15/2020, 1:15 PM

## 2020-06-15 NOTE — Progress Notes (Signed)
[place] for Arixtra to Lovenox Indication: Atrial fibrillation  Labs: Recent Labs    06/12/20 1645 06/13/20 0045 06/13/20 0045 06/14/20 0411 06/15/20 0553  HGB  --  14.9   < > 15.3 15.0  HCT  --  44.2  --  43.4 44.6  PLT  --  110*  --  142* 162  HEPARINUNFRC 0.41 0.43  --  0.73*  --   CREATININE  --  0.70  --  0.71 0.67   < > = values in this interval not displayed.     Assessment: 86 yr old male with no significant medical history known presented with COVID, then developed a new embolic stroke and atrial fibrillation. Heparin Ab negative for HiTT  Will switch Arixtra to Lovenox  Goal of Therapy:  Monitor platelets by anticoagulation protocol: Yes   Plan:  Lovenox 70 mg sq Q 12 hours Watch CBC  Thank you Anette Guarneri, PharmD  06/15/2020 9:48 AM

## 2020-06-16 LAB — COMPREHENSIVE METABOLIC PANEL
ALT: 147 U/L — ABNORMAL HIGH (ref 0–44)
AST: 41 U/L (ref 15–41)
Albumin: 2.6 g/dL — ABNORMAL LOW (ref 3.5–5.0)
Alkaline Phosphatase: 80 U/L (ref 38–126)
Anion gap: 10 (ref 5–15)
BUN: 37 mg/dL — ABNORMAL HIGH (ref 8–23)
CO2: 23 mmol/L (ref 22–32)
Calcium: 8.1 mg/dL — ABNORMAL LOW (ref 8.9–10.3)
Chloride: 101 mmol/L (ref 98–111)
Creatinine, Ser: 0.75 mg/dL (ref 0.61–1.24)
GFR, Estimated: >60 mL/min (ref >60)
Glucose, Bld: 96 mg/dL (ref 70–99)
Potassium: 4.9 mmol/L (ref 3.5–5.1)
Sodium: 134 mmol/L — ABNORMAL LOW (ref 135–145)
Total Bilirubin: 1.7 mg/dL — ABNORMAL HIGH (ref 0.3–1.2)
Total Protein: 5.3 g/dL — ABNORMAL LOW (ref 6.5–8.1)

## 2020-06-16 LAB — CBC
HCT: 47.6 % (ref 39.0–52.0)
Hemoglobin: 15 g/dL (ref 13.0–17.0)
MCH: 33.6 pg (ref 26.0–34.0)
MCHC: 31.5 g/dL (ref 30.0–36.0)
MCV: 106.5 fL — ABNORMAL HIGH (ref 80.0–100.0)
Platelets: 135 10*3/uL — ABNORMAL LOW (ref 150–400)
RBC: 4.47 MIL/uL (ref 4.22–5.81)
RDW: 14.3 % (ref 11.5–15.5)
WBC: 15.3 10*3/uL — ABNORMAL HIGH (ref 4.0–10.5)

## 2020-06-16 LAB — GLUCOSE, CAPILLARY
Glucose-Capillary: 109 mg/dL — ABNORMAL HIGH (ref 70–99)
Glucose-Capillary: 120 mg/dL — ABNORMAL HIGH (ref 70–99)
Glucose-Capillary: 126 mg/dL — ABNORMAL HIGH (ref 70–99)
Glucose-Capillary: 127 mg/dL — ABNORMAL HIGH (ref 70–99)
Glucose-Capillary: 130 mg/dL — ABNORMAL HIGH (ref 70–99)
Glucose-Capillary: 136 mg/dL — ABNORMAL HIGH (ref 70–99)

## 2020-06-16 MED ORDER — ENOXAPARIN SODIUM 80 MG/0.8ML ~~LOC~~ SOLN: 70.0000 mg | Freq: Two times a day (BID) | SUBCUTANEOUS | Status: DC

## 2020-06-16 MED ORDER — SODIUM CHLORIDE 0.9 % IV SOLN
INTRAVENOUS | Status: DC
  Administered 2020-06-17: 1000 mL via INTRAVENOUS

## 2020-06-16 NOTE — Plan of Care (Signed)
  Problem: Safety: Goal: Non-violent Restraint(s) Outcome: Progressing   Problem: Education: Goal: Knowledge of General Education information will improve Description: Including pain rating scale, medication(s)/side effects and non-pharmacologic comfort measures Outcome: Progressing   Problem: Clinical Measurements: Goal: Ability to maintain clinical measurements within normal limits will improve Outcome: Progressing Goal: Will remain free from infection Outcome: Progressing Goal: Diagnostic test results will improve Outcome: Progressing Goal: Respiratory complications will improve Outcome: Progressing Goal: Cardiovascular complication will be avoided Outcome: Progressing   Problem: Nutrition: Goal: Adequate nutrition will be maintained Outcome: Progressing   Problem: Coping: Goal: Level of anxiety will decrease Outcome: Progressing   Problem: Elimination: Goal: Will not experience complications related to bowel motility Outcome: Progressing Goal: Will not experience complications related to urinary retention Outcome: Progressing   Problem: Pain Managment: Goal: General experience of comfort will improve Outcome: Progressing   Problem: Safety: Goal: Ability to remain free from injury will improve Outcome: Progressing   Problem: Skin Integrity: Goal: Risk for impaired skin integrity will decrease Outcome: Progressing   Problem: Education: Goal: Knowledge of disease or condition will improve Outcome: Progressing Goal: Knowledge of secondary prevention will improve Outcome: Progressing Goal: Knowledge of patient specific risk factors addressed and post discharge goals established will improve Outcome: Progressing Goal: Individualized Educational Video(s) Outcome: Progressing   Problem: Coping: Goal: Will verbalize positive feelings about self Outcome: Progressing   Problem: Health Behavior/Discharge Planning: Goal: Ability to manage health-related needs will  improve Outcome: Progressing   Problem: Self-Care: Goal: Ability to participate in self-care as condition permits will improve Outcome: Progressing Goal: Verbalization of feelings and concerns over difficulty with self-care will improve Outcome: Progressing Goal: Ability to communicate needs accurately will improve Outcome: Progressing   Problem: Nutrition: Goal: Dietary intake will improve Outcome: Progressing   Problem: Ischemic Stroke/TIA Tissue Perfusion: Goal: Complications of ischemic stroke/TIA will be minimized Outcome: Progressing   Problem: Education: Goal: Knowledge of risk factors and measures for prevention of condition will improve Outcome: Progressing   Problem: Education: Goal: Knowledge of secondary prevention will improve Outcome: Progressing   Problem: Health Behavior/Discharge Planning: Goal: Ability to manage health-related needs will improve Outcome: Not Progressing   Problem: Activity: Goal: Risk for activity intolerance will decrease Outcome: Not Progressing

## 2020-06-16 NOTE — TOC Progression Note (Signed)
Transition of Care [facility]) - Progression Note    Patient Details  Name: MAXFIELD GILDERSLEEVE MRN: [id number] Date of Birth: 1934/06/20  Transition [facility]) CM/SW [place], LCSW Phone Number: 06/16/2020, 3:11 PM  Clinical Narrative:    CSW received consult for possible SNF placement at time of discharge. CSW spoke with patient's spouse. She reported that she really wants patient to return home at discharge. CSW discussed patient's current mobility and she expressed understanding of PT recommendation. She requests time to speak with her family to see if they can arrange 24 hr care for him at home; otherwise will need to pursue SNF.     Expected Discharge Plan: [place] Barriers to Discharge: Continued Medical Work up  Expected Discharge Plan and Services Expected Discharge Plan: [place] In-house Referral: Clinical Social Work     Living arrangements for the past 2 months: Single Family Home                                       Social Determinants of Health (SDOH) Interventions    Readmission Risk Interventions No flowsheet data found.

## 2020-06-16 NOTE — Progress Notes (Addendum)
PROGRESS NOTE  Joshua Robinson [id number] DOB: 1935/05/13 DOA: 06/01/2020  PCP: Redmond School, MD  Brief History/Interval Summary:   - 86 year-old male with no documented chronic medical problems, but is a reformed smoker of 50 pack years presenting with myalgias, coughing, and shortness of breath that began on 05/24/2020.The patient states that he visited his PCP on a virtual visit on 05/27/2019. He was given oral steroids and azithromycin. His symptoms did not improve. He continued to have worsening shortness of breath, coughing. He has had 2 doses of the Moderna vaccination but has not been boosted.  Found to be positive for COVID-19. In the emergency department, the patient was subsequently placed on high flow nasal cannula. He did not tolerate it well and was placed on BiPAP. He has been subsequently transitioned to heated high flow.  Hospitalized to the intensive care unit and independent.  Respiratory status started improving however patient then developed embolic stroke.  Subsequently transferred to [facility].  Reason for Visit: Pneumonia due to COVID-19.  Acute respiratory failure with hypoxia.  Acute embolic stroke.  Consultants: Pulmonology.  Neurology, IR,  GI  Procedures: None yet  Antibiotics: Anti-infectives (From admission, onward)   Start     Dose/Rate Route Frequency Ordered Stop   06/12/20 1600  fluconazole (DIFLUCAN) tablet 100 mg  Status:  Discontinued        100 mg Per Tube Daily 06/12/20 1507 06/13/20 1329   06/02/20 1000  cefTRIAXone (ROCEPHIN) 2 g in sodium chloride 0.9 % 100 mL IVPB  Status:  Discontinued        2 g 200 mL/hr over 30 Minutes Intravenous Every 24 hours 06/02/20 0638 06/04/20 1004   06/02/20 1000  azithromycin (ZITHROMAX) 500 mg in sodium chloride 0.9 % 250 mL IVPB  Status:  Discontinued        500 mg 250 mL/hr over 60 Minutes Intravenous Every 24 hours 06/02/20 0638 06/04/20 1004   06/02/20 0600  vancomycin (VANCOREADY)  IVPB 750 mg/150 mL  Status:  Discontinued        750 mg 150 mL/hr over 60 Minutes Intravenous Every 12 hours 06/01/20 1333 06/02/20 1136   06/01/20 1245  vancomycin (VANCOREADY) IVPB 1500 mg/300 mL        1,500 mg 150 mL/hr over 120 Minutes Intravenous  Once 06/01/20 1239 06/01/20 1627   06/01/20 1230  cefTRIAXone (ROCEPHIN) 1 g in sodium chloride 0.9 % 100 mL IVPB        1 g 200 mL/hr over 30 Minutes Intravenous  Once 06/01/20 1223 06/01/20 1337   06/01/20 1230  azithromycin (ZITHROMAX) 500 mg in sodium chloride 0.9 % 250 mL IVPB        500 mg 250 mL/hr over 60 Minutes Intravenous  Once 06/01/20 1223 06/01/20 1425      Subjective/Interval History:  No significant events as discussed with staff, patient himself denies any complaints today.    Assessment/Plan:     Recent Labs  Lab 06/12/20 0111 06/13/20 0045 06/14/20 0411 06/15/20 0553 06/16/20 0247  ALT 317* 234* 193* 164* 147*  PROCALCITON  --   --   --  <0.10  --    Acute Hypoxic Resp. Failure/Pneumonia due to COVID-19 Antibacterials: Antibiotics were given in the ED but not continued.  Procalcitonin was 0.19 Remdesivir: It appears that he did not receive Remdesivir Steroids: Treated with IV Solu-Medrol, currently tapered off. Diuretics: Not on scheduled diuretics Inhaled Steroids: None noted Actemra/Baricitinib: Patient was given Actemra PUD  Prophylaxis: On Protonix DVT Prophylaxis: On therapeutic Lovenox - Tested positive on 1/16 -Respiratory failure has resolved, he is currently on room air. -  underwent CT angiogram on 1/17 which was negative for PE.  No need for antibacterials currently.  -Tapered off steroids  Acute embolic right MCA stroke/acute metabolic encephalopathy -While he was at [facility] patient was found to have an acute stroke.  Patient was transferred to [facility].   - MRI showed right MCA stroke with tiny hemorrhagic transformation. -Carotid Doppler significant for minor  carotid arthrosclerosis -The echo with EF 55 to 60% -EEG with no seizures -Encephalopathy has significantly improved, most likely in the setting of his acute CVA -Recommendation for SNF placement -On full anticoagulation for A. fib -remains with significant dysphagia, need PEG tube, general surgery to perform tomorrow, will hold Lovenox for next 24 hours .  Atrial fibrillation with RVR - Initially patient was requiring diltiazem infusion.   -On Lovenox full dose, will hold for next 24 hours for PEG tube placement  Thrombocytopenia -Most likely in the setting of Covid, and acute illness, will possibly related Diflucan. -HIT is negative.  Macrocytocis - will check Y78 and Folic acid  Oropharyngeal dysphagia - on core track tube feed, will need PEG, general surgery will evaluate tomorrow -He was started on second full liquid yesterday, but he has significant coughing while being fed, so he is back to n.p.o. -No signs of aspiration, but leukocytosis trending up gradually, will monitor closely.  Abnormal LFTs - Patient was found to have transaminitis as well as hyperbilirubinemia.   -  Right upper quadrant ultrasound does show gallstones and gallbladder sludge.  No evidence for acute cholecystitis.  -  Hepatic steatosis noted.  No biliary ductal dilatation.  -Trending down -Negative hepatitis panel -Hold statins  Hypernatremia -Resolved  Mildly elevated troponin Thought to be secondary to demand ischemia from tachyarrhythmia.  Echocardiogram showed EF to be 50 to 55% without any wall motion abnormality.  Do not anticipate any further inpatient cardiac work-up at this time.  Hyperglycemia HbA1c 5.2.  Likely due to steroids.  Seems to be stable.  Continue sliding scale coverage.  Presumptive COPD Quit tobacco about 20 years ago.  Has a 50-pack-year history of smoking.  Continue inhalers.  Oral thrush -resolved with  Magic mouthwash .  Goals of care Palliative care is  following.  Patient's family currently desires full scope of care.  Prognosis however remains guarded to poor, have discussed with daughter-in-law , she wants to proceed with DNR, continue with current measures of treatment, if patient is deteriorating, or terminally ill, then they would like to proceed with comfort measures, otherwise for now continue with full scope of treatment but he will be DNR.   DVT Prophylaxis: Heparin GTT>>Arixtra>> lovenox Code Status: Patient is DNR . Family Communication: Daughter-in-law [name], by phone on 1/31   [phone] Disposition Plan: Unclear for now.  Will likely need to go to skilled nursing facility  Status is: Inpatient  Remains inpatient appropriate because:Altered mental status, IV treatments appropriate due to intensity of illness or inability to take PO and Inpatient level of care appropriate due to severity of illness   Dispo: The patient is from: Home              Anticipated d/c is to: To be determined              Anticipated d/c date is: 3 days  Patient currently is not medically stable to d/c. patient will need PEG tube inserted before discharge to facility   Difficult to place patient Yes     Medications:  Scheduled: . Chlorhexidine Gluconate Cloth  6 each Topical Daily  . enoxaparin (LOVENOX) injection  70 mg Subcutaneous Q12H  . feeding supplement (PROSource TF)  45 mL Per Tube BID  . folic acid  1 mg Intravenous Daily  . free water  100 mL Per Tube Q8H  . insulin aspart  0-9 Units Subcutaneous Q4H  . magic mouthwash  10 mL Oral QID  . mouth rinse  15 mL Mouth Rinse BID  . metoprolol tartrate  2.5 mg Intravenous Q8H  . pantoprazole (PROTONIX) IV  40 mg Intravenous QHS  . sodium chloride flush  3 mL Intravenous Q12H  . sodium chloride flush  3 mL Intravenous Q12H  . thiamine injection  100 mg Intravenous Daily   Continuous: . sodium chloride    . feeding supplement (OSMOLITE 1.5 CAL) 50 mL/hr at 06/16/20 0600    JSH:FWYOVZ chloride, acetaminophen **OR** acetaminophen, bisacodyl, chlorpheniramine-HYDROcodone, guaiFENesin-dextromethorphan, ondansetron **OR** ondansetron (ZOFRAN) IV, polyethylene glycol, polyvinyl alcohol, Resource ThickenUp Clear, sodium chloride flush   Objective:  Vital Signs  Vitals:   06/16/20 0030 06/16/20 0416 06/16/20 0801 06/16/20 1231  BP: 125/65 112/60 107/63 104/63  Pulse: 90 82 94 100  Resp: 20 16 20 17   Temp: 98.7 F (37.1 C) 98.9 F (37.2 C) 98 F (36.7 C) 98.8 F (37.1 C)  TempSrc: Axillary Axillary Axillary Axillary  SpO2: 99% 99% 95% 97%  Weight:      Height:        Intake/Output Summary (Last 24 hours) at 06/16/2020 1352 Last data filed at 06/16/2020 0509 Gross per 24 hour  Intake 1045 ml  Output --  Net 1045 ml   Filed Weights   06/11/20 2000 06/12/20 0235 06/13/20 0500  Weight: 69.9 kg 69.9 kg 69.8 kg   Physical exam:  Awake , coherent, appropriate follow commands .frail ,  Left side hemiparesis Symmetrical Chest wall movement, Good air movement bilaterally, no wheezing RRR,No Gallops,Rubs or new Murmurs, No Parasternal Heave +ve B.Sounds, Abd Soft, No tenderness, No rebound - guarding or rigidity. No Cyanosis, Clubbing or edema, No new Rash or bruise     Lab Results:  Data Reviewed: I have personally reviewed following labs and imaging studies  CBC: Recent Labs  Lab 06/12/20 0111 06/13/20 0045 06/14/20 0411 06/15/20 0553 06/16/20 0247  WBC 16.3* 12.7* 16.2* 18.1* 15.3*  HGB 15.6 14.9 15.3 15.0 15.0  HCT 45.0 44.2 43.4 44.6 47.6  MCV 97.8 96.1 95.6 97.4 106.5*  PLT 152 110* 142* 162 135*    Basic Metabolic Panel: Recent Labs  Lab 06/12/20 0111 06/13/20 0045 06/14/20 0411 06/15/20 0553 06/16/20 0247  NA 146* 141 138 134* 134*  K 4.8 4.6 4.8 4.7 4.9  CL 113* 108 105 102 101  CO2 24 23 25 23 23   GLUCOSE 140* 90 115* 138* 96  BUN 49* 43* 33* 34* 37*  CREATININE 0.82 0.70 0.71 0.67 0.75  CALCIUM 7.9* 7.7* 8.0* 7.9*  8.1*    GFR: Estimated Creatinine Clearance: 67.7 mL/min (by C-G formula based on SCr of 0.75 mg/dL).  Liver Function Tests: Recent Labs  Lab 06/12/20 0111 06/13/20 0045 06/14/20 0411 06/15/20 0553 06/16/20 0247  AST 64* 46* 44* 44* 41  ALT 317* 234* 193* 164* 147*  ALKPHOS 76 67 75 77 80  BILITOT 2.5* 2.2* 1.9* 1.7*  1.7*  PROT 5.3* 5.0* 5.0* 5.0* 5.3*  ALBUMIN 2.7* 2.5* 2.5* 2.5* 2.6*     Coagulation Profile: No results for input(s): INR, PROTIME in the last 168 hours.   CBG: Recent Labs  Lab 06/15/20 2006 06/16/20 0033 06/16/20 0420 06/16/20 0804 06/16/20 1233  GLUCAP 132* 126* 127* 120* 136*     No results found for this or any previous visit (from the past 240 hour(s)).    Radiology Studies: No results found.     LOS: 40 days   Phillips Climes MD  [facility] Pager on [email or web address]  06/16/2020, 1:52 PM

## 2020-06-16 NOTE — Consult Note (Signed)
[facility] Surgery Consult Note  NISHAAN KINNER 08-Jan-1935  [id number].    Requesting MD: Waldron Labs, MD Chief Complaint/Reason for Consult: PEG Tube placement   HPI:  Mr. Joshua Robinson is an 86 y/o M with history of tobacco abuse and no other documented significant medical history who started having SOB and cough 05/24/2020. He had a virtual visit with his PCP and was prescribed Azithromycin and steroids without relief of symptoms. He was taken to the [facility] ED 1/16 via EMS where he was noted to be in a.fib with RVR as well as COVID positive and requiring BiPAP to maintain O2 sats. He does have a history of 2 doses Moderna vaccine. He was admitted to the ICU for management of acute respitatory failure due to [place]. On 1/21-22/2022 patient was noted to have acute onset left-sided hemiparesis, facial droop, and aphasia. CT head revealed R MCA stroke and the patient was transferred to [facility] for further management. Trauma surgery has been asked to consult for possible PEG tube in the setting of oropharyngeal dysphagia. Per chart review IR inable to perform gastrostomy tube due to COVID positive status on contact precautions. Pt currently receiving tube feeds via cortak. Palliative following for GOC, pt is DNR. Patient denies a history of abdominal surgery.  ROS: Review of Systems  Constitutional: Negative.   HENT: Negative.   Eyes: Negative.   Respiratory: Positive for cough.   Gastrointestinal: Negative.   Genitourinary: Negative.   Musculoskeletal: Negative.   Skin: Negative.   Neurological: Positive for focal weakness.  Endo/Heme/Allergies: Negative.   Psychiatric/Behavioral: Negative.     Family History  Problem Relation Age of Onset  . Diabetes Mother   . Hypertension Father     Past Medical History:  Diagnosis Date  . Arthritis   . Cancer [facility])    skin    Past Surgical History:  Procedure Laterality Date  . CATARACT EXTRACTION Right   . FOOT  SURGERY Bilateral 86 years old   "painful flat foot surgery"  . SKIN CANCER EXCISION Right    cheek  . TOTAL KNEE ARTHROPLASTY Right 02/19/2013   Procedure: RIGHT TOTAL KNEE ARTHROPLASTY;  Surgeon: Gearlean Alf, MD;  Location: [facility] ORS;  Service: Orthopedics;  Laterality: Right;    Social History:  reports that he quit smoking about 24 years ago. His smoking use included cigarettes. He quit after 40.00 years of use. His smokeless tobacco use includes snuff. He reports that he does not drink alcohol and does not use drugs.  Allergies:  Allergies  Allergen Reactions  . Bee Venom Other (See Comments)    Stung a few times and passed out    Medications Prior to Admission  Medication Sig Dispense Refill  . azithromycin (ZITHROMAX) 250 MG tablet Take by mouth.    . methylPREDNISolone (MEDROL DOSEPAK) 4 MG TBPK tablet Take by mouth.      Blood pressure 107/63, pulse 94, temperature 98 F (36.7 C), temperature source Axillary, resp. rate 20, height 5\' 8"  (1.727 m), weight 69.8 kg, SpO2 95 %. Physical Exam: Constitutional: NAD; Alert sitting in chair; facial droop, no deformites Eyes: Moist conjunctiva; no lid lag; anicteric; PERRL Neck: Trachea midline; no thyromegaly Lungs: Normal respiratory effort; no tactile fremitus CV: RRR; no palpable thrills; no pitting edema GI: Abd soft, non-tender, +BS, no palpable hepatosplenomegaly, no previous surgical scars MSK: L hemiparesis, no clubbing/cyanosis Psychiatric: Appropriate affect; alert and oriented x3 Lymphatic: No palpable cervical or axillary lymphadenopathy  Results for orders  placed or performed during the hospital encounter of 06/01/20 (from the past 48 hour(s))  Glucose, capillary     Status: Abnormal   Collection Time: 06/14/20 12:08 PM  Result Value Ref Range   Glucose-Capillary 123 (H) 70 - 99 mg/dL    Comment: Glucose reference range applies only to samples taken after fasting for at least 8 hours.  Glucose, capillary      Status: None   Collection Time: 06/14/20  5:13 PM  Result Value Ref Range   Glucose-Capillary 93 70 - 99 mg/dL    Comment: Glucose reference range applies only to samples taken after fasting for at least 8 hours.  Glucose, capillary     Status: Abnormal   Collection Time: 06/14/20  8:16 PM  Result Value Ref Range   Glucose-Capillary 112 (H) 70 - 99 mg/dL    Comment: Glucose reference range applies only to samples taken after fasting for at least 8 hours.  Glucose, capillary     Status: Abnormal   Collection Time: 06/14/20 11:45 PM  Result Value Ref Range   Glucose-Capillary 112 (H) 70 - 99 mg/dL    Comment: Glucose reference range applies only to samples taken after fasting for at least 8 hours.  Glucose, capillary     Status: Abnormal   Collection Time: 06/15/20  4:21 AM  Result Value Ref Range   Glucose-Capillary 126 (H) 70 - 99 mg/dL    Comment: Glucose reference range applies only to samples taken after fasting for at least 8 hours.  Comprehensive metabolic panel     Status: Abnormal   Collection Time: 06/15/20  5:53 AM  Result Value Ref Range   Sodium 134 (L) 135 - 145 mmol/L   Potassium 4.7 3.5 - 5.1 mmol/L   Chloride 102 98 - 111 mmol/L   CO2 23 22 - 32 mmol/L   Glucose, Bld 138 (H) 70 - 99 mg/dL    Comment: Glucose reference range applies only to samples taken after fasting for at least 8 hours.   BUN 34 (H) 8 - 23 mg/dL   Creatinine, Ser 0.67 0.61 - 1.24 mg/dL   Calcium 7.9 (L) 8.9 - 10.3 mg/dL   Total Protein 5.0 (L) 6.5 - 8.1 g/dL   Albumin 2.5 (L) 3.5 - 5.0 g/dL   AST 44 (H) 15 - 41 U/L   ALT 164 (H) 0 - 44 U/L   Alkaline Phosphatase 77 38 - 126 U/L   Total Bilirubin 1.7 (H) 0.3 - 1.2 mg/dL   GFR, Estimated >60 >60 mL/min    Comment: (NOTE) Calculated using the CKD-EPI Creatinine Equation (2021)    Anion gap 9 5 - 15    Comment: Performed at [facility] [address] [place], [place] [address]  CBC     Status: Abnormal   Collection Time: 06/15/20   5:53 AM  Result Value Ref Range   WBC 18.1 (H) 4.0 - 10.5 K/uL   RBC 4.58 4.22 - 5.81 MIL/uL   Hemoglobin 15.0 13.0 - 17.0 g/dL   HCT 44.6 39.0 - 52.0 %   MCV 97.4 80.0 - 100.0 fL   MCH 32.8 26.0 - 34.0 pg   MCHC 33.6 30.0 - 36.0 g/dL   RDW 13.5 11.5 - 15.5 %   Platelets 162 150 - 400 K/uL   nRBC 0.0 0.0 - 0.2 %    Comment: Performed at [facility], [place] [address], [place],  [address]  Procalcitonin - Baseline  Status: None   Collection Time: 06/15/20  5:53 AM  Result Value Ref Range   Procalcitonin <0.10 ng/mL    Comment:        Interpretation: PCT (Procalcitonin) <= 0.5 ng/mL: Systemic infection (sepsis) is not likely. Local bacterial infection is possible. (NOTE)       Sepsis PCT Algorithm           Lower Respiratory Tract                                      Infection PCT Algorithm    ----------------------------     ----------------------------         PCT < 0.25 ng/mL                PCT < 0.10 ng/mL          Strongly encourage             Strongly discourage   discontinuation of antibiotics    initiation of antibiotics    ----------------------------     -----------------------------       PCT 0.25 - 0.50 ng/mL            PCT 0.10 - 0.25 ng/mL               OR       >80% decrease in PCT            Discourage initiation of                                            antibiotics      Encourage discontinuation           of antibiotics    ----------------------------     -----------------------------         PCT >= 0.50 ng/mL              PCT 0.26 - 0.50 ng/mL               AND        <80% decrease in PCT             Encourage initiation of                                             antibiotics       Encourage continuation           of antibiotics    ----------------------------     -----------------------------        PCT >= 0.50 ng/mL                  PCT > 0.50 ng/mL               AND         increase in PCT                  Strongly encourage                                       initiation of antibiotics  Strongly encourage escalation           of antibiotics                                     -----------------------------                                           PCT <= 0.25 ng/mL                                                 OR                                        > 80% decrease in PCT                                      Discontinue / Do not initiate                                             antibiotics  Performed at [facility], [place] [address] [place], [place] [address]   Glucose, capillary     Status: Abnormal   Collection Time: 06/15/20  7:19 AM  Result Value Ref Range   Glucose-Capillary 118 (H) 70 - 99 mg/dL    Comment: Glucose reference range applies only to samples taken after fasting for at least 8 hours.  Glucose, capillary     Status: Abnormal   Collection Time: 06/15/20 12:09 PM  Result Value Ref Range   Glucose-Capillary 143 (H) 70 - 99 mg/dL    Comment: Glucose reference range applies only to samples taken after fasting for at least 8 hours.  Glucose, capillary     Status: Abnormal   Collection Time: 06/15/20  4:50 PM  Result Value Ref Range   Glucose-Capillary 115 (H) 70 - 99 mg/dL    Comment: Glucose reference range applies only to samples taken after fasting for at least 8 hours.  Glucose, capillary     Status: Abnormal   Collection Time: 06/15/20  8:06 PM  Result Value Ref Range   Glucose-Capillary 132 (H) 70 - 99 mg/dL    Comment: Glucose reference range applies only to samples taken after fasting for at least 8 hours.  Glucose, capillary     Status: Abnormal   Collection Time: 06/16/20 12:33 AM  Result Value Ref Range   Glucose-Capillary 126 (H) 70 - 99 mg/dL    Comment: Glucose reference range applies only to samples taken after fasting for at least 8 hours.  CBC     Status: Abnormal   Collection Time: 06/16/20  2:47 AM  Result Value Ref Range   WBC 15.3 (H) 4.0 - 10.5 K/uL    RBC 4.47 4.22 - 5.81 MIL/uL   Hemoglobin 15.0 13.0 - 17.0 g/dL   HCT 47.6 39.0 - 52.0 %   MCV 106.5 (  H) 80.0 - 100.0 fL    Comment: REPEATED TO VERIFY   MCH 33.6 26.0 - 34.0 pg   MCHC 31.5 30.0 - 36.0 g/dL   RDW 14.3 11.5 - 15.5 %   Platelets 135 (L) 150 - 400 K/uL    Comment: Performed at [facility] [address] [place], [place] [address]  Comprehensive metabolic panel     Status: Abnormal   Collection Time: 06/16/20  2:47 AM  Result Value Ref Range   Sodium 134 (L) 135 - 145 mmol/L   Potassium 4.9 3.5 - 5.1 mmol/L   Chloride 101 98 - 111 mmol/L   CO2 23 22 - 32 mmol/L   Glucose, Bld 96 70 - 99 mg/dL    Comment: Glucose reference range applies only to samples taken after fasting for at least 8 hours.   BUN 37 (H) 8 - 23 mg/dL   Creatinine, Ser 0.75 0.61 - 1.24 mg/dL   Calcium 8.1 (L) 8.9 - 10.3 mg/dL   Total Protein 5.3 (L) 6.5 - 8.1 g/dL   Albumin 2.6 (L) 3.5 - 5.0 g/dL   AST 41 15 - 41 U/L   ALT 147 (H) 0 - 44 U/L   Alkaline Phosphatase 80 38 - 126 U/L   Total Bilirubin 1.7 (H) 0.3 - 1.2 mg/dL   GFR, Estimated >60 >60 mL/min    Comment: (NOTE) Calculated using the CKD-EPI Creatinine Equation (2021)    Anion gap 10 5 - 15    Comment: Performed at [facility], [place] [address] [place], [place] [address]  Glucose, capillary     Status: Abnormal   Collection Time: 06/16/20  4:20 AM  Result Value Ref Range   Glucose-Capillary 127 (H) 70 - 99 mg/dL    Comment: Glucose reference range applies only to samples taken after fasting for at least 8 hours.  Glucose, capillary     Status: Abnormal   Collection Time: 06/16/20  8:04 AM  Result Value Ref Range   Glucose-Capillary 120 (H) 70 - 99 mg/dL    Comment: Glucose reference range applies only to samples taken after fasting for at least 8 hours.   No results found.  Assessment/Plan A.fib with RVR - resolved, on full dose Lovenox Thrombocytopenia  Metabolic encephalopathy  COPD Oral Thrush GOC - palliative  following, full scope of care currently, pt made DNR.  Acute Hypoxic Resp. Failure/Pneumonia due to COVID-19 - treated with steroids and Actemra, TRH tapering steroids, CTA 1/17 negative for PE, O2 requirement improving (currently on room air)  Acute embolic R MCA stroke with resulting L hemiplegia and oropharyngeal dysphagia - CT 1/26 looks like gastric anatomy amenable to PEG tube, no history of abdominal surgery. - PEG tube discussed with patient and he is agreeable. Will also call his family to discuss and to get written consent.  - Plan for PEG tomorrow 2/1 around 1400, in Endoscopy - hold TF at [facility], hold Lovenox at [facility].   Jill Alexanders, PA-C [facility] Please see Amion for pager number during day hours 7:00am-4:30pm 06/16/2020, 9:04 AM

## 2020-06-16 NOTE — Plan of Care (Signed)
  Problem: Education: Goal: Knowledge of General Education information will improve Description: Including pain rating scale, medication(s)/side effects and non-pharmacologic comfort measures Outcome: Progressing   Problem: Clinical Measurements: Goal: Ability to maintain clinical measurements within normal limits will improve Outcome: Progressing Goal: Will remain free from infection Outcome: Progressing Goal: Respiratory complications will improve Outcome: Progressing   Problem: Nutrition: Goal: Adequate nutrition will be maintained Outcome: Progressing   Problem: Coping: Goal: Level of anxiety will decrease Outcome: Progressing   Problem: Pain Managment: Goal: General experience of comfort will improve Outcome: Progressing   Problem: Safety: Goal: Ability to remain free from injury will improve Outcome: Progressing   Problem: Skin Integrity: Goal: Risk for impaired skin integrity will decrease Outcome: Progressing   Problem: Education: Goal: Knowledge of disease or condition will improve Outcome: Progressing Goal: Knowledge of secondary prevention will improve Outcome: Progressing Goal: Knowledge of patient specific risk factors addressed and post discharge goals established will improve Outcome: Progressing Goal: Individualized Educational Video(s) Outcome: Progressing   Problem: Coping: Goal: Will verbalize positive feelings about self Outcome: Progressing   Problem: Health Behavior/Discharge Planning: Goal: Ability to manage health-related needs will improve Outcome: Progressing   Problem: Self-Care: Goal: Verbalization of feelings and concerns over difficulty with self-care will improve Outcome: Progressing Goal: Ability to communicate needs accurately will improve Outcome: Progressing   Problem: Nutrition: Goal: Dietary intake will improve Outcome: Progressing   Problem: Ischemic Stroke/TIA Tissue Perfusion: Goal: Complications of ischemic  stroke/TIA will be minimized Outcome: Progressing   Problem: Education: Goal: Knowledge of risk factors and measures for prevention of condition will improve Outcome: Progressing   Problem: Education: Goal: Knowledge of secondary prevention will improve Outcome: Progressing   Problem: Health Behavior/Discharge Planning: Goal: Ability to manage health-related needs will improve Outcome: Not Progressing   Problem: Activity: Goal: Risk for activity intolerance will decrease Outcome: Not Progressing   Problem: Self-Care: Goal: Ability to participate in self-care as condition permits will improve Outcome: Not Progressing

## 2020-06-16 NOTE — Progress Notes (Signed)
Occupational Therapy Treatment Patient Details Name: Joshua Robinson MRN: [id number] DOB: 08/19/1934 Today's Date: 06/16/2020    History of present illness 86 year old male with no documented chronic medical problems, but is a reformed smoker of 50 pack years presenting with myalgias, coughing, and shortness of breath that began on 05/24/2020.  The patient states that he visited his PCP on a virtual visit on 05/27/2019.  He was given oral steroids and azithromycin.  His symptoms did not improve.  He continued to have worsening shortness of breath, coughing.  He did have some intermittent chest discomfort with coughing but denied any hemoptysis.  He had some loose stools but did not have any hematochezia or melena.  He also had subjective fevers and chills at home.  He has had 2 doses of the Moderna vaccination but has not been boosted.  EMS was activated and the patient was noted to have oxygen saturation 67% on room air.  He was placed on CPAP and brought to the hospital.  In the emergency department, the patient was subsequently placed on high flow nasal cannula.  He did not tolerate it well and was placed on BiPAP.  He has been subsequently transitioned to heated high flow at 40 L.  The patient was given Actemra and started on intravenous steroids.  In addition, the patient was noted to have atrial fibrillation with RVR.  He was started on a diltiazem drip. 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 Seated in recliner pt engaging in additional LE exercise and seated reaching using RUE. Pt with improved ability to scan/locate therapist placed to his L side and identify number of digits being held up in his L visual field. He remains with flaccid LUE. TotalA for pericare provided start of session prior to transfer [place]. VSS throughout on RA. Continue to recommend SNF at time of discharge. Acute  OT to follow.    Follow Up Recommendations  SNF;Supervision/Assistance - 24 hour    Equipment Recommendations  3 in 1 bedside commode          Precautions / Restrictions Precautions Precautions: Fall;Other (comment) Precaution Comments: Cortrak, fragile skin, L hemi       Mobility Bed Mobility Overal bed mobility: Needs Assistance Bed Mobility: Rolling Rolling: Total assist;Max assist         General bed mobility comments: max assist rolling L, total assist rolling R, +rail  Transfers Overall transfer level: Needs assistance               General transfer comment: +2 total assist bed to recliner with maximove    Balance                                           ADL either performed or assessed with clinical judgement   ADL Overall ADL's : Needs assistance/impaired                 Upper Body Dressing : Total assistance Upper Body Dressing Details (indicate cue type and reason): new gown         Toileting- Clothing Manipulation and Hygiene: Total assistance;+2 for physical assistance;+2 for safety/equipment;Bed level Toileting - Clothing Manipulation Details (indicate cue type and reason): assist for pericare after incontinent BM  Functional mobility during ADLs: Total assistance;+2 for physical assistance;+2 for safety/equipment (maximove) General ADL Comments: use of maximove for OOB transfer followed by therapeutic activity while seated in recliner               Cognition Arousal/Alertness: Awake/alert Behavior During Therapy: Flat affect Overall Cognitive Status: Impaired/Different from baseline Area of Impairment: Orientation;Attention;Following commands;Awareness;Problem solving;Safety/judgement                 Orientation Level: Disoriented to;Time Current Attention Level: Sustained   Following Commands: Follows one step commands consistently Safety/Judgement: Decreased awareness of safety;Decreased  awareness of deficits Awareness: Emergent Problem Solving: Slow processing;Decreased initiation;Difficulty sequencing;Requires verbal cues;Requires tactile cues General Comments: L neglect but improving        Exercises Exercises: Other exercises;General Lower Extremity General Exercises - Lower Extremity Long Arc Quad: AROM;PROM;5 reps;Seated (AROM RLE, PROM LLE) Other Exercises Other Exercises: seated reaching using RUE, working on reaching out towards R side, reaching forward and reaching across midline (including tracking past midline) towards L visual field Other Exercises: PROM LUE   Shoulder Instructions       General Comments VSS on RA    Pertinent Vitals/ Pain       Pain Assessment: No/denies pain  Home Living                                          Prior Functioning/Environment              Frequency  Min 2X/week        Progress Toward Goals  OT Goals(current goals can now be found in the care plan section)  Progress towards OT goals: Progressing toward goals  Acute Rehab OT Goals Patient Stated Goal: not stated, agreeable to OOB to recliner OT Goal Formulation: With patient/family Time For Goal Achievement: 06/23/20 Potential to Achieve Goals: Fair ADL Goals Pt Will Perform Grooming: with mod assist;sitting Pt Will Perform Upper Body Bathing: with mod assist;sitting Additional ADL Goal #1: Pt will maintain midlein postural control EOB with Mod A +2 Additional ADL Goal #2: Pt will identify 3/5 objects in L visual field with multimodal cues  Plan Discharge plan remains appropriate    Co-evaluation    PT/OT/SLP Co-Evaluation/Treatment: Yes Reason for Co-Treatment: Complexity of the patient's impairments (multi-system involvement);For patient/therapist safety;To address functional/ADL transfers PT goals addressed during session: Mobility/safety with mobility;Balance OT goals addressed during session: ADL's and self-care       AM-PAC OT "6 Clicks" Daily Activity     Outcome Measure   Help from another person eating meals?: Total Help from another person taking care of personal grooming?: A Lot Help from another person toileting, which includes using toliet, bedpan, or urinal?: Total Help from another person bathing (including washing, rinsing, drying)?: Total Help from another person to put on and taking off regular upper body clothing?: Total Help from another person to put on and taking off regular lower body clothing?: Total 6 Click Score: 7    End of Session    OT Visit Diagnosis: Unsteadiness on feet (R26.81);Other abnormalities of gait and mobility (R26.89);Muscle weakness (generalized) (M62.81);Low vision, both eyes (H54.2);Other symptoms and signs involving cognitive function;Hemiplegia and hemiparesis Hemiplegia - Right/Left: Left Hemiplegia - dominant/non-dominant: Non-Dominant Hemiplegia - caused by: Cerebral infarction   Activity Tolerance Patient tolerated treatment well   Patient Left in chair;with call bell/phone within reach  Nurse Communication Mobility status;Need for lift equipment        Time: 0820-0850 OT Time Calculation (min): 30 min  Charges: OT General Charges $OT Visit: 1 Visit OT Treatments $Self Care/Home Management : 8-22 mins  Lou Cal, OT Acute Rehabilitation Services Pager [phone] Office [phone]    Raymondo Band 06/16/2020, 12:13 PM

## 2020-06-16 NOTE — Progress Notes (Signed)
Physical Therapy Treatment Patient Details Name: Joshua Robinson MRN: [id number] DOB: 28-Jul-1934 Today's Date: 06/16/2020    History of Present Illness 86 year old male with no documented chronic medical problems, but is a reformed smoker of 50 pack years presenting with myalgias, coughing, and shortness of breath that began on 05/24/2020.  The patient states that he visited his PCP on a virtual visit on 05/27/2019.  He was given oral steroids and azithromycin.  His symptoms did not improve.  He continued to have worsening shortness of breath, coughing.  He did have some intermittent chest discomfort with coughing but denied any hemoptysis.  He had some loose stools but did not have any hematochezia or melena.  He also had subjective fevers and chills at home.  He has had 2 doses of the Moderna vaccination but has not been boosted.  EMS was activated and the patient was noted to have oxygen saturation 67% on room air.  He was placed on CPAP and brought to the hospital.  In the emergency department, the patient was subsequently placed on high flow nasal cannula.  He did not tolerate it well and was placed on BiPAP.  He has been subsequently transitioned to heated high flow at 40 L.  The patient was given Actemra and started on intravenous steroids.  In addition, the patient was noted to have atrial fibrillation with RVR.  He was started on a diltiazem drip. 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 Pt also with small BM upon initiation of mobility. Rolling R/L for pericare, gown change, and lift pad placement. Total assist rolling R and max assist rolling R. Maximove for transfer bed to recliner. Pt positioned in recliner with feet elevated. Pillows used to elevate LUE and to maintain trunk midline.    Follow Up Recommendations  SNF;LTACH     Equipment Recommendations  Other (comment) (defer to  post acute)    Recommendations for Other Services       Precautions / Restrictions Precautions Precautions: Fall;Other (comment) Precaution Comments: Cortrak, fragile skin, L hemi    Mobility  Bed Mobility Overal bed mobility: Needs Assistance Bed Mobility: Rolling Rolling: Total assist;Max assist         General bed mobility comments: max assist rolling L, total assist rolling R, +rail  Transfers Overall transfer level: Needs assistance               General transfer comment: +2 total assist bed to recliner with maximove  Ambulation/Gait             General Gait Details: nonambulatory   Stairs             Wheelchair Mobility    Modified Rankin (Stroke Patients Only) Modified Rankin (Stroke Patients Only) Pre-Morbid Rankin Score: No symptoms Modified Rankin: Severe disability     Balance                                            Cognition Arousal/Alertness: Awake/alert Behavior During Therapy: Flat affect Overall Cognitive Status: Impaired/Different from baseline Area of Impairment: Orientation;Attention;Following commands;Awareness;Problem solving;Safety/judgement                 Orientation Level: Disoriented to;Time Current Attention Level: Sustained   Following Commands: Follows  one step commands consistently Safety/Judgement: Decreased awareness of safety;Decreased awareness of deficits Awareness: Emergent Problem Solving: Slow processing;Decreased initiation;Difficulty sequencing;Requires verbal cues;Requires tactile cues General Comments: L neglect      Exercises      General Comments General comments (skin integrity, edema, etc.): VSS on RA      Pertinent Vitals/Pain Pain Assessment: No/denies pain    Home Living                      Prior Function            PT Goals (current goals can now be found in the care plan section) Acute Rehab PT Goals Patient Stated Goal: not  stated Progress towards PT goals: Progressing toward goals    Frequency    Min 3X/week      PT Plan Current plan remains appropriate    Co-evaluation PT/OT/SLP Co-Evaluation/Treatment: Yes Reason for Co-Treatment: Complexity of the patient's impairments (multi-system involvement);For patient/therapist safety;To address functional/ADL transfers PT goals addressed during session: Mobility/safety with mobility;Balance        AM-PAC PT "6 Clicks" Mobility   Outcome Measure  Help needed turning from your back to your side while in a flat bed without using bedrails?: Total Help needed moving from lying on your back to sitting on the side of a flat bed without using bedrails?: Total Help needed moving to and from a bed to a chair (including a wheelchair)?: Total Help needed standing up from a chair using your arms (e.g., wheelchair or bedside chair)?: Total Help needed to walk in hospital room?: Total Help needed climbing 3-5 steps with a railing? : Total 6 Click Score: 6    End of Session   Activity Tolerance: Patient tolerated treatment well Patient left: in chair;with call bell/phone within reach Nurse Communication: Mobility status;Need for lift equipment PT Visit Diagnosis: Muscle weakness (generalized) (M62.81);Hemiplegia and hemiparesis;Other abnormalities of gait and mobility (R26.89) Hemiplegia - Right/Left: Left Hemiplegia - caused by: Cerebral infarction     Time: 7124-5809 PT Time Calculation (min) (ACUTE ONLY): 33 min  Charges:  $Therapeutic Activity: 8-22 mins                     Lorrin Goodell, PT  Office # [phone] Pager [phone]    Lorriane Shire 06/16/2020, 11:24 AM

## 2020-06-17 ENCOUNTER — Encounter (HOSPITAL_COMMUNITY): Payer: Self-pay | Admitting: Certified Registered"

## 2020-06-17 LAB — CBC
HCT: 39.7 % (ref 39.0–52.0)
Hemoglobin: 13.3 g/dL (ref 13.0–17.0)
MCH: 32.8 pg (ref 26.0–34.0)
MCHC: 33.5 g/dL (ref 30.0–36.0)
MCV: 98 fL (ref 80.0–100.0)
Platelets: 121 10*3/uL — ABNORMAL LOW (ref 150–400)
RBC: 4.05 MIL/uL — ABNORMAL LOW (ref 4.22–5.81)
RDW: 14.5 % (ref 11.5–15.5)
WBC: 23.1 10*3/uL — ABNORMAL HIGH (ref 4.0–10.5)
nRBC: 0 % (ref 0.0–0.2)

## 2020-06-17 LAB — COMPREHENSIVE METABOLIC PANEL
ALT: 96 U/L — ABNORMAL HIGH (ref 0–44)
AST: 36 U/L (ref 15–41)
Albumin: 2.5 g/dL — ABNORMAL LOW (ref 3.5–5.0)
Alkaline Phosphatase: 76 U/L (ref 38–126)
Anion gap: 9 (ref 5–15)
BUN: 45 mg/dL — ABNORMAL HIGH (ref 8–23)
CO2: 22 mmol/L (ref 22–32)
Calcium: 8 mg/dL — ABNORMAL LOW (ref 8.9–10.3)
Chloride: 101 mmol/L (ref 98–111)
Creatinine, Ser: 0.79 mg/dL (ref 0.61–1.24)
GFR, Estimated: >60 mL/min (ref >60)
Glucose, Bld: 95 mg/dL (ref 70–99)
Potassium: 4.8 mmol/L (ref 3.5–5.1)
Sodium: 132 mmol/L — ABNORMAL LOW (ref 135–145)
Total Bilirubin: 2.1 mg/dL — ABNORMAL HIGH (ref 0.3–1.2)
Total Protein: 5.1 g/dL — ABNORMAL LOW (ref 6.5–8.1)

## 2020-06-17 LAB — GLUCOSE, CAPILLARY
Glucose-Capillary: 122 mg/dL — ABNORMAL HIGH (ref 70–99)
Glucose-Capillary: 125 mg/dL — ABNORMAL HIGH (ref 70–99)
Glucose-Capillary: 133 mg/dL — ABNORMAL HIGH (ref 70–99)
Glucose-Capillary: 72 mg/dL (ref 70–99)
Glucose-Capillary: 73 mg/dL (ref 70–99)
Glucose-Capillary: 81 mg/dL (ref 70–99)
Glucose-Capillary: 94 mg/dL (ref 70–99)

## 2020-06-17 LAB — VITAMIN B12: Vitamin B-12: 237 pg/mL (ref 180–914)

## 2020-06-17 LAB — FOLATE: Folate: 12.3 ng/mL (ref >5.9)

## 2020-06-17 MED ORDER — CYANOCOBALAMIN 1000 MCG/ML IJ SOLN: 1000.0000 ug | INTRAMUSCULAR | Status: DC

## 2020-06-17 MED ORDER — SODIUM CHLORIDE 0.9 % IV SOLN
INTRAVENOUS | Status: DC
  Administered 2020-06-18: 1000 mL via INTRAVENOUS

## 2020-06-17 MED ORDER — ENOXAPARIN SODIUM 80 MG/0.8ML ~~LOC~~ SOLN
1.0000 mg/kg | Freq: Two times a day (BID) | SUBCUTANEOUS | Status: AC
  Administered 2020-06-17 – 2020-06-18 (×2): 70 mg via SUBCUTANEOUS
  Filled 2020-06-17 (×2): qty 0.8

## 2020-06-17 MED ORDER — ENOXAPARIN SODIUM 80 MG/0.8ML ~~LOC~~ SOLN: 70.0000 mg | Freq: Two times a day (BID) | SUBCUTANEOUS | Status: DC

## 2020-06-17 MED ORDER — CYANOCOBALAMIN 1000 MCG/ML IJ SOLN
1000.0000 ug | Freq: Every day | INTRAMUSCULAR | Status: DC
  Administered 2020-06-17 – 2020-06-18 (×2): 1000 ug via INTRAMUSCULAR
  Filled 2020-06-17 (×2): qty 1

## 2020-06-17 NOTE — Progress Notes (Signed)
Tube feeding was restarted - Osmolite 1.5 CAL @ 50 ml/hr per MD order and stopped IV fluids.

## 2020-06-17 NOTE — TOC Progression Note (Signed)
Transition of Care [facility]) - Progression Note    Patient Details  Name: Joshua Robinson MRN: [id number] Date of Birth: [birth year]  Transition of Care [facility]) CM/SW [place], LCSW Phone Number: 06/17/2020, 4:48 PM  Clinical Narrative:    CSW spoke with patient's spouse. She reported she is still not sure of plan and requests more time to discuss with her family. CSW provided SNF bed offers to her; CSW checking on the two facilities in [place] as well.    Expected Discharge Plan: [place] Barriers to Discharge: Continued Medical Work up  Expected Discharge Plan and Services Expected Discharge Plan: [place] In-house Referral: Clinical Social Work     Living arrangements for the past 2 months: Single Family Home                                       Social Determinants of Health (SDOH) Interventions    Readmission Risk Interventions No flowsheet data found.

## 2020-06-17 NOTE — Progress Notes (Addendum)
PROGRESS NOTE  Joshua Robinson KDT:[id number] DOB: [birth year] DOA: 06/01/2020  PCP: Redmond School, MD  Brief History/Interval Summary:   - 86 year-old male with no documented chronic medical problems, but is a reformed smoker of 50 pack years presenting with myalgias, coughing, and shortness of breath that began on 05/24/2020.The patient states that he visited his PCP on a virtual visit on 05/27/2019. He was given oral steroids and azithromycin. His symptoms did not improve. He continued to have worsening shortness of breath, coughing. He has had 2 doses of the Moderna vaccination but has not been boosted.  Found to be positive for COVID-19. In the emergency department, the patient was subsequently placed on high flow nasal cannula. He did not tolerate it well and was placed on BiPAP. He has been subsequently transitioned to heated high flow.  Hospitalized to the intensive care unit and independent.  Respiratory status started improving however patient then developed embolic stroke.  Subsequently transferred to [facility]  Reason for Visit: Pneumonia due to COVID-19.  Acute respiratory failure with hypoxia.  Acute embolic stroke.  Consultants: Pulmonology.  Neurology, IR,  GI  Procedures: None yet  Antibiotics: Anti-infectives (From admission, onward)   Start     Dose/Rate Route Frequency Ordered Stop   06/12/20 1600  fluconazole (DIFLUCAN) tablet 100 mg  Status:  Discontinued        100 mg Per Tube Daily 06/12/20 1507 06/13/20 1329   06/02/20 1000  cefTRIAXone (ROCEPHIN) 2 g in sodium chloride 0.9 % 100 mL IVPB  Status:  Discontinued        2 g 200 mL/hr over 30 Minutes Intravenous Every 24 hours 06/02/20 0638 06/04/20 1004   06/02/20 1000  azithromycin (ZITHROMAX) 500 mg in sodium chloride 0.9 % 250 mL IVPB  Status:  Discontinued        500 mg 250 mL/hr over 60 Minutes Intravenous Every 24 hours 06/02/20 0638 06/04/20 1004   06/02/20 0600  vancomycin (VANCOREADY)  IVPB 750 mg/150 mL  Status:  Discontinued        750 mg 150 mL/hr over 60 Minutes Intravenous Every 12 hours 06/01/20 1333 06/02/20 1136   06/01/20 1245  vancomycin (VANCOREADY) IVPB 1500 mg/300 mL        1,500 mg 150 mL/hr over 120 Minutes Intravenous  Once 06/01/20 1239 06/01/20 1627   06/01/20 1230  cefTRIAXone (ROCEPHIN) 1 g in sodium chloride 0.9 % 100 mL IVPB        1 g 200 mL/hr over 30 Minutes Intravenous  Once 06/01/20 1223 06/01/20 1337   06/01/20 1230  azithromycin (ZITHROMAX) 500 mg in sodium chloride 0.9 % 250 mL IVPB        500 mg 250 mL/hr over 60 Minutes Intravenous  Once 06/01/20 1223 06/01/20 1425      Subjective/Interval History:  No significant events as discussed with staff, patient himself denies any complaints today.    Assessment/Plan:     Recent Labs  Lab 06/13/20 0045 06/14/20 0411 06/15/20 0553 06/16/20 0247 06/17/20 0817  ALT 234* 193* 164* 147* 96*  PROCALCITON  --   --  <0.10  --   --    Acute Hypoxic Resp. Failure/Pneumonia due to COVID-19 Antibacterials: Antibiotics were given in the ED but not continued.  Procalcitonin was 0.19 Remdesivir: It appears that he did not receive Remdesivir Steroids: Treated with IV Solu-Medrol, currently tapered off. Diuretics: Not on scheduled diuretics Inhaled Steroids: None noted Actemra/Baricitinib: Patient was given Actemra PUD  Prophylaxis: On Protonix DVT Prophylaxis: On therapeutic Lovenox - Tested positive on 1/16 -Respiratory failure has resolved, he is currently on room air. -  underwent CT angiogram on 1/17 which was negative for PE.  No need for antibacterials currently.  -Tapered off steroids  Acute embolic right MCA stroke/acute metabolic encephalopathy -While he was at [facility] patient was found to have an acute stroke.  Patient was transferred to [facility].   - MRI showed right MCA stroke with tiny hemorrhagic transformation. -Carotid Doppler significant for minor  carotid arthrosclerosis -The echo with EF 55 to 60% -EEG with no seizures -Encephalopathy has significantly improved, most likely in the setting of his acute CVA -Recommendation for SNF placement -On full anticoagulation for A. fib -remains with significant dysphagia, need PEG tube, general surgery to perform this afternoon, full dose anticoagulation Lovenox ,to resume  this evening after procedure.  Atrial fibrillation with RVR - Initially patient was requiring diltiazem infusion.   -On Lovenox full dose, on hold for PEG tube placement, to resume this evening after procedure  Thrombocytopenia -Most likely in the setting of Covid, and acute illness, as well possibly related Diflucan. -HIT is negative.  Macrocytocis -Order line below, will start on iron supplements , will need to be discharged on B12 supplement, . -Normal limit   Oropharyngeal dysphagia - on core track tube feed, will need PEG, general surgery will evaluate tomorrow -He was started on second full liquid yesterday, but he has significant coughing while being fed, so he is back to n.p.o.  Abnormal LFTs - Patient was found to have transaminitis as well as hyperbilirubinemia.   -  Right upper quadrant ultrasound does show gallstones and gallbladder sludge.  No evidence for acute cholecystitis.  -  Hepatic steatosis noted.  No biliary ductal dilatation.  -Trending down -Negative hepatitis panel -Hold statins  Hypernatremia -Resolved  Mildly elevated troponin Thought to be secondary to demand ischemia from tachyarrhythmia.  Echocardiogram showed EF to be 50 to 55% without any wall motion abnormality.  Do not anticipate any further inpatient cardiac work-up at this time.  Hyperglycemia HbA1c 5.2.  Likely due to steroids.  Seems to be stable.  Continue sliding scale coverage.  Presumptive COPD Quit tobacco about 20 years ago.  Has a 50-pack-year history of smoking.  Continue inhalers.  Oral thrush -resolved with   Magic mouthwash .  Goals of care Palliative care is following.  Patient's family currently desires full scope of care.  Prognosis however remains guarded to poor, have discussed with daughter-in-law , she wants to proceed with DNR, continue with current measures of treatment, if patient is deteriorating, or terminally ill, then they would like to proceed with comfort measures, otherwise for now continue with full scope of treatment but he will be DNR.  Addendum: -Discussed with general surgery, PEG has been postponed till tomorrow, so will receive Lovenox today, will hold tomorrow morning, resume his tube feed currently and to hold it at midnight as well.  DVT Prophylaxis: Heparin GTT>>Arixtra>> lovenox Code Status: Patient is DNR . Family Communication: Daughter-in-law [name], by phone daily  [phone] Disposition Plan: Unclear for now.  Will likely need to go to skilled nursing facility  Status is: Inpatient  Remains inpatient appropriate because:Altered mental status, IV treatments appropriate due to intensity of illness or inability to take PO and Inpatient level of care appropriate due to severity of illness   Dispo: The patient is from: Home  Anticipated d/c is to: To be determined              Anticipated d/c date is: 2 days              Patient currently is not medically stable to d/c. patient will need PEG tube inserted before discharge to facility   Difficult to place patient Yes     Medications:  Scheduled: . Chlorhexidine Gluconate Cloth  6 each Topical Daily  . enoxaparin (LOVENOX) injection  70 mg Subcutaneous Q12H  . feeding supplement (PROSource TF)  45 mL Per Tube BID  . folic acid  1 mg Intravenous Daily  . insulin aspart  0-9 Units Subcutaneous Q4H  . magic mouthwash  10 mL Oral QID  . mouth rinse  15 mL Mouth Rinse BID  . metoprolol tartrate  2.5 mg Intravenous Q8H  . pantoprazole (PROTONIX) IV  40 mg Intravenous QHS  . sodium chloride flush  3  mL Intravenous Q12H  . sodium chloride flush  3 mL Intravenous Q12H  . thiamine injection  100 mg Intravenous Daily   Continuous: . sodium chloride    . sodium chloride 1,000 mL (06/17/20 1056)  . feeding supplement (OSMOLITE 1.5 CAL) Stopped (06/17/20 0022)   [id number] chloride, acetaminophen **OR** acetaminophen, bisacodyl, chlorpheniramine-HYDROcodone, guaiFENesin-dextromethorphan, ondansetron **OR** ondansetron (ZOFRAN) IV, polyethylene glycol, polyvinyl alcohol, Resource ThickenUp Clear, sodium chloride flush   Objective:  Vital Signs  Vitals:   06/16/20 2019 06/17/20 0016 06/17/20 0400 06/17/20 0752  BP:  (!) 105/57 (!) 102/54 (!) 101/58  Pulse:  96 95 96  Resp:  20 18 18   Temp:  98.1 F (36.7 C) 98 F (36.7 C) (!) 97.4 F (36.3 C)  TempSrc:  Axillary Oral Axillary  SpO2: 95% 96% 92% 93%  Weight:      Height:        Intake/Output Summary (Last 24 hours) at 06/17/2020 1115 Last data filed at 06/17/2020 1056 Gross per 24 hour  Intake 775.12 ml  Output -  Net 775.12 ml   Filed Weights   06/11/20 2000 06/12/20 0235 06/13/20 0500  Weight: 69.9 kg 69.9 kg 69.8 kg   Physical exam:  Awake , coherent, appropriate follow commands .frail ,  Left side hemiparesis Symmetrical Chest wall movement, Good air movement bilaterally, CTAB RRR,No Gallops,Rubs or new Murmurs, No Parasternal Heave +ve B.Sounds, Abd Soft, No tenderness, No rebound - guarding or rigidity. No Cyanosis, Clubbing or edema, No new Rash or bruise     Lab Results:  Data Reviewed: I have personally reviewed following labs and imaging studies  CBC: Recent Labs  Lab 06/13/20 0045 06/14/20 0411 06/15/20 0553 06/16/20 0247 06/17/20 0817  WBC 12.7* 16.2* 18.1* 15.3* 23.1*  HGB 14.9 15.3 15.0 15.0 13.3  HCT 44.2 43.4 44.6 47.6 39.7  MCV 96.1 95.6 97.4 106.5* 98.0  PLT 110* 142* 162 135* 121*    Basic Metabolic Panel: Recent Labs  Lab 06/13/20 0045 06/14/20 0411 06/15/20 0553 06/16/20 0247  06/17/20 0817  NA 141 138 134* 134* 132*  K 4.6 4.8 4.7 4.9 4.8  CL 108 105 102 101 101  CO2 23 25 23 23 22   GLUCOSE 90 115* 138* 96 95  BUN 43* 33* 34* 37* 45*  CREATININE 0.70 0.71 0.67 0.75 0.79  CALCIUM 7.7* 8.0* 7.9* 8.1* 8.0*    GFR: Estimated Creatinine Clearance: 67.7 mL/min (by C-G formula based on SCr of 0.79 mg/dL).  Liver Function Tests: Recent Labs  Lab 06/13/20 0045  06/14/20 0411 06/15/20 0553 06/16/20 0247 06/17/20 0817  AST 46* 44* 44* 41 36  ALT 234* 193* 164* 147* 96*  ALKPHOS 67 75 77 80 76  BILITOT 2.2* 1.9* 1.7* 1.7* 2.1*  PROT 5.0* 5.0* 5.0* 5.3* 5.1*  ALBUMIN 2.5* 2.5* 2.5* 2.6* 2.5*     Coagulation Profile: No results for input(s): INR, PROTIME in the last 168 hours.   CBG: Recent Labs  Lab 06/16/20 1703 06/16/20 1930 06/17/20 0020 06/17/20 0410 06/17/20 0749  GLUCAP 130* 109* 122* 72 81     No results found for this or any previous visit (from the past 240 hour(s)).    Radiology Studies: No results found.     LOS: 65 days   Phillips Climes MD  [facility] Pager on [email or web address]  06/17/2020, 11:15 AM

## 2020-06-17 NOTE — Progress Notes (Signed)
SLP Cancellation Note  Patient Details Name: Joshua Robinson MRN: [id number] DOB: 06-08-1934   Cancelled treatment:       Reason Eval/Treat Not Completed: Patient at procedure or test/unavailable. Pt for PEG tube placement today   [name], Katherene Ponto 06/17/2020, 7:50 AM

## 2020-06-17 NOTE — Progress Notes (Signed)
Progress Note     Subjective: Patient agreeable to proceed with PEG placement today. Consent signed and in chart. He has no further questions regarding procedure today. TF have been held since MN.   Objective: Vital signs in last 24 hours: Temp:  [97.4 F (36.3 C)-98.8 F (37.1 C)] 97.4 F (36.3 C) (02/01 0752) Pulse Rate:  [95-102] 96 (02/01 0752) Resp:  [17-20] 18 (02/01 0752) BP: (98-112)/(54-64) 101/58 (02/01 0752) SpO2:  [92 %-98 %] 93 % (02/01 0752) Last BM Date: 06/17/20  Intake/Output from previous day: 01/31 0701 - 02/01 0700 In: 495.5 [I.V.:295.5; NG/GT:200] Out: -  Intake/Output this shift: No intake/output data recorded.  PE: Constitutional: NAD; resting in bed; facial droop, no deformites Eyes: Moist conjunctiva; no lid lag; anicteric; PERRL Neck: Trachea midline; no thyromegaly Lungs: Normal respiratory effort; no tactile fremitus CV: RRR; no palpable thrills; no pitting edema GI: Abd soft, non-tender, +BS, no palpable hepatosplenomegaly, no previous surgical scars MSK: L hemiparesis, no clubbing/cyanosis Psychiatric: Appropriate affect; alert and oriented x3 Lymphatic: No palpable cervical or axillary lymphadenopathy   Lab Results:  Recent Labs    06/16/20 0247 06/17/20 0817  WBC 15.3* 23.1*  HGB 15.0 13.3  HCT 47.6 39.7  PLT 135* 121*   BMET Recent Labs    06/16/20 0247 06/17/20 0817  NA 134* 132*  K 4.9 4.8  CL 101 101  CO2 23 22  GLUCOSE 96 95  BUN 37* 45*  CREATININE 0.75 0.79  CALCIUM 8.1* 8.0*   PT/INR No results for input(s): LABPROT, INR in the last 72 hours. CMP     Component Value Date/Time   NA 132 (L) 06/17/2020 0817   K 4.8 06/17/2020 0817   CL 101 06/17/2020 0817   CO2 22 06/17/2020 0817   GLUCOSE 95 06/17/2020 0817   BUN 45 (H) 06/17/2020 0817   CREATININE 0.79 06/17/2020 0817   CALCIUM 8.0 (L) 06/17/2020 0817   PROT 5.1 (L) 06/17/2020 0817   ALBUMIN 2.5 (L) 06/17/2020 0817   AST 36 06/17/2020 0817   ALT 96  (H) 06/17/2020 0817   ALKPHOS 76 06/17/2020 0817   BILITOT 2.1 (H) 06/17/2020 0817   GFRNONAA >60 06/17/2020 0817   GFRAA >90 02/21/2013 0400   Lipase  No results found for: LIPASE     Studies/Results: No results found.  Anti-infectives: Anti-infectives (From admission, onward)   Start     Dose/Rate Route Frequency Ordered Stop   06/12/20 1600  fluconazole (DIFLUCAN) tablet 100 mg  Status:  Discontinued        100 mg Per Tube Daily 06/12/20 1507 06/13/20 1329   06/02/20 1000  cefTRIAXone (ROCEPHIN) 2 g in sodium chloride 0.9 % 100 mL IVPB  Status:  Discontinued        2 g 200 mL/hr over 30 Minutes Intravenous Every 24 hours 06/02/20 0638 06/04/20 1004   06/02/20 1000  azithromycin (ZITHROMAX) 500 mg in sodium chloride 0.9 % 250 mL IVPB  Status:  Discontinued        500 mg 250 mL/hr over 60 Minutes Intravenous Every 24 hours 06/02/20 0638 06/04/20 1004   06/02/20 0600  vancomycin (VANCOREADY) IVPB 750 mg/150 mL  Status:  Discontinued        750 mg 150 mL/hr over 60 Minutes Intravenous Every 12 hours 06/01/20 1333 06/02/20 1136   06/01/20 1245  vancomycin (VANCOREADY) IVPB 1500 mg/300 mL        1,500 mg 150 mL/hr over 120 Minutes Intravenous  Once  06/01/20 1239 06/01/20 1627   06/01/20 1230  cefTRIAXone (ROCEPHIN) 1 g in sodium chloride 0.9 % 100 mL IVPB        1 g 200 mL/hr over 30 Minutes Intravenous  Once 06/01/20 1223 06/01/20 1337   06/01/20 1230  azithromycin (ZITHROMAX) 500 mg in sodium chloride 0.9 % 250 mL IVPB        500 mg 250 mL/hr over 60 Minutes Intravenous  Once 06/01/20 1223 06/01/20 1425       Assessment/Plan A.fib with RVR - resolved, on full dose Lovenox (AM dose held for procedure today) Thrombocytopenia  Metabolic encephalopathy  COPD Oral Thrush GOC - palliative following, full scope of care currently, pt made DNR.  Acute Hypoxic Resp. Failure/Pneumonia due to COVID-19 - treated with steroids and Actemra, TRH tapering steroids, CTA 1/17 negative  for PE, O2 requirement improving (currently on room air)  Acute embolic R MCA stroke with resulting L hemiplegia and oropharyngeal dysphagia - CT 1/26 looks like gastric anatomy amenable to PEG tube, no history of abdominal surgery. - PEG tube discussed with patient and he is agreeable. Will also call his family to discuss and to get written consent.  - Plan for PEG today 2/1 around 1400, in Endoscopy   LOS: 16 days    Norm Parcel , [facility] 06/17/2020, 10:39 AM Please see Amion for pager number during day hours 7:00am-4:30pm

## 2020-06-17 NOTE — NC FL2 (Signed)
[place] LEVEL OF CARE SCREENING TOOL     IDENTIFICATION  Patient Name: Joshua Robinson Birthdate: June 23, 1934 Sex: male Admission Date (Current Location): 06/01/2020  [facility] and [place] Number:  Herbalist and Address:  [facility] [place], [place] [address] [place], [place] [address]      Provider Number: [id number]  Attending Physician Name and Address:  Albertine Patricia, MD  Relative Name and Phone Number:  [name], spouse, [phone]    Current Level of Care: Hospital Recommended Level of Care: [place] Prior Approval Number:    Date Approved/Denied:   PASRR Number: [id number]  Discharge Plan: SNF    Current Diagnoses: Patient Active Problem List   Diagnosis Date Noted  . Cerebral embolism with cerebral infarction 06/09/2020  . Atrial fibrillation with RVR ([place]) 06/02/2020  . Acute respiratory disease due to COVID-19 virus 06/01/2020  . Acute respiratory failure with hypoxia Due to Covid PNA 06/01/2020  . Pneumonia due to COVID-19 virus 06/01/2020  . Postoperative anemia due to acute blood loss 03/02/2013  . Hyponatremia 03/02/2013  . OA (osteoarthritis) of knee 02/19/2013    Orientation RESPIRATION BLADDER Height & Weight     Self,Situation,Place  Normal Incontinent,External catheter Weight: 153 lb 14.1 oz (69.8 kg) Height:  5\' 8"  (172.7 cm)  BEHAVIORAL SYMPTOMS/MOOD NEUROLOGICAL BOWEL NUTRITION STATUS      Continent Feeding tube  AMBULATORY STATUS COMMUNICATION OF NEEDS Skin   Extensive Assist Verbally Normal                       Personal Care Assistance Level of Assistance  Bathing,Feeding,Dressing Bathing Assistance: Maximum assistance Feeding assistance: Limited assistance Dressing Assistance: Maximum assistance     Functional Limitations Info             SPECIAL CARE FACTORS FREQUENCY  PT (By licensed PT),OT (By licensed OT)     PT Frequency: 5x/week OT Frequency:  5x/week            Contractures Contractures Info: Not present    Additional Factors Info  Code Status,Allergies,Isolation Precautions,Insulin Sliding Scale Code Status Info: DNR Allergies Info: Bee Venom   Insulin Sliding Scale Info: See DC Summary Isolation Precautions Info: COVID+ 06/01/20     Current Medications (06/17/2020):  This is the current hospital active medication list Current Facility-Administered Medications  Medication Dose Route Frequency Provider Last Rate Last Admin  . 0.9 %  sodium chloride infusion  250 mL Intravenous PRN Reubin Milan, MD      . 0.9 %  sodium chloride infusion   Intravenous Continuous Elgergawy, Silver Huguenin, MD 75 mL/hr at 06/17/20 0418 Infusion Verify at 06/17/20 0418  . acetaminophen (TYLENOL) tablet 650 mg  650 mg Per Tube Q6H PRN Levada Dy, Dwayne A, RPH       Or  . acetaminophen (TYLENOL) suppository 650 mg  650 mg Rectal Q6H PRN Joselyn Glassman A, RPH      . bisacodyl (DULCOLAX) suppository 10 mg  10 mg Rectal Daily PRN Reubin Milan, MD      . Chlorhexidine Gluconate Cloth 2 % PADS 6 each  6 each Topical Daily Reubin Milan, MD   6 each at 06/17/20 [phone]  . chlorpheniramine-HYDROcodone (TUSSIONEX) 10-8 MG/5ML suspension 5 mL  5 mL Per Tube Q12H PRN Pierce, Dwayne A, RPH      . enoxaparin (LOVENOX) injection 70 mg  70 mg Subcutaneous Q12H Elgergawy, Silver Huguenin, MD      .  feeding supplement (OSMOLITE 1.5 CAL) liquid 1,000 mL  1,000 mL Per Tube Continuous Bonnielee Haff, MD   Held at 06/17/20 0022  . feeding supplement (PROSource TF) liquid 45 mL  45 mL Per Tube BID Bonnielee Haff, MD   45 mL at 06/16/20 2243  . folic acid injection 1 mg  1 mg Intravenous Daily Reubin Milan, MD   1 mg at 06/17/20 9604  . guaiFENesin-dextromethorphan (ROBITUSSIN DM) 100-10 MG/5ML syrup 10 mL  10 mL Per Tube Q4H PRN Joselyn Glassman A, RPH      . insulin aspart (novoLOG) injection 0-9 Units  0-9 Units Subcutaneous Q4H Reubin Milan, MD   1  Units at 06/17/20 0026  . magic mouthwash  10 mL Oral QID Elgergawy, Silver Huguenin, MD   10 mL at 06/17/20 0855  . MEDLINE mouth rinse  15 mL Mouth Rinse BID Reubin Milan, MD   15 mL at 06/17/20 0855  . metoprolol tartrate (LOPRESSOR) injection 2.5 mg  2.5 mg Intravenous Skipper Cliche, MD   2.5 mg at 06/17/20 0501  . ondansetron (ZOFRAN) tablet 4 mg  4 mg Per Tube Q6H PRN Levada Dy, Dwayne A, RPH       Or  . ondansetron (ZOFRAN) injection 4 mg  4 mg Intravenous Q6H PRN Joselyn Glassman A, RPH      . pantoprazole (PROTONIX) injection 40 mg  40 mg Intravenous QHS Reubin Milan, MD   40 mg at 06/16/20 2240  . polyethylene glycol (MIRALAX / GLYCOLAX) packet 17 g  17 g Per Tube Daily PRN Levada Dy, Dwayne A, RPH      . polyvinyl alcohol (LIQUIFILM TEARS) 1.4 % ophthalmic solution 1 drop  1 drop Both Eyes Daily PRN Reubin Milan, MD      . Resource ThickenUp Clear   Oral PRN Elgergawy, Silver Huguenin, MD      . sodium chloride flush (NS) 0.9 % injection 3 mL  3 mL Intravenous Q12H Reubin Milan, MD   3 mL at 06/16/20 2244  . sodium chloride flush (NS) 0.9 % injection 3 mL  3 mL Intravenous Q12H Reubin Milan, MD   3 mL at 06/17/20 0856  . sodium chloride flush (NS) 0.9 % injection 3 mL  3 mL Intravenous PRN Reubin Milan, MD   3 mL at 06/02/20 0914  . thiamine (B-1) injection 100 mg  100 mg Intravenous Daily Reubin Milan, MD   100 mg at 06/17/20 5409     Discharge Medications: Please see discharge summary for a list of discharge medications.  Relevant Imaging Results:  Relevant Lab Results:   Additional Information SSn: Visalia Moran, [place]

## 2020-06-18 ENCOUNTER — Encounter (HOSPITAL_COMMUNITY)
Admission: EM | Disposition: A | Discharge status: Hospice | Payer: Self-pay | Source: Home / Self Care | Attending: Internal Medicine

## 2020-06-18 DIAGNOSIS — R1312 Dysphagia, oropharyngeal phase: Secondary | ICD-10-CM

## 2020-06-18 LAB — GLUCOSE, CAPILLARY
Glucose-Capillary: 94 mg/dL (ref 70–99)
Glucose-Capillary: 95 mg/dL (ref 70–99)
Glucose-Capillary: 73 mg/dL (ref 70–99)

## 2020-06-18 LAB — BASIC METABOLIC PANEL
Anion gap: 10 (ref 5–15)
BUN: 40 mg/dL — ABNORMAL HIGH (ref 8–23)
CO2: 21 mmol/L — ABNORMAL LOW (ref 22–32)
Calcium: 7.8 mg/dL — ABNORMAL LOW (ref 8.9–10.3)
Chloride: 104 mmol/L (ref 98–111)
Creatinine, Ser: 0.7 mg/dL (ref 0.61–1.24)
GFR, Estimated: >60 mL/min (ref >60)
Glucose, Bld: 96 mg/dL (ref 70–99)
Potassium: 4.4 mmol/L (ref 3.5–5.1)
Sodium: 135 mmol/L (ref 135–145)

## 2020-06-18 LAB — CBC
HCT: 35.6 % — ABNORMAL LOW (ref 39.0–52.0)
Hemoglobin: 12.2 g/dL — ABNORMAL LOW (ref 13.0–17.0)
MCH: 33.7 pg (ref 26.0–34.0)
MCHC: 34.3 g/dL (ref 30.0–36.0)
MCV: 98.3 fL (ref 80.0–100.0)
Platelets: 119 10*3/uL — ABNORMAL LOW (ref 150–400)
RBC: 3.62 MIL/uL — ABNORMAL LOW (ref 4.22–5.81)
RDW: 14.7 % (ref 11.5–15.5)
WBC: 18.6 10*3/uL — ABNORMAL HIGH (ref 4.0–10.5)
nRBC: 0 % (ref 0.0–0.2)

## 2020-06-18 LAB — SURGICAL PCR SCREEN
MRSA, PCR: NEGATIVE
Staphylococcus aureus: NEGATIVE

## 2020-06-18 SURGERY — EGD (ESOPHAGOGASTRODUODENOSCOPY)
Anesthesia: Monitor Anesthesia Care

## 2020-06-18 MED ORDER — LORAZEPAM 1 MG PO TABS: 1.0000 mg | ORAL_TABLET | ORAL | Status: DC | PRN

## 2020-06-18 MED ORDER — MORPHINE SULFATE (CONCENTRATE) 10 MG/0.5ML PO SOLN
5.0000 mg | ORAL | Status: DC | PRN
  Administered 2020-06-18 – 2020-06-19 (×9): 5 mg via SUBLINGUAL
  Filled 2020-06-18 (×8): qty 0.5

## 2020-06-18 MED ORDER — ONDANSETRON 4 MG PO TBDP: 4.0000 mg | ORAL_TABLET | Freq: Four times a day (QID) | ORAL | Status: DC | PRN

## 2020-06-18 MED ORDER — MORPHINE SULFATE (CONCENTRATE) 10 MG/0.5ML PO SOLN
5.0000 mg | ORAL | Status: DC | PRN
  Filled 2020-06-18: qty 0.5

## 2020-06-18 MED ORDER — METOPROLOL TARTRATE 5 MG/5ML IV SOLN
5.0000 mg | Freq: Four times a day (QID) | INTRAVENOUS | Status: DC
  Administered 2020-06-18 – 2020-06-19 (×4): 5 mg via INTRAVENOUS
  Filled 2020-06-18 (×4): qty 5

## 2020-06-18 MED ORDER — METOPROLOL TARTRATE 5 MG/5ML IV SOLN
5.0000 mg | INTRAVENOUS | Status: DC | PRN
  Administered 2020-06-18: 5 mg via INTRAVENOUS

## 2020-06-18 MED ORDER — HALOPERIDOL LACTATE 2 MG/ML PO CONC
0.5000 mg | ORAL | Status: DC | PRN
  Filled 2020-06-18: qty 0.3

## 2020-06-18 MED ORDER — LORAZEPAM 2 MG/ML PO CONC
1.0000 mg | ORAL | Status: DC | PRN
  Administered 2020-06-19 (×3): 1 mg via SUBLINGUAL
  Filled 2020-06-18 (×4): qty 1

## 2020-06-18 MED ORDER — DIPHENHYDRAMINE HCL 50 MG/ML IJ SOLN: 12.5000 mg | INTRAMUSCULAR | Status: DC | PRN

## 2020-06-18 MED ORDER — ATROPINE SULFATE 1 % OP SOLN
4.0000 [drp] | OPHTHALMIC | Status: DC | PRN
  Filled 2020-06-18: qty 2

## 2020-06-18 MED ORDER — HALOPERIDOL LACTATE 5 MG/ML IJ SOLN: 0.5000 mg | INTRAMUSCULAR | Status: DC | PRN

## 2020-06-18 MED ORDER — POLYVINYL ALCOHOL 1.4 % OP SOLN: 1.0000 [drp] | Freq: Four times a day (QID) | OPHTHALMIC | Status: DC | PRN

## 2020-06-18 MED ORDER — ONDANSETRON HCL 4 MG/2ML IJ SOLN: 4.0000 mg | Freq: Four times a day (QID) | INTRAMUSCULAR | Status: DC | PRN

## 2020-06-18 MED ORDER — HALOPERIDOL 0.5 MG PO TABS
0.5000 mg | ORAL_TABLET | ORAL | Status: DC | PRN
  Filled 2020-06-18: qty 1

## 2020-06-18 MED ORDER — LORAZEPAM 2 MG/ML IJ SOLN
1.0000 mg | INTRAMUSCULAR | Status: DC | PRN
  Administered 2020-06-18 – 2020-06-19 (×3): 1 mg via INTRAVENOUS
  Filled 2020-06-18 (×3): qty 1

## 2020-06-18 NOTE — Progress Notes (Signed)
PROGRESS NOTE                                                                                                                                                                                                             Patient Demographics:    Joshua Robinson, is a 86 y.o. male, DOB - 09-20-1934, XB:[id number]  Outpatient Primary MD for the patient is Redmond School, MD   Admit date - 06/01/2020   LOS - 64  Chief Complaint  Patient presents with  . Shortness of Breath       Brief Narrative: Patient is a 86 y.o. male with no PMHx of with worsening shortness of breath, coughing-subsequently found to be COVID-19 positive-evaluated in the emergency room-found to have severe hypoxemia due to COVID-19 pneumonia-Hospital course unfortunately complicated by acute CVA-subsequently transferred to [facility].  Patient gradually improved but continued to have significant left-sided hemiplegia and dysphagia with plans to perform a PEG tube placement and subsequent discharge to SNF.  See below for further details.   COVID-19 vaccinated status: Vaccinated but not boosted.  Significant studies: 1/16>> CTA chest: No PE, diffuse multifocal pneumonia 1/17>> Echo: EF 50-55% 1/22>> CTA neck: Acute right M2 branch occlusion 1/22>> carotid Dopplers: Mild carotid atherosclerosis, no hemodynamically significant ICA stenosis. 1/23>> CT head: Stable size of the right MCA infarct 1/24>> MRI brain: Acute right MCA territory infarct with local mass-effect, mild amount of hemorrhage in the infarct. 1/24>> RUQ ultrasound: Gallbladder sludge/gallstone-no signs of acute cholecystitis, no evidence of biliary dilatation 1/26>> CT abdomen without contrast: Cholelithiasis, diverticulosis, aneurysmal dilatation of infrarenal abdominal aorta   COVID-19 medications: Steroids: 1/16>> 1/26 Actemra: 1/16 x 1  Antibiotics: Rocephin: 1/16>> 1/18 Zithromax: 1/16>>  1/18  Microbiology data: 1/16>> blood culture: No growth  Procedures: None  Consults: Neurology, surgery, IR  DVT prophylaxis: Place and maintain sequential compression device Start: 06/09/20 1551 SCDs Start: 06/01/20 1416 Place TED hose Start: 06/01/20 1416    Subjective:    Joshua Robinson today remains unchanged-accumulating secretions at times-required up to 5 L of oxygen-after nasotracheal suctioning-much improved and comfortable.   Assessment  & Plan :   Acute Hypoxic Resp Failure due to Covid 19 Viral pneumonia-now lately with aspiration pneumonia: Had improved overall-not room air-however this morning-gurgling-accumulating secretions and actively aspirating-up to 5 L of oxygen.  Required nasotracheal suctioning.  After discussion  with family-transitioning to comfort measures-see below.    Fever: afebrile O2 requirements:  SpO2: 90 % O2 Flow Rate (L/min): 5 L/min FiO2 (%): 100 %   COVID-19 Labs: No results for input(s): DDIMER, FERRITIN, LDH, CRP in the last 72 hours.     Component Value Date/Time   BNP 434.0 (H) 06/01/2020 1217    Recent Labs  Lab 06/15/20 0553  PROCALCITON <0.10    Lab Results  Component Value Date   SARSCOV2NAA Not Detected 04/24/2019    Possible aspiration pneumonitis: Significant worsening this morning-on 5 L of oxygen-has leukocytosis but downtrending-after discussion with daughter-in-law Joshua Robinson who is a Therapist, sports over the phone and then with spouse and son-given poor prognosis-high likelihood of continued aspiration-has been transitioned to full comfort measures.  Family wants to take him home with hospice.  A. fib with RVR: Heart rate in the 140s this morning-increase IV Lopressor to 5 mg IV every 6-for comfort.  Anticoagulation held in anticipation of active placement-we will not plan on resuming on discharge as patient full comfort measures and actively aspirating  Acute embolic right MCA CVA: Remains with significant right-sided  hemiplegia and dysphagia-work-up as above-see below-plans to transition to comfort measures.  Dysphagia: Due to above-PEG tube placement been planned for 2/2-however due to acute worsening-overt aspiration-after discussion with family-this has been canceled.  Okay for comfort feeds.  Macrocytosis/vitamin B deficiency: Was on vitamin B12 supplementation-no plans to continue on discharge.   Thrombocytopenia: Mild-improving-HIT antibody negative.  Abdominal aortic aneurysm: Incidental finding-radiology recommends aortic ultrasound in 3 years  Palliative care: DNR in place-had severe Covid related hypoxia and embolic CVA-with severe dysphagia and left hemiplegia-was gradually improving and otherwise stable-on 2/2-significantly hypoxic-overtly aspirating.  Extensive discussion with multiple family members-including daughter-in-law Joshua Robinson (RN at [employer]) son and spouse over the phone-explained poor prognosis-and that feeding tube will not change aspiration.  After extensive discussion-family is ready to transition to full comfort measures-and would prefer him coming home with hospice care.  Social work consulted.  Planning to start full comfort care measures in the interim.  Nutrition Problem: Nutrition Problem: Inadequate oral intake Etiology: acute illness (acute respiratory failure with hypoxia due to Covid PNA) Signs/Symptoms: NPO status Interventions: Tube feeding  Obesity: Estimated body mass index is 23.4 kg/m as calculated from the following:   Height as of this encounter: 5\' 8"  (1.727 m).   Weight as of this encounter: 69.8 kg.   RN pressure injury documentation: Pressure Injury 06/17/20 Buttocks Right Deep Tissue Pressure Injury - Purple or maroon localized area of discolored intact skin or blood-filled blister due to damage of underlying soft tissue from pressure and/or shear. (Active)  06/17/20 0744  Location: Buttocks  Location Orientation: Right  Staging: Deep Tissue Pressure  Injury - Purple or maroon localized area of discolored intact skin or blood-filled blister due to damage of underlying soft tissue from pressure and/or shear.  Wound Description (Comments):   Present on Admission:      Pressure Injury 06/17/20 Buttocks Left Deep Tissue Pressure Injury - Purple or maroon localized area of discolored intact skin or blood-filled blister due to damage of underlying soft tissue from pressure and/or shear. (Active)  06/17/20 0745  Location: Buttocks  Location Orientation: Left  Staging: Deep Tissue Pressure Injury - Purple or maroon localized area of discolored intact skin or blood-filled blister due to damage of underlying soft tissue from pressure and/or shear.  Wound Description (Comments):   Present on Admission:     GI prophylaxis: PPI  ABG:  Component Value Date/Time   PHART 7.493 (H) 06/09/2020 1315   PCO2ART 32.4 06/09/2020 1315   PO2ART 51.1 (L) 06/09/2020 1315   HCO3 24.7 06/09/2020 1315   ACIDBASEDEF 1.2 06/01/2020 1330   O2SAT 86.8 06/09/2020 1315    Vent Settings: N/A   Condition - Extremely Guarded-very tenuous with risk for further deterioration=  Family Communication  :  Daughter-in-law Joshua Robinson [phone] on 2/2-followed by spouse/son [phone].   Code Status :  DNR  Diet :  Diet Order            Diet NPO time specified  Diet effective now                  Disposition Plan  :   Status is: Inpatient  Remains inpatient appropriate because:Inpatient level of care appropriate due to severity of illness   Dispo: The patient is from: Home              Anticipated d/c is to: Home w Hospice              Anticipated d/c date is: 1 day              Patient currently is not medically stable to d/c.   Difficult to place patient No   Barriers to discharge: Transitioning to comfort measures-awaiting home hospice arrangements.   Antimicorbials  :    Anti-infectives (From admission, onward)   Start     Dose/Rate Route  Frequency Ordered Stop   06/12/20 1600  fluconazole (DIFLUCAN) tablet 100 mg  Status:  Discontinued        100 mg Per Tube Daily 06/12/20 1507 06/13/20 1329   06/02/20 1000  cefTRIAXone (ROCEPHIN) 2 g in sodium chloride 0.9 % 100 mL IVPB  Status:  Discontinued        2 g 200 mL/hr over 30 Minutes Intravenous Every 24 hours 06/02/20 0638 06/04/20 1004   06/02/20 1000  azithromycin (ZITHROMAX) 500 mg in sodium chloride 0.9 % 250 mL IVPB  Status:  Discontinued        500 mg 250 mL/hr over 60 Minutes Intravenous Every 24 hours 06/02/20 0638 06/04/20 1004   06/02/20 0600  vancomycin (VANCOREADY) IVPB 750 mg/150 mL  Status:  Discontinued        750 mg 150 mL/hr over 60 Minutes Intravenous Every 12 hours 06/01/20 1333 06/02/20 1136   06/01/20 1245  vancomycin (VANCOREADY) IVPB 1500 mg/300 mL        1,500 mg 150 mL/hr over 120 Minutes Intravenous  Once 06/01/20 1239 06/01/20 1627   06/01/20 1230  cefTRIAXone (ROCEPHIN) 1 g in sodium chloride 0.9 % 100 mL IVPB        1 g 200 mL/hr over 30 Minutes Intravenous  Once 06/01/20 1223 06/01/20 1337   06/01/20 1230  azithromycin (ZITHROMAX) 500 mg in sodium chloride 0.9 % 250 mL IVPB        500 mg 250 mL/hr over 60 Minutes Intravenous  Once 06/01/20 1223 06/01/20 1425      Inpatient Medications  Scheduled Meds: . Chlorhexidine Gluconate Cloth  6 each Topical Daily  . cyanocobalamin  1,000 mcg Intramuscular Daily   Followed by  . [START ON 07/07/2020] cyanocobalamin  1,000 mcg Intramuscular Weekly  . enoxaparin (LOVENOX) injection  70 mg Subcutaneous Q12H  . feeding supplement (PROSource TF)  45 mL Per Tube BID  . folic acid  1 mg Intravenous Daily  . insulin aspart  0-9 Units Subcutaneous  Q4H  . magic mouthwash  10 mL Oral QID  . mouth rinse  15 mL Mouth Rinse BID  . metoprolol tartrate  5 mg Intravenous Q6H  . pantoprazole (PROTONIX) IV  40 mg Intravenous QHS  . sodium chloride flush  3 mL Intravenous Q12H  . sodium chloride flush  3 mL  Intravenous Q12H  . thiamine injection  100 mg Intravenous Daily   Continuous Infusions: . sodium chloride    . sodium chloride 1,000 mL (06/18/20 0951)  . feeding supplement (OSMOLITE 1.5 CAL) 50 mL/hr at 06/17/20 1222   PRN Meds:.sodium chloride, acetaminophen **OR** acetaminophen, bisacodyl, chlorpheniramine-HYDROcodone, guaiFENesin-dextromethorphan, metoprolol tartrate, ondansetron **OR** ondansetron (ZOFRAN) IV, polyethylene glycol, polyvinyl alcohol, Resource ThickenUp Clear, sodium chloride flush   Time Spent in minutes  45    See all Orders from today for further details   Oren Binet M.D on 06/18/2020 at 11:13 AM  To page go to [email or web address] - use universal password  [facility] -  Office  [phone]    Objective:   Vitals:   06/18/20 0600 06/18/20 0728 06/18/20 0800 06/18/20 0854  BP: (!) 110/49 (!) 107/52 122/62   Pulse: 90 91    Resp: 20 18    Temp:  97.8 F (36.6 C)    TempSrc:  Axillary    SpO2:  92%  90%  Weight:      Height:        Wt Readings from Last 3 Encounters:  06/13/20 69.8 kg  02/19/13 76.7 kg  02/12/13 76.7 kg     Intake/Output Summary (Last 24 hours) at 06/18/2020 1113 Last data filed at 06/18/2020 0656 Gross per 24 hour  Intake 426.82 ml  Output 1950 ml  Net -1523.18 ml     Physical Exam In mild respiratory distress-gurgling Chest: Transmitted upper airway sounds CVS: S1-S2-regular-tachycardic Abdomen: Soft-nontender nondistended Neurology: Left hemiplegia   Data Review:    CBC Recent Labs  Lab 06/14/20 0411 06/15/20 0553 06/16/20 0247 06/17/20 0817 06/18/20 0218  WBC 16.2* 18.1* 15.3* 23.1* 18.6*  HGB 15.3 15.0 15.0 13.3 12.2*  HCT 43.4 44.6 47.6 39.7 35.6*  PLT 142* 162 135* 121* 119*  MCV 95.6 97.4 106.5* 98.0 98.3  MCH 33.7 32.8 33.6 32.8 33.7  MCHC 35.3 33.6 31.5 33.5 34.3  RDW 13.8 13.5 14.3 14.5 14.7    Chemistries  Recent Labs  Lab 06/13/20 0045 06/14/20 0411 06/15/20 0553 06/16/20 0247  06/17/20 0817 06/18/20 0218  NA 141 138 134* 134* 132* 135  K 4.6 4.8 4.7 4.9 4.8 4.4  CL 108 105 102 101 101 104  CO2 23 25 23 23 22  21*  GLUCOSE 90 115* 138* 96 95 96  BUN 43* 33* 34* 37* 45* 40*  CREATININE 0.70 0.71 0.67 0.75 0.79 0.70  CALCIUM 7.7* 8.0* 7.9* 8.1* 8.0* 7.8*  AST 46* 44* 44* 41 36  --   ALT 234* 193* 164* 147* 96*  --   ALKPHOS 67 75 77 80 76  --   BILITOT 2.2* 1.9* 1.7* 1.7* 2.1*  --    ------------------------------------------------------------------------------------------------------------------ No results for input(s): CHOL, HDL, LDLCALC, TRIG, CHOLHDL, LDLDIRECT in the last 72 hours.  Lab Results  Component Value Date   HGBA1C 5.2 06/07/2020   ------------------------------------------------------------------------------------------------------------------ No results for input(s): TSH, T4TOTAL, T3FREE, THYROIDAB in the last 72 hours.  Invalid input(s): FREET3 ------------------------------------------------------------------------------------------------------------------ Recent Labs    06/17/20 0817  VITAMINB12 237  FOLATE 12.3    Coagulation profile No results for input(s): INR, PROTIME  in the last 168 hours.  No results for input(s): DDIMER in the last 72 hours.  Cardiac Enzymes No results for input(s): CKMB, TROPONINI, MYOGLOBIN in the last 168 hours.  Invalid input(s): CK ------------------------------------------------------------------------------------------------------------------    Component Value Date/Time   BNP 434.0 (H) 06/01/2020 1217    Micro Results Recent Results (from the past 240 hour(s))  Surgical PCR screen     Status: None   Collection Time: 06/18/20  9:02 AM   Specimen: Nasal Mucosa; Nasal Swab  Result Value Ref Range Status   MRSA, PCR NEGATIVE NEGATIVE Final   Staphylococcus aureus NEGATIVE NEGATIVE Final    Comment: (NOTE) The Xpert SA Assay (FDA approved for NASAL specimens in patients 102 years of age  and older), is one component of a comprehensive surveillance program. It is not intended to diagnose infection nor to guide or monitor treatment. Performed at [facility], [place] [address] [place], [place] [address]     Radiology Reports EEG  Result Date: 06/09/2020 Lora Havens, MD     06/09/2020  9:06 PM Patient Name: JAYR LUPERCIO MRN: [id number] Epilepsy Attending: Lora Havens Referring Physician/Provider: Dr Lesleigh Noe Date: 06/09/2020 Duration: 26.29 mins Patient history: 86yo M with AMS. EEG to evaluate for seizure Level of alertness: Awake AEDs during EEG study: None Technical aspects: This EEG study was done with scalp electrodes positioned according to the 10-20 International system of electrode placement. Electrical activity was acquired at a sampling rate of 500Hz  and reviewed with a high frequency filter of 70Hz  and a low frequency filter of 1Hz . EEG data were recorded continuously and digitally stored. Description: No posterior dominant rhythm was seen. EEG showed continuous generalized 3 to 6 Hz theta-delta slowing. Hyperventilation and photic stimulation were not performed.   ABNORMALITY -Continuous slow, generalized IMPRESSION: This study is suggestive of moderate diffuse encephalopathy, nonspecific etiology. No seizures or epileptiform discharges were seen throughout the recording. Priyanka Barbra Sarks   CT ABDOMEN WO CONTRAST  Result Date: 06/11/2020 CLINICAL DATA:  Evaluate anatomy prior to gastrostomy tube placement. History of COVID-19 infection. EXAM: CT ABDOMEN WITHOUT CONTRAST TECHNIQUE: Multidetector CT imaging of the abdomen was performed following the standard protocol without IV contrast. COMPARISON:  Chest CT-06/01/2020 FINDINGS: Lower chest: Limited visualization of the lower thorax demonstrates bibasilar ground-glass interstitial opacities with relative subpleural sparing. Subpleural nodular opacities are seen within the imaged lung bases. Overall,  there is improved aeration of lung bases compared to the 06/01/2020 examination. No air bronchograms. No pleural effusion. Normal heart size. Coronary artery calcifications. No pericardial effusion. Hepatobiliary: Normal hepatic contour. High-density material is seen within the gallbladder fossa potentially radiopaque sludge and/or stones. No noncontrast evidence of gallbladder wall thickening or pericholecystic stranding. No ascites. Pancreas: Normal noncontrast appearance of the pancreas. Spleen: Normal noncontrast appearance of the spleen. Adrenals/Urinary Tract: No renal stones. There is a minimal amount of grossly symmetric likely age and body habitus related perinephric stranding. No urine obstruction. Normal noncontrast appearance the bilateral adrenal glands. The urinary bladder was not imaged. Stomach/Bowel: The anterior wall of the mid body of the stomach is well apposed against the ventral wall of the upper abdomen without interposition of either the hepatic parenchyma or transverse colon and the percutaneous window will likely be improved with gastric insufflation. Enteric tube tip terminates within the duodenal bulb. Scattered colonic diverticulosis. The cecum is noted to be located within the right upper abdominal quadrant. Normal noncontrast appearance of the terminal ileum and appendix. No pneumoperitoneum,  pneumatosis or portal venous gas. Vascular/Lymphatic: Aneurysmal dilatation of the infrarenal abdominal aorta measuring approximately 3.0 x 3.0 x 3.0 cm as measured in greatest oblique short axis axial (image 41, series 3), coronal (image 34, series 6) and sagittal (image 60, series 7) dimensions respectively. There is a moderate to large amount of eccentric mixed calcified and noncalcified atherosclerotic plaque throughout the abdominal aorta. Mild ectasia of the right common iliac artery measuring 1.6 cm in diameter. No bulky retroperitoneal or mesenteric lymphadenopathy on this noncontrast  examination. Other: Mild diffuse body wall anasarca. Musculoskeletal: No acute or aggressive osseous abnormalities. Moderate severe multilevel lumbar spine DDD, worse at L1-L2, and a lesser extent, L3-L4 and L5-S1 with disc space height loss, endplate irregularity and sclerosis. Grade 1 anterolisthesis of L3 upon L4 measuring approximately 4 mm. No associated pars defects. IMPRESSION: 1. Gastric anatomy amenable to potential percutaneous gastrostomy tube placement as indicated. 2. Cholelithiasis without evidence of cholecystitis. 3. Colonic diverticulosis without evidence of diverticulitis. 4. Improved aeration of the lung bases compared to the 06/01/2020 examination with persistent bibasilar ground-glass interstitial opacities and subpleural nodular opacities, nonspecific though compatible with provided history of COVID-19 infection. 5. Aneurysmal dilatation of the infrarenal abdominal aorta measuring 3.0 cm in diameter. Recommend follow-up aortic ultrasound in 3 years. This recommendation follows ACR consensus guidelines: White Paper of the ACR Incidental Findings Committee II on Vascular Findings. J Am Coll Radiol 2013; 10:789-794. 6. Coronary calcifications.  Aortic Atherosclerosis (ICD10-I70.0). Electronically Signed   By: Sandi Mariscal M.D.   On: 06/11/2020 16:37   CT HEAD WO CONTRAST  Result Date: 06/08/2020 CLINICAL DATA:  Right MCA infarct EXAM: CT HEAD WITHOUT CONTRAST TECHNIQUE: Contiguous axial images were obtained from the base of the skull through the vertex without intravenous contrast. COMPARISON:  06/06/2020 FINDINGS: Brain: Stable appearance of the right MCA territory infarct seen previously. No significant change in the size of the infarct territory. No evidence of hemorrhagic transformation. No significant mass effect or midline shift. The lateral ventricles and midline structures are unremarkable. No acute extra-axial fluid collections. Vascular: The hyperdense right M2 branch of the MCA seen  previously is less pronounced on this exam. Skull: Normal. Negative for fracture or focal lesion. Sinuses/Orbits: Fluid within the bilateral maxillary sinuses. Mucosal thickening right ethmoid air cells. Other: None. IMPRESSION: 1. Stable size of the right MCA infarct seen previously. No evidence of hemorrhagic transformation. Electronically Signed   By: Randa Ngo M.D.   On: 06/08/2020 23:05   CT ANGIO NECK W OR WO CONTRAST  Result Date: 06/07/2020 CLINICAL DATA:  Follow-up examination for acute stroke. EXAM: CT ANGIOGRAPHY HEAD AND NECK TECHNIQUE: Multidetector CT imaging of the head and neck was performed using the standard protocol during bolus administration of intravenous contrast. Multiplanar CT image reconstructions and MIPs were obtained to evaluate the vascular anatomy. Carotid stenosis measurements (when applicable) are obtained utilizing NASCET criteria, using the distal internal carotid diameter as the denominator. CONTRAST:  149mL OMNIPAQUE IOHEXOL 350 MG/ML SOLN COMPARISON:  Prior head CT from 06/06/2020. FINDINGS: Delete that CTA NECK FINDINGS Aortic arch: Visualized aortic arch of normal caliber with normal 3 vessel morphology. Moderate atheromatous change about the arch and origin of the great vessels without hemodynamically significant stenosis. Right carotid system: Right common carotid artery patent from its origin to the bifurcation without stenosis. Scattered calcified plaque about the right bifurcation without hemodynamically significant stenosis. Right ICA patent distally to the skull base without stenosis, dissection or occlusion. Left carotid system: Left common and  internal carotid arteries patent without stenosis, dissection or occlusion. No significant atheromatous change or irregularity about the left bifurcation. Vertebral arteries: Both vertebral arteries arise from subclavian arteries. No proximal subclavian artery stenosis. Both vertebral arteries widely patent within the  neck without stenosis, dissection or occlusion. Skeleton: No visible acute osseous abnormality. No discrete or worrisome osseous lesions. Moderate cervical spondylosis noted at C4-5 and C5-6. Other neck: No other acute soft tissue abnormality within the neck. No mass or adenopathy. Upper chest: Extensive multifocal ground-glass opacity seen throughout the visualized lungs, right worse than left, concerning for severe multifocal pneumonia. Review of the MIP images confirms the above findings CTA HEAD FINDINGS Anterior circulation: Petrous segments patent bilaterally. Mild atheromatous change within the carotid siphons without hemodynamically significant stenosis. A1 segments widely patent. Normal anterior communicating artery complex. Anterior cerebral arteries patent to their distal aspects without stenosis. No M1 stenosis or occlusion. Normal MCA bifurcations. On the right, there is acute occlusion of a proximal right M2 branch, superior division (series 7, image 88). Inferior division and its branches remain patent. Contralateral left MCA branches well perfused and patent. Posterior circulation: Both V4 segments patent to the vertebrobasilar junction without stenosis. Right PICA patent and normal. Left PICA not seen. Basilar patent to its distal aspect without stenosis. Superior cerebellar arteries patent bilaterally. Both PCAs primarily supplied via the basilar well perfused or distal aspects. Venous sinuses: Grossly patent allowing for timing the contrast bolus. Hypoplastic left transverse and sigmoid sinuses not well assessed. Anatomic variants: None significant.  No aneurysm. Review of the MIP images confirms the above findings IMPRESSION: 1. Acute right M2 branch occlusion, superior division, in keeping with the previously identified acute to early subacute right MCA distribution infarct. 2. Otherwise wide patency of the major arterial vasculature of the head and neck. 3. Extensive ground-glass opacity  throughout the visualized lungs, right worse than left, concerning for severe multifocal pneumonia. Electronically Signed   By: Jeannine Boga M.D.   On: 06/07/2020 02:03   CT Angio Chest PE W and/or Wo Contrast  Result Date: 06/01/2020 CLINICAL DATA:  cough, COVID+, elevated d dimer EXAM: CT ANGIOGRAPHY CHEST WITH CONTRAST TECHNIQUE: Multidetector CT imaging of the chest was performed using the standard protocol during bolus administration of intravenous contrast. Multiplanar CT image reconstructions and MIPs were obtained to evaluate the vascular anatomy. CONTRAST:  95mL OMNIPAQUE IOHEXOL 350 MG/ML SOLN COMPARISON:  Xr chest 06/01/20, xr chest 02/12/13 FINDINGS: Cardiovascular: Satisfactory opacification of the pulmonary arteries to the segmental level. No evidence of pulmonary embolism. Enlarged right atria. Otherwise heart size. No significant pericardial effusion. The thoracic aorta is normal in caliber. Mild atherosclerotic plaque of the thoracic aorta. Mild 2 vessel coronary artery calcifications. Mediastinum/Nodes: No enlarged mediastinal, hilar, or axillary lymph nodes. Thyroid gland, trachea, and esophagus demonstrate no significant findings. Lungs/Pleura: Diffuse ground glass and consolidative opacities involving the majority of the lungs. Limited evaluation for pulmonary nodule due to minimal aerated lung. No definite pulmonary mass. No pleural effusion. No pneumothorax. Upper Abdomen: No acute abnormality. Musculoskeletal: No chest wall abnormality No suspicious lytic or blastic osseous lesions. No acute displaced fracture. Multilevel degenerative changes of the spine. Review of the MIP images confirms the above findings. IMPRESSION: 1. No pulmonary embolus. 2. Diffuse multifocal pneumonia in the setting of COVID-19 infection. 3.  Aortic Atherosclerosis (ICD10-I70.0). Electronically Signed   By: Iven Finn M.D.   On: 06/01/2020 15:26   MR BRAIN WO CONTRAST  Addendum Date: 06/09/2020    ADDENDUM REPORT:  06/09/2020 15:59 ADDENDUM: These results were called by telephone at the time of interpretation on 06/09/2020 at 3:59 pm to provider [facility] , who verbally acknowledged these results. Electronically Signed   By: Franchot Gallo M.D.   On: 06/09/2020 15:59   Result Date: 06/09/2020 CLINICAL DATA:  Stroke.  COVID positive EXAM: MRI HEAD WITHOUT CONTRAST TECHNIQUE: Multiplanar, multiecho pulse sequences of the brain and surrounding structures were obtained without intravenous contrast. COMPARISON:  CT head 06/08/2020 FINDINGS: Brain: Moderately large right MCA infarct involving the insula, operculum, middle right frontal lobe. Mild amount of hemorrhage in the infarct. Additional small areas of acute infarct in the occipital lobes bilaterally. Ventricle size normal. Generalized atrophy. 3 mm midline shift to the left. Chronic microvascular ischemic changes in the white matter. Negative for mass lesion. Vascular: Normal arterial flow voids Skull and upper cervical spine: Negative Sinuses/Orbits: Mucosal edema and air-fluid levels in the paranasal sinuses. Right cataract extraction Other: None IMPRESSION: Acute infarct right MCA territory with local mass-effect and mild midline shift. Mild amount of hemorrhage in the infarct Small acute infarcts in the occipital lobe bilaterally. Electronically Signed: By: Franchot Gallo M.D. On: 06/09/2020 15:42   US Carotid Bilateral (at [facility] and [facility] only)  Result Date: 06/07/2020 CLINICAL DATA:  Altered mental status, left-sided weakness, right MCA territory infarct by CT EXAM: BILATERAL CAROTID DUPLEX ULTRASOUND TECHNIQUE: Pearline Cables scale imaging, color Doppler and duplex ultrasound were performed of bilateral carotid and vertebral arteries in the neck. COMPARISON:  06/06/2020 FINDINGS: Criteria: Quantification of carotid stenosis is based on velocity parameters that correlate the residual internal carotid diameter with NASCET-based stenosis levels, using the  diameter of the distal internal carotid lumen as the denominator for stenosis measurement. The following velocity measurements were obtained: RIGHT ICA: 72/11 cm/sec CCA: 0000000 cm/sec SYSTOLIC ICA/CCA RATIO:  0.9 ECA: 102 cm/sec LEFT ICA: 60/14 cm/sec CCA: 123456 cm/sec SYSTOLIC ICA/CCA RATIO:  0.8 ECA: 76 cm/sec RIGHT CAROTID ARTERY: Minor echogenic shadowing plaque formation. No hemodynamically significant right ICA stenosis, velocity elevation, or turbulent flow. Degree of narrowing less than 50%. RIGHT VERTEBRAL ARTERY:  Normal antegrade flow LEFT CAROTID ARTERY: Similar scattered minor plaque formation. No hemodynamically significant left ICA stenosis, velocity elevation, or turbulent flow. LEFT VERTEBRAL ARTERY:  Normal antegrade flow IMPRESSION: Minor carotid atherosclerosis. No hemodynamically significant ICA stenosis. Degree of narrowing less than 50% bilaterally by ultrasound criteria. Patent antegrade vertebral flow bilaterally Electronically Signed   By: Jerilynn Mages.  Shick M.D.   On: 06/07/2020 12:05   DG CHEST PORT 1 VIEW  Result Date: 06/09/2020 CLINICAL DATA:  Shortness of breath, COVID pneumonia. EXAM: PORTABLE CHEST 1 VIEW COMPARISON:  06/01/2020 CT chest and chest radiograph. FINDINGS: Trachea is midline. Heart size normal. Slight interval improvement in patchy peripheral pulmonary parenchymal airspace opacities when compared with 06/01/2020. No definite pleural fluid. IMPRESSION: Improving COVID-19 pneumonia. Electronically Signed   By: Lorin Picket M.D.   On: 06/09/2020 11:59   DG Chest Port 1 View  Result Date: 06/01/2020 CLINICAL DATA:  PT on Bipap unable to give hx. Positive Covid test. Hx of CA. ER notes:Pt brought in by [facility] from home with c/o SOB and productive green sputum x 7 days. EMS arrived and pt was found to be 67% on RA. Pt was placed on CPAP and sats increased to 85%. Pt was also given 4 tablets of Nitro by EMS. EMS reports crackles in all lung fields. HR 150, resp 50 per EMS  EXAM: PORTABLE CHEST 1 VIEW COMPARISON:  02/12/2013 FINDINGS: Bilateral hazy airspace lung opacities are noted consistent multifocal pneumonia. No evidence of pulmonary edema. No pleural effusion or pneumothorax. Cardiac silhouette is normal in size. No mediastinal or hilar masses. Skeletal structures are grossly intact. IMPRESSION: Bilateral hazy airspace lung opacities consistent with multifocal pneumonia, pattern consistent with atypical/viral infection including COVID 19. Electronically Signed   By: Lajean Manes M.D.   On: 06/01/2020 12:37   DG Abd Portable 1V  Result Date: 06/10/2020 CLINICAL DATA:  Nasogastric tube placement EXAM: PORTABLE ABDOMEN - 1 VIEW COMPARISON:  Portable exam 1213 hours compared to 06/09/2020 FINDINGS: Tip of nasogastric tube projects over mid stomach. Patchy infiltrates in the mid to lower lungs bilaterally. Visualized bowel gas pattern unremarkable. Bones demineralized with levoconvex thoracolumbar scoliosis. IMPRESSION: Tip of nasogastric tube projects over mid stomach. Electronically Signed   By: Lavonia Dana M.D.   On: 06/10/2020 12:28   DG Abd Portable 1V  Result Date: 06/09/2020 CLINICAL DATA:  Nasogastric placement EXAM: PORTABLE ABDOMEN - 1 VIEW COMPARISON:  None. FINDINGS: Orogastric or nasogastric tube enters the stomach with its tip in the fundus. Side hole is just beyond the gastroesophageal junction. Gas pattern otherwise unremarkable. IMPRESSION: Orogastric or nasogastric tube tip in the fundus. Side hole just beyond the gastroesophageal junction. Electronically Signed   By: Nelson Chimes M.D.   On: 06/09/2020 19:01   ECHOCARDIOGRAM COMPLETE  Result Date: 06/02/2020    ECHOCARDIOGRAM REPORT   Patient Name:   JARMON CHOPP Date of Exam: 06/02/2020 Medical Rec #:  [id number]         Height:       68.0 in Accession #:    [id number]        Weight:       165.0 lb Date of Birth:  12-29-1934         BSA:          1.883 m Patient Age:    32 years          BP:            107/75 mmHg Patient Gender: M                 HR:           101 bpm. Exam Location:  [facility] Procedure: 2D Echo, Cardiac Doppler and Color Doppler Indications:    Dyspnea R06.00  History:        Patient has no prior history of Echocardiogram examinations.                 Arrythmias:Atrial Fibrillation. Pneumonia due to COVID-19 virus.  Sonographer:    Alvino Chapel RCS Referring Phys: [phone] COURAGE EMOKPAE IMPRESSIONS  1. Left ventricular ejection fraction, by estimation, is 50 to 55%. The left ventricle has low normal function. The left ventricle has no regional wall motion abnormalities. Left ventricular diastolic parameters are indeterminate.  2. Right ventricular systolic function is normal. The right ventricular size is normal. There is moderately elevated pulmonary artery systolic pressure. The estimated right ventricular systolic pressure is Q000111Q mmHg.  3. Left atrial size was mildly dilated.  4. Right atrial size was moderately dilated.  5. There is a trivial pericardial effusion posterior to the left ventricle.  6. The mitral valve is grossly normal. Mild mitral valve regurgitation.  7. Tricuspid valve regurgitation is moderate.  8. The aortic valve is tricuspid. There is mild calcification of the aortic valve. Aortic valve regurgitation is not visualized.  9. The  inferior vena cava is dilated in size with <50% respiratory variability, suggesting right atrial pressure of 15 mmHg. FINDINGS  Left Ventricle: Left ventricular ejection fraction, by estimation, is 50 to 55%. The left ventricle has low normal function. The left ventricle has no regional wall motion abnormalities. The left ventricular internal cavity size was normal in size. There is no left ventricular hypertrophy. Left ventricular diastolic parameters are indeterminate. Right Ventricle: The right ventricular size is normal. No increase in right ventricular wall thickness. Right ventricular systolic function is normal. There is moderately  elevated pulmonary artery systolic pressure. The tricuspid regurgitant velocity is 2.96 m/s, and with an assumed right atrial pressure of 15 mmHg, the estimated right ventricular systolic pressure is Q000111Q mmHg. Left Atrium: Left atrial size was mildly dilated. Right Atrium: Right atrial size was moderately dilated. Pericardium: Trivial pericardial effusion is present. The pericardial effusion is posterior to the left ventricle. Mitral Valve: The mitral valve is grossly normal. Mild to moderate mitral annular calcification. Mild mitral valve regurgitation. Tricuspid Valve: The tricuspid valve is grossly normal. Tricuspid valve regurgitation is moderate. Aortic Valve: The aortic valve is tricuspid. There is mild calcification of the aortic valve. There is mild to moderate aortic valve annular calcification. Aortic valve regurgitation is not visualized. Pulmonic Valve: The pulmonic valve was grossly normal. Pulmonic valve regurgitation is trivial. Aorta: The aortic root is normal in size and structure. Venous: The inferior vena cava is dilated in size with less than 50% respiratory variability, suggesting right atrial pressure of 15 mmHg. IAS/Shunts: No atrial level shunt detected by color flow Doppler.  LEFT VENTRICLE PLAX 2D LVIDd:         4.50 cm LVIDs:         3.00 cm LV PW:         0.90 cm LV IVS:        1.00 cm LVOT diam:     2.10 cm LV SV:         42 LV SV Index:   22 LVOT Area:     3.46 cm  RIGHT VENTRICLE TAPSE (M-mode): 1.4 cm LEFT ATRIUM             Index       RIGHT ATRIUM           Index LA diam:        3.80 cm 2.02 cm/m  RA Area:     25.60 cm LA Vol (A2C):   59.9 ml 31.81 ml/m RA Volume:   86.30 ml  45.82 ml/m LA Vol (A4C):   66.5 ml 35.31 ml/m LA Biplane Vol: 67.7 ml 35.95 ml/m  AORTIC VALVE LVOT Vmax:   64.30 cm/s LVOT Vmean:  39.700 cm/s LVOT VTI:    0.120 m  AORTA Ao Root diam: 3.60 cm MITRAL VALVE                TRICUSPID VALVE MV Area (PHT): 4.17 cm     TR Peak grad:   35.0 mmHg MV Decel  Time: 182 msec     TR Vmax:        296.00 cm/s MV E velocity: 139.00 cm/s                             SHUNTS                             Systemic  VTI:  0.12 m                             Systemic Diam: 2.10 cm Rozann Lesches MD Electronically signed by Rozann Lesches MD Signature Date/Time: 06/02/2020/3:26:04 PM    Final    CT HEAD CODE STROKE WO CONTRAST  Result Date: 06/07/2020 CLINICAL DATA:  Code stroke. Initial evaluation for acute stroke, left-sided weakness. EXAM: CT HEAD WITHOUT CONTRAST TECHNIQUE: Contiguous axial images were obtained from the base of the skull through the vertex without intravenous contrast. COMPARISON:  None available. FINDINGS: Brain: Age-related cerebral atrophy with chronic small vessel ischemic disease. Evolving area of cytotoxic edema seen involving the right insula and overlying right frontal operculum and frontal cortex, consistent with an evolving acute to early subacute right MCA distribution infarct. No associated hemorrhage or significant regional mass effect. No other acute large vessel territory infarct. No intracranial hemorrhage. No mass lesion, midline shift or mass effect. No hydrocephalus or extra-axial fluid collection. Vascular: Question hyperdense vessel at the right sylvian fissure, possibly reflecting thrombus in a right M2 branch (series 2, image 23). Scattered vascular calcifications noted within the carotid siphons. Skull: Scalp soft tissues and calvarium within normal limits. Sinuses/Orbits: Globes and orbital soft tissues demonstrate no acute finding. Patient status post ocular lens replacement on the right. Air-fluid levels noted within the right maxillary and sphenoid sinuses. Mastoid air cells are clear. Other: None. ASPECTS [facility] Stroke Program Early CT Score) - Ganglionic level infarction (caudate, lentiform nuclei, internal capsule, insula, M1-M3 cortex): 6 - Supraganglionic infarction (M4-M6 cortex): 1 Total score (0-10 with 10 being normal): 7  IMPRESSION: 1. Evolving cytotoxic edema involving the right insula and overlying right frontal lobe, consistent with acute to early subacute right MCA distribution infarct. No hemorrhage. 2. ASPECTS is 7. 3. Question hyperdense vessel at the base of the right sylvian fissure, possibly reflecting thrombus within a proximal right M2 branch. 4. Underlying atrophy with chronic small vessel ischemic disease. Critical Value/emergent results were called by telephone at the time of interpretation on 06/07/2020 at 12:08 am to provider DAVID ORTIZ , who verbally acknowledged these results. Electronically Signed   By: Jeannine Boga M.D.   On: 06/07/2020 00:10   CT ANGIO HEAD CODE STROKE  Result Date: 06/07/2020 CLINICAL DATA:  Follow-up examination for acute stroke. EXAM: CT ANGIOGRAPHY HEAD AND NECK TECHNIQUE: Multidetector CT imaging of the head and neck was performed using the standard protocol during bolus administration of intravenous contrast. Multiplanar CT image reconstructions and MIPs were obtained to evaluate the vascular anatomy. Carotid stenosis measurements (when applicable) are obtained utilizing NASCET criteria, using the distal internal carotid diameter as the denominator. CONTRAST:  14mL OMNIPAQUE IOHEXOL 350 MG/ML SOLN COMPARISON:  Prior head CT from 06/06/2020. FINDINGS: Delete that CTA NECK FINDINGS Aortic arch: Visualized aortic arch of normal caliber with normal 3 vessel morphology. Moderate atheromatous change about the arch and origin of the great vessels without hemodynamically significant stenosis. Right carotid system: Right common carotid artery patent from its origin to the bifurcation without stenosis. Scattered calcified plaque about the right bifurcation without hemodynamically significant stenosis. Right ICA patent distally to the skull base without stenosis, dissection or occlusion. Left carotid system: Left common and internal carotid arteries patent without stenosis, dissection  or occlusion. No significant atheromatous change or irregularity about the left bifurcation. Vertebral arteries: Both vertebral arteries arise from subclavian arteries. No proximal subclavian artery stenosis. Both vertebral arteries widely  patent within the neck without stenosis, dissection or occlusion. Skeleton: No visible acute osseous abnormality. No discrete or worrisome osseous lesions. Moderate cervical spondylosis noted at C4-5 and C5-6. Other neck: No other acute soft tissue abnormality within the neck. No mass or adenopathy. Upper chest: Extensive multifocal ground-glass opacity seen throughout the visualized lungs, right worse than left, concerning for severe multifocal pneumonia. Review of the MIP images confirms the above findings CTA HEAD FINDINGS Anterior circulation: Petrous segments patent bilaterally. Mild atheromatous change within the carotid siphons without hemodynamically significant stenosis. A1 segments widely patent. Normal anterior communicating artery complex. Anterior cerebral arteries patent to their distal aspects without stenosis. No M1 stenosis or occlusion. Normal MCA bifurcations. On the right, there is acute occlusion of a proximal right M2 branch, superior division (series 7, image 88). Inferior division and its branches remain patent. Contralateral left MCA branches well perfused and patent. Posterior circulation: Both V4 segments patent to the vertebrobasilar junction without stenosis. Right PICA patent and normal. Left PICA not seen. Basilar patent to its distal aspect without stenosis. Superior cerebellar arteries patent bilaterally. Both PCAs primarily supplied via the basilar well perfused or distal aspects. Venous sinuses: Grossly patent allowing for timing the contrast bolus. Hypoplastic left transverse and sigmoid sinuses not well assessed. Anatomic variants: None significant.  No aneurysm. Review of the MIP images confirms the above findings IMPRESSION: 1. Acute right M2  branch occlusion, superior division, in keeping with the previously identified acute to early subacute right MCA distribution infarct. 2. Otherwise wide patency of the major arterial vasculature of the head and neck. 3. Extensive ground-glass opacity throughout the visualized lungs, right worse than left, concerning for severe multifocal pneumonia. Electronically Signed   By: Jeannine Boga M.D.   On: 06/07/2020 02:03   US Abdomen Limited RUQ (LIVER/GB)  Result Date: 06/09/2020 CLINICAL DATA:  Elevated bilirubin levels. EXAM: ULTRASOUND ABDOMEN LIMITED RIGHT UPPER QUADRANT COMPARISON:  None. FINDINGS: Gallbladder: Gallbladder sludge. Stone within the neck of gallbladder measures 1.4 cm. No gallbladder wall thickening or pericholecystic fluid. Negative sonographic Murphy's sign. Common bile duct: Diameter: 3.9 mm Liver: Increased parenchymal echogenicity suggestive of hepatic steatosis. No focal liver lesion. Portal vein is patent on color Doppler imaging with normal direction of blood flow towards the liver. Other: None. IMPRESSION: 1. Gallbladder sludge and gallstone. No secondary signs of acute cholecystitis. 2. No evidence for biliary ductal dilatation. 3. Echogenic liver suggestive of hepatic steatosis. Electronically Signed   By: Kerby Moors M.D.   On: 06/09/2020 16:10

## 2020-06-18 NOTE — Plan of Care (Signed)
Problem: Safety: Goal: Non-violent Restraint(s) Outcome: Progressing   Problem: Education: Goal: Knowledge of General Education information will improve Description: Including pain rating scale, medication(s)/side effects and non-pharmacologic comfort measures Outcome: Progressing   Problem: Health Behavior/Discharge Planning: Goal: Ability to manage health-related needs will improve Outcome: Progressing   Problem: Clinical Measurements: Goal: Ability to maintain clinical measurements within normal limits will improve Outcome: Progressing Goal: Will remain free from infection Outcome: Progressing Goal: Diagnostic test results will improve Outcome: Progressing Goal: Respiratory complications will improve Outcome: Progressing Goal: Cardiovascular complication will be avoided Outcome: Progressing   Problem: Activity: Goal: Risk for activity intolerance will decrease Outcome: Progressing   Problem: Nutrition: Goal: Adequate nutrition will be maintained Outcome: Progressing   Problem: Coping: Goal: Level of anxiety will decrease Outcome: Progressing   Problem: Elimination: Goal: Will not experience complications related to bowel motility Outcome: Progressing Goal: Will not experience complications related to urinary retention Outcome: Progressing   Problem: Pain Managment: Goal: General experience of comfort will improve Outcome: Progressing   Problem: Safety: Goal: Ability to remain free from injury will improve Outcome: Progressing   Problem: Skin Integrity: Goal: Risk for impaired skin integrity will decrease Outcome: Progressing   Problem: Education: Goal: Knowledge of disease or condition will improve Outcome: Progressing Goal: Knowledge of secondary prevention will improve Outcome: Progressing Goal: Knowledge of patient specific risk factors addressed and post discharge goals established will improve Outcome: Progressing Goal: Individualized  Educational Video(s) Outcome: Progressing   Problem: Coping: Goal: Will verbalize positive feelings about self Outcome: Progressing   Problem: Health Behavior/Discharge Planning: Goal: Ability to manage health-related needs will improve Outcome: Progressing   Problem: Self-Care: Goal: Ability to participate in self-care as condition permits will improve Outcome: Progressing Goal: Verbalization of feelings and concerns over difficulty with self-care will improve Outcome: Progressing Goal: Ability to communicate needs accurately will improve Outcome: Progressing   Problem: Nutrition: Goal: Dietary intake will improve Outcome: Progressing   Problem: Ischemic Stroke/TIA Tissue Perfusion: Goal: Complications of ischemic stroke/TIA will be minimized Outcome: Progressing   Problem: Education: Goal: Knowledge of risk factors and measures for prevention of condition will improve Outcome: Progressing   Problem: Education: Goal: Knowledge of secondary prevention will improve Outcome: Progressing   Problem: Consults Goal: RH STROKE PATIENT EDUCATION Description: See Patient Education module for education specifics  Outcome: Progressing Goal: Nutrition Consult-if indicated Outcome: Progressing Goal: Diabetes Guidelines if Diabetic/Glucose > 140 Description: If diabetic or lab glucose is > 140 mg/dl - Initiate Diabetes/Hyperglycemia Guidelines & Document Interventions  Outcome: Progressing   Problem: RH BOWEL ELIMINATION Goal: RH STG MANAGE BOWEL WITH ASSISTANCE Description: STG Manage Bowel with Assistance. Outcome: Progressing Goal: RH STG MANAGE BOWEL W/MEDICATION W/ASSISTANCE Description: STG Manage Bowel with Medication with Assistance. Outcome: Progressing   Problem: RH BLADDER ELIMINATION Goal: RH STG MANAGE BLADDER WITH ASSISTANCE Description: STG Manage Bladder With Assistance Outcome: Progressing Goal: RH STG MANAGE BLADDER WITH MEDICATION WITH  ASSISTANCE Description: STG Manage Bladder With Medication With Assistance. Outcome: Progressing Goal: RH STG MANAGE BLADDER WITH EQUIPMENT WITH ASSISTANCE Description: STG Manage Bladder With Equipment With Assistance Outcome: Progressing   Problem: RH SKIN INTEGRITY Goal: RH STG SKIN FREE OF INFECTION/BREAKDOWN Outcome: Progressing Goal: RH STG MAINTAIN SKIN INTEGRITY WITH ASSISTANCE Description: STG Maintain Skin Integrity With Assistance. Outcome: Progressing Goal: RH STG ABLE TO PERFORM INCISION/WOUND CARE W/ASSISTANCE Description: STG Able To Perform Incision/Wound Care With Assistance. Outcome: Progressing   Problem: RH SAFETY Goal: RH STG ADHERE TO SAFETY PRECAUTIONS W/ASSISTANCE/DEVICE Description: STG Adhere  to Safety Precautions With Assistance/Device. Outcome: Progressing Goal: RH STG DECREASED RISK OF FALL WITH ASSISTANCE Description: STG Decreased Risk of Fall With Assistance. Outcome: Progressing   Problem: RH COGNITION-NURSING Goal: RH STG USES MEMORY AIDS/STRATEGIES W/ASSIST TO PROBLEM SOLVE Description: STG Uses Memory Aids/Strategies With Assistance to Problem Solve. Outcome: Progressing Goal: RH STG ANTICIPATES NEEDS/CALLS FOR ASSIST W/ASSIST/CUES Description: STG Anticipates Needs/Calls for Assist With Assistance/Cues. Outcome: Progressing   Problem: RH PAIN MANAGEMENT Goal: RH STG PAIN MANAGED AT OR BELOW PT'S PAIN GOAL Outcome: Progressing   Problem: RH KNOWLEDGE DEFICIT Goal: RH STG INCREASE KNOWLEDGE OF DIABETES Outcome: Progressing Goal: RH STG INCREASE KNOWLEDGE OF HYPERTENSION Outcome: Progressing Goal: RH STG INCREASE KNOWLEDGE OF DYSPHAGIA/FLUID INTAKE Outcome: Progressing Goal: RH STG INCREASE KNOWLEGDE OF HYPERLIPIDEMIA Outcome: Progressing Goal: RH STG INCREASE KNOWLEDGE OF STROKE PROPHYLAXIS Outcome: Progressing   Problem: RH Vision Goal: RH LTG Vision (Specify) Outcome: Progressing   Problem: RH Pre-functional/Other  (Specify) Goal: RH LTG Pre-functional (Specify) Outcome: Progressing Goal: RH LTG Interdisciplinary (Specify) 1 Description: RH LTG Interdisciplinary (Specify)1 Outcome: Progressing Goal: RH LTG Interdisciplinary (Specify) 2 Description: RH LTG Interdisciplinary (Specify) 2  Outcome: Progressing   Problem: Consults Goal: RH STROKE PATIENT EDUCATION Description: See Patient Education module for education specifics  Outcome: Progressing Goal: Nutrition Consult-if indicated Outcome: Progressing Goal: Diabetes Guidelines if Diabetic/Glucose > 140 Description: If diabetic or lab glucose is > 140 mg/dl - Initiate Diabetes/Hyperglycemia Guidelines & Document Interventions  Outcome: Progressing   Problem: RH BOWEL ELIMINATION Goal: RH STG MANAGE BOWEL WITH ASSISTANCE Description: STG Manage Bowel with Assistance. Outcome: Progressing Goal: RH STG MANAGE BOWEL W/MEDICATION W/ASSISTANCE Description: STG Manage Bowel with Medication with Assistance. Outcome: Progressing   Problem: RH BLADDER ELIMINATION Goal: RH STG MANAGE BLADDER WITH ASSISTANCE Description: STG Manage Bladder With Assistance Outcome: Progressing Goal: RH STG MANAGE BLADDER WITH MEDICATION WITH ASSISTANCE Description: STG Manage Bladder With Medication With Assistance. Outcome: Progressing Goal: RH STG MANAGE BLADDER WITH EQUIPMENT WITH ASSISTANCE Description: STG Manage Bladder With Equipment With Assistance Outcome: Progressing   Problem: RH SKIN INTEGRITY Goal: RH STG SKIN FREE OF INFECTION/BREAKDOWN Outcome: Progressing Goal: RH STG MAINTAIN SKIN INTEGRITY WITH ASSISTANCE Description: STG Maintain Skin Integrity With Assistance. Outcome: Progressing Goal: RH STG ABLE TO PERFORM INCISION/WOUND CARE W/ASSISTANCE Description: STG Able To Perform Incision/Wound Care With Assistance. Outcome: Progressing   Problem: RH SAFETY Goal: RH STG ADHERE TO SAFETY PRECAUTIONS W/ASSISTANCE/DEVICE Description: STG Adhere  to Safety Precautions With Assistance/Device. Outcome: Progressing Goal: RH STG DECREASED RISK OF FALL WITH ASSISTANCE Description: STG Decreased Risk of Fall With Assistance. Outcome: Progressing   Problem: RH COGNITION-NURSING Goal: RH STG USES MEMORY AIDS/STRATEGIES W/ASSIST TO PROBLEM SOLVE Description: STG Uses Memory Aids/Strategies With Assistance to Problem Solve. Outcome: Progressing Goal: RH STG ANTICIPATES NEEDS/CALLS FOR ASSIST W/ASSIST/CUES Description: STG Anticipates Needs/Calls for Assist With Assistance/Cues. Outcome: Progressing   Problem: RH PAIN MANAGEMENT Goal: RH STG PAIN MANAGED AT OR BELOW PT'S PAIN GOAL Outcome: Progressing   Problem: RH KNOWLEDGE DEFICIT Goal: RH STG INCREASE KNOWLEDGE OF DIABETES Outcome: Progressing Goal: RH STG INCREASE KNOWLEDGE OF HYPERTENSION Outcome: Progressing Goal: RH STG INCREASE KNOWLEDGE OF DYSPHAGIA/FLUID INTAKE Outcome: Progressing Goal: RH STG INCREASE KNOWLEGDE OF HYPERLIPIDEMIA Outcome: Progressing Goal: RH STG INCREASE KNOWLEDGE OF STROKE PROPHYLAXIS Outcome: Progressing   Problem: RH Vision Goal: RH LTG Vision (Specify) Outcome: Progressing   Problem: RH Pre-functional/Other (Specify) Goal: RH LTG Pre-functional (Specify) Outcome: Progressing Goal: RH LTG Interdisciplinary (Specify) 1 Description: RH LTG Interdisciplinary (Specify)1 Outcome: Progressing Goal: RH LTG  Interdisciplinary (Specify) 2 Description: RH LTG Interdisciplinary (Specify) 2  Outcome: Progressing

## 2020-06-18 NOTE — TOC Progression Note (Addendum)
Transition of Care [facility]) - Progression Note    Patient Details  Name: Joshua Robinson MRN: [id number] Date of Birth: 1935-02-16  Transition of Care [facility]) CM/SW [place], LCSW Phone Number: 06/18/2020, 12:54 PM  Clinical Narrative:    12:54pm-CSW spoke with patient's daughter in law, Joshua Robinson. Family is in agreement with hospice at home with [place] sent referral to [facility] for review and DME delivery.    3pm-CSW spoke with [facility], they have not been able to order needed DME yet but will work on it the next hour for tomorrow.     Expected Discharge Plan: [place] Barriers to Discharge: Continued Medical Work up  Expected Discharge Plan and Services Expected Discharge Plan: [place] In-house Referral: Clinical Social Work     Living arrangements for the past 2 months: Single Family Home                                       Social Determinants of Health (SDOH) Interventions    Readmission Risk Interventions No flowsheet data found.

## 2020-06-18 NOTE — Plan of Care (Signed)

## 2020-06-18 NOTE — Progress Notes (Signed)
Progress Note     Subjective: PEG placement delayed secondary to emergency procedure yesterday, will plan to proceed today. Patient remains agreeable and without questions.   Objective: Vital signs in last 24 hours: Temp:  [97.7 F (36.5 C)-98.9 F (37.2 C)] 97.8 F (36.6 C) (02/02 0728) Pulse Rate:  [85-104] 91 (02/02 0728) Resp:  [17-20] 18 (02/02 0728) BP: (92-125)/(49-65) 107/52 (02/02 0728) SpO2:  [92 %-94 %] 92 % (02/02 0728) Last BM Date: 06/17/20  Intake/Output from previous day: 02/01 0701 - 02/02 0700 In: 706.4 [I.V.:389.7; NG/GT:316.7] Out: 1950 [Urine:1950] Intake/Output this shift: No intake/output data recorded.  PE: Constitutional: NAD;resting in bed;facial droop, no deformites Eyes: Moist conjunctiva; no lid lag; anicteric; PERRL Neck: Trachea midline; no thyromegaly Lungs: Normal respiratory effort; no tactile fremitus CV: RRR; no palpable thrills; no pitting edema GI: Abdsoft, non-tender, +BS,no palpable hepatosplenomegaly, no previous surgical scars MSK:L hemiparesis,no clubbing/cyanosis Psychiatric: Appropriate affect; alert and oriented x3 Lymphatic: No palpable cervical or axillary lymphadenopathy  Lab Results:  Recent Labs    06/17/20 0817 06/18/20 0218  WBC 23.1* 18.6*  HGB 13.3 12.2*  HCT 39.7 35.6*  PLT 121* 119*   BMET Recent Labs    06/17/20 0817 06/18/20 0218  NA 132* 135  K 4.8 4.4  CL 101 104  CO2 22 21*  GLUCOSE 95 96  BUN 45* 40*  CREATININE 0.79 0.70  CALCIUM 8.0* 7.8*   PT/INR No results for input(s): LABPROT, INR in the last 72 hours. CMP     Component Value Date/Time   NA 135 06/18/2020 0218   K 4.4 06/18/2020 0218   CL 104 06/18/2020 0218   CO2 21 (L) 06/18/2020 0218   GLUCOSE 96 06/18/2020 0218   BUN 40 (H) 06/18/2020 0218   CREATININE 0.70 06/18/2020 0218   CALCIUM 7.8 (L) 06/18/2020 0218   PROT 5.1 (L) 06/17/2020 0817   ALBUMIN 2.5 (L) 06/17/2020 0817   AST 36 06/17/2020 0817   ALT 96 (H)  06/17/2020 0817   ALKPHOS 76 06/17/2020 0817   BILITOT 2.1 (H) 06/17/2020 0817   GFRNONAA >60 06/18/2020 0218   GFRAA >90 02/21/2013 0400   Lipase  No results found for: LIPASE     Studies/Results: No results found.  Anti-infectives: Anti-infectives (From admission, onward)   Start     Dose/Rate Route Frequency Ordered Stop   06/12/20 1600  fluconazole (DIFLUCAN) tablet 100 mg  Status:  Discontinued        100 mg Per Tube Daily 06/12/20 1507 06/13/20 1329   06/02/20 1000  cefTRIAXone (ROCEPHIN) 2 g in sodium chloride 0.9 % 100 mL IVPB  Status:  Discontinued        2 g 200 mL/hr over 30 Minutes Intravenous Every 24 hours 06/02/20 0638 06/04/20 1004   06/02/20 1000  azithromycin (ZITHROMAX) 500 mg in sodium chloride 0.9 % 250 mL IVPB  Status:  Discontinued        500 mg 250 mL/hr over 60 Minutes Intravenous Every 24 hours 06/02/20 0638 06/04/20 1004   06/02/20 0600  vancomycin (VANCOREADY) IVPB 750 mg/150 mL  Status:  Discontinued        750 mg 150 mL/hr over 60 Minutes Intravenous Every 12 hours 06/01/20 1333 06/02/20 1136   06/01/20 1245  vancomycin (VANCOREADY) IVPB 1500 mg/300 mL        1,500 mg 150 mL/hr over 120 Minutes Intravenous  Once 06/01/20 1239 06/01/20 1627   06/01/20 1230  cefTRIAXone (ROCEPHIN) 1 g in sodium  chloride 0.9 % 100 mL IVPB        1 g 200 mL/hr over 30 Minutes Intravenous  Once 06/01/20 1223 06/01/20 1337   06/01/20 1230  azithromycin (ZITHROMAX) 500 mg in sodium chloride 0.9 % 250 mL IVPB        500 mg 250 mL/hr over 60 Minutes Intravenous  Once 06/01/20 1223 06/01/20 1425       Assessment/Plan A.fib with RVR - resolved, on full dose Lovenox (AM dose held for procedure today) Thrombocytopenia  Metabolic encephalopathy  COPD Oral Thrush GOC - palliative following, full scope of care currently, pt made DNR.  Acute Hypoxic Resp. 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 - PEG tube discussed with patient and he is agreeable. Will also call his family to discuss and to get written consent.  - Plan for PEG today 2/2 around 1345, in Endoscopy  LOS: 17 days    Norm Parcel , [facility] 06/18/2020, 8:27 AM Please see Amion for pager number during day hours 7:00am-4:30pm

## 2020-06-18 NOTE — Progress Notes (Signed)
SLP Cancellation Note  Patient Details Name: Joshua Robinson MRN: [id number] DOB: [birth year]   Cancelled treatment:       Reason Eval/Treat Not Completed: Other (comment) (Pt's family has decided on comfort measures only and SLP orders have been cancelled by Dr. Nena Alexander)  Terrell I. Hardin Negus, [place], [place] Office number [phone] Pager [phone]  Horton Marshall 06/18/2020, 12:14 PM

## 2020-06-18 NOTE — Progress Notes (Signed)
Physical Therapy Treatment Patient Details Name: Joshua Robinson MRN: [id number] DOB: 1934-10-24 Today's Date: 06/18/2020    History of Present Illness 86 year old male with no documented chronic medical problems, but is a reformed smoker of 50 pack years presenting with myalgias, coughing, and shortness of breath that began on 05/24/2020.  The patient states that he visited his PCP on a virtual visit on 05/27/2019.  He was given oral steroids and azithromycin.  His symptoms did not improve.  He continued to have worsening shortness of breath, coughing.  He did have some intermittent chest discomfort with coughing but denied any hemoptysis.  He had some loose stools but did not have any hematochezia or melena.  He also had subjective fevers and chills at home.  He has had 2 doses of the Moderna vaccination but has not been boosted.  EMS was activated and the patient was noted to have oxygen saturation 67% on room air.  He was placed on CPAP and brought to the hospital.  In the emergency department, the patient was subsequently placed on high flow nasal cannula.  He did not tolerate it well and was placed on BiPAP.  He has been subsequently transitioned to heated high flow at 40 L.  The patient was given Actemra and started on intravenous steroids.  In addition, the patient was noted to have atrial fibrillation with RVR.  He was started on a diltiazem drip. New onset of LUE/LLE hemiplegia due to recent right MCA sroke    PT Comments    Assisted NT with getting pt to [facility] for washing back. Pt with improved attention to therapist on L side able to cross midline with his vision, continue to have neglect of L side, increased commands to watch PROM on L side. Pt requires maxA for coming to EoB, able to follow commands for movement of R side to pull against bed rail with R hand, requires assist for all movement of L side. Pt able to tolerate sitting EoB ~ 8 min for NT to wash his back and to work on sitting  balance. Pt continues to require total A for maintaining seated balance. D/c plans remain appropriate at this time. PT will continue to follow acutely.    Follow Up Recommendations  SNF;LTACH     Equipment Recommendations  Other (comment) (defer to post acute)       Precautions / Restrictions Precautions Precautions: Fall;Other (comment) Precaution Comments: Cortrak, fragile skin, L hemi Restrictions Weight Bearing Restrictions: No    Mobility  Bed Mobility Overal bed mobility: Needs Assistance Bed Mobility: Rolling;Supine to Sit;Sit to Supine Rolling: Total assist;Max assist   Supine to sit: Max assist Sit to supine: Total assist;+2 for physical assistance   General bed mobility comments: max assist rolling L, total assist rolling R, +rail  Transfers                 General transfer comment: pt scheduled for PEG placement later, deferred transfer to chair  Ambulation/Gait             General Gait Details: nonambulatory      Modified Rankin (Stroke Patients Only) Modified Rankin (Stroke Patients Only) Pre-Morbid Rankin Score: No symptoms Modified Rankin: Severe disability     Balance Overall balance assessment: Needs assistance Sitting-balance support: Single extremity supported;Feet supported Sitting balance-Leahy Scale: Zero Sitting balance - Comments: total assist to maintain sitting balance EOB. Pt tolerated EOB x 8-10 minutes for NT to bathe. Postural control: Posterior lean;Left lateral lean  Cognition Arousal/Alertness: Awake/alert Behavior During Therapy: Flat affect Overall Cognitive Status: Impaired/Different from baseline Area of Impairment: Orientation;Attention;Following commands;Awareness;Problem solving;Safety/judgement                 Orientation Level: Disoriented to;Time Current Attention Level: Sustained   Following Commands: Follows one step commands  consistently Safety/Judgement: Decreased awareness of safety;Decreased awareness of deficits Awareness: Emergent Problem Solving: Slow processing;Decreased initiation;Difficulty sequencing;Requires verbal cues;Requires tactile cues General Comments: continues to have L neglect, able to cross midline with vision to therapist in L fieldof view      Exercises General Exercises - Lower Extremity Heel Slides: AAROM;Right;PROM;Left;10 reps;Supine Other Exercises Other Exercises: PROM LUE (composite grip and extension, wrist and elbow flex ext, shoulder fle)    General Comments General comments (skin integrity, edema, etc.): VSS on RA      Pertinent Vitals/Pain Pain Assessment: No/denies pain Pain Intervention(s): Limited activity within patient's tolerance;Monitored during session;Repositioned           PT Goals (current goals can now be found in the care plan section) Acute Rehab PT Goals Patient Stated Goal: not stated PT Goal Formulation: Patient unable to participate in goal setting Time For Goal Achievement: 06/28/20 Potential to Achieve Goals: Fair Progress towards PT goals: Progressing toward goals    Frequency    Min 3X/week      PT Plan Current plan remains appropriate       AM-PAC PT "6 Clicks" Mobility   Outcome Measure  Help needed turning from your back to your side while in a flat bed without using bedrails?: Total Help needed moving from lying on your back to sitting on the side of a flat bed without using bedrails?: Total Help needed moving to and from a bed to a chair (including a wheelchair)?: Total Help needed standing up from a chair using your arms (e.g., wheelchair or bedside chair)?: Total Help needed to walk in hospital room?: Total Help needed climbing 3-5 steps with a railing? : Total 6 Click Score: 6    End of Session   Activity Tolerance: Patient tolerated treatment well Patient left: with call bell/phone within reach;in bed;with bed  alarm set Nurse Communication: Mobility status;Need for lift equipment PT Visit Diagnosis: Muscle weakness (generalized) (M62.81);Hemiplegia and hemiparesis;Other abnormalities of gait and mobility (R26.89) Hemiplegia - Right/Left: Left Hemiplegia - caused by: Cerebral infarction     Time: 3557-3220 PT Time Calculation (min) (ACUTE ONLY): 24 min  Charges:  $Therapeutic Exercise: 8-22 mins $Therapeutic Activity: 8-22 mins                     Geraldean Walen B. Migdalia Dk PT, DPT Acute Rehabilitation Services Pager [phone] Office [phone]    [place] 06/18/2020, 11:01 AM

## 2020-06-19 MED ORDER — ATROPINE SULFATE 1 % OP SOLN: 4.0000 [drp] | OPHTHALMIC | 0 refills | Status: AC | PRN

## 2020-06-19 MED ORDER — LORAZEPAM 2 MG/ML PO CONC: 1.0000 mg | ORAL | 0 refills | Status: AC | PRN

## 2020-06-19 MED ORDER — MORPHINE SULFATE (CONCENTRATE) 10 MG/0.5ML PO SOLN: 5.0000 mg | ORAL | 0 refills | Status: AC | PRN

## 2020-06-19 MED ORDER — MORPHINE SULFATE (PF) 2 MG/ML IV SOLN
2.0000 mg | INTRAVENOUS | Status: DC | PRN
  Administered 2020-06-19: 2 mg via INTRAVENOUS
  Filled 2020-06-19: qty 1

## 2020-06-19 MED ORDER — MORPHINE 100MG IN NS 100ML (1MG/ML) PREMIX INFUSION: 2.0000 mg/h | INTRAVENOUS | 0 refills | Status: AC

## 2020-06-19 MED ORDER — MORPHINE 100MG IN NS 100ML (1MG/ML) PREMIX INFUSION
2.0000 mg/h | INTRAVENOUS | Status: DC
  Administered 2020-06-19: 2 mg/h via INTRAVENOUS
  Filled 2020-06-19 (×2): qty 100

## 2020-06-19 NOTE — Progress Notes (Signed)
0.25 ml of morphine wasted in smart sink with Markus Jarvis, RN

## 2020-06-19 NOTE — Discharge Summary (Signed)
PATIENT DETAILS Name: Joshua Robinson Age: 86 y.o. Sex: male Date of Birth: [birth year] MRN: [id number]. Admitting Physician: Roxan Hockey, MD [id number], Purcell Nails, MD  Admit Date: 06/01/2020 Discharge date: 06/19/2020  Recommendations for Outpatient Follow-up:  1. Optimize comfort care  Admitted From:  Home  Disposition: Home with Downs: No  Equipment/Devices: None  Discharge Condition: Poor  CODE STATUS: DNR  Diet recommendation:  Diet Order            Diet general           Diet NPO time specified  Diet effective now                  Brief Narrative: Patient is a 85 y.o. male with no PMHx of with worsening shortness of breath, coughing-subsequently found to be COVID-19 positive-evaluated in the emergency room-found to have severe hypoxemia due to COVID-19 pneumonia-Hospital course unfortunately complicated by acute CVA-subsequently transferred to [facility].  Patient gradually improved but continued to have significant left-sided hemiplegia and dysphagia with plans to perform a PEG tube placement and subsequent discharge to SNF.  Unfortunately-patient deteriorated-with a significant aspiration episode-developed respiratory failure-after discussion with family-he was transitioned to full comfort measures.   COVID-19 vaccinated status: Vaccinated but not boosted.  Significant studies: 1/16>> CTA chest: No PE, diffuse multifocal pneumonia 1/17>> Echo: EF 50-55% 1/22>> CTA neck: Acute right M2 branch occlusion 1/22>> carotid Dopplers: Mild carotid atherosclerosis, no hemodynamically significant ICA stenosis. 1/23>> CT head: Stable size of the right MCA infarct 1/24>> MRI brain: Acute right MCA territory infarct with local mass-effect, mild amount of hemorrhage in the infarct. 1/24>> RUQ ultrasound: Gallbladder sludge/gallstone-no signs of acute cholecystitis, no evidence of biliary dilatation 1/26>> CT abdomen without contrast: Cholelithiasis,  diverticulosis, aneurysmal dilatation of infrarenal abdominal aorta   COVID-19 medications: Steroids: 1/16>> 1/26 Actemra: 1/16 x 1  Antibiotics: Rocephin: 1/16>> 1/18 Zithromax: 1/16>> 1/18  Microbiology data: 1/16>> blood culture: No growth  Procedures: None  Consults: Neurology, surgery, IR  Brief Hospital Course: Acute Hypoxic Resp Failure due to Covid 19 Viral pneumonia-now lately with aspiration pneumonia: Had improved overall-not room air-however during the latter part of his hospital stay-patient worsened-started accumulating secretions-and started having overt aspiration.  His hypoxemia worsened-and was requiring up to 5 L of oxygen.  After extensive discussion with family-he was transitioned to full comfort measures.  Plans are to discharge home with hospice care at family's request.   COVID-19 Labs:  No results for input(s): DDIMER, FERRITIN, LDH, CRP in the last 72 hours.  Lab Results  Component Value Date   [place] Not Detected 04/24/2019     Aspiration pneumonitis: See above-worsening hypoxemia on 2/2-after discussion with family-transitioned to full comfort measures.  A. fib with RVR: Continues to have A. fib with RVR-required IV Lopressor-for rate control.  No longer on anticoagulation given comfort measures in effect.  Acute embolic right MCA CVA: Remains with significant right-sided hemiplegia and dysphagia-work-up as above  Dysphagia: Due to above-PEG tube placement had been planned for 2/2-however due to acute worsening-overt aspiration-after discussion with family-this has been canceled.  Okay for comfort feeds.  Macrocytosis/vitamin B deficiency: Was on vitamin B12 supplementation-no plans to continue on discharge.   Thrombocytopenia: Mild-improving-HIT antibody negative.  Abdominal aortic aneurysm: Incidental finding-radiology recommends aortic ultrasound in 3 years  Palliative care: DNR in place-had severe Covid related hypoxia and  embolic CVA-with severe dysphagia and left hemiplegia-was gradually improving and otherwise stable-on 2/2-significantly hypoxic-overtly aspirating.  Extensive discussion with  multiple family members-including daughter-in-law [name] (RN at [facility]) son and spouse over the phone-explained poor prognosis-and that feeding tube will not change aspiration.  After extensive discussion-family is ready to transition to full comfort measures-and would prefer him coming home with hospice care.    Family is fully aware that patient is very close to end-of-life-and there is a risk that he could even pass even before transport, or while being transported to home-they are accepting of these risks.  Goal of care is for comfort.  RN pressure injury documentation: Pressure Injury 06/17/20 Buttocks Right Deep Tissue Pressure Injury - Purple or maroon localized area of discolored intact skin or blood-filled blister due to damage of underlying soft tissue from pressure and/or shear. (Active)  06/17/20 0744  Location: Buttocks  Location Orientation: Right  Staging: Deep Tissue Pressure Injury - Purple or maroon localized area of discolored intact skin or blood-filled blister due to damage of underlying soft tissue from pressure and/or shear.  Wound Description (Comments):   Present on Admission:      Pressure Injury 06/17/20 Buttocks Left Deep Tissue Pressure Injury - Purple or maroon localized area of discolored intact skin or blood-filled blister due to damage of underlying soft tissue from pressure and/or shear. (Active)  06/17/20 0745  Location: Buttocks  Location Orientation: Left  Staging: Deep Tissue Pressure Injury - Purple or maroon localized area of discolored intact skin or blood-filled blister due to damage of underlying soft tissue from pressure and/or shear.  Wound Description (Comments):   Present on Admission:      Discharge Diagnoses:  Principal Problem:   Acute respiratory failure with hypoxia Due  to Covid PNA Active Problems:   Acute respiratory disease due to COVID-19 virus   Pneumonia due to COVID-19 virus   Atrial fibrillation with RVR (HCC)   Cerebral embolism with cerebral infarction   Discharge Instructions:    Person Under Monitoring Name: DAT BJORKMAN  Location: [place] [id number]   Infection Prevention Recommendations for Individuals Confirmed to have, or Being Evaluated for, 2019 Novel Coronavirus (COVID-19) Infection Who Receive Care at Home  Individuals who are confirmed to have, or are being evaluated for, COVID-19 should follow the prevention steps below until a healthcare provider or local or state health department says they can return to normal activities.  Stay home except to get medical care You should restrict activities outside your home, except for getting medical care. Do not go to work, school, or public areas, and do not use public transportation or taxis.  Call ahead before visiting your doctor Before your medical appointment, call the healthcare provider and tell them that you have, or are being evaluated for, COVID-19 infection. This will help the healthcare provider's office take steps to keep other people from getting infected. Ask your healthcare provider to call the local or state health department.  Monitor your symptoms Seek prompt medical attention if your illness is worsening (e.g., difficulty breathing). Before going to your medical appointment, call the healthcare provider and tell them that you have, or are being evaluated for, COVID-19 infection. Ask your healthcare provider to call the local or state health department.  Wear a facemask You should wear a facemask that covers your nose and mouth when you are in the same room with other people and when you visit a healthcare provider. People who live with or visit you should also wear a facemask while they are in the same room with you.  Separate yourself  from other people in your home As much as possible, you should stay in a different room from other people in your home. Also, you should use a separate bathroom, if available.  Avoid sharing household items You should not share dishes, drinking glasses, cups, eating utensils, towels, bedding, or other items with other people in your home. After using these items, you should wash them thoroughly with soap and water.  Cover your coughs and sneezes Cover your mouth and nose with a tissue when you cough or sneeze, or you can cough or sneeze into your sleeve. Throw used tissues in a lined trash can, and immediately wash your hands with soap and water for at least 20 seconds or use an alcohol-based hand rub.  Wash your [facility] your hands often and thoroughly with soap and water for at least 20 seconds. You can use an alcohol-based hand sanitizer if soap and water are not available and if your hands are not visibly dirty. Avoid touching your eyes, nose, and mouth with unwashed hands.   Prevention Steps for Caregivers and Household Members of Individuals Confirmed to have, or Being Evaluated for, COVID-19 Infection Being Cared for in the Home  If you live with, or provide care at home for, a person confirmed to have, or being evaluated for, COVID-19 infection please follow these guidelines to prevent infection:  Follow healthcare provider's instructions Make sure that you understand and can help the patient follow any healthcare provider instructions for all care.  Provide for the patient's basic needs You should help the patient with basic needs in the home and provide support for getting groceries, prescriptions, and other personal needs.  Monitor the patient's symptoms If they are getting sicker, call his or her medical provider and tell them that the patient has, or is being evaluated for, COVID-19 infection. This will help the healthcare provider's office take steps to keep other  people from getting infected. Ask the healthcare provider to call the local or state health department.  Limit the number of people who have contact with the patient  If possible, have only one caregiver for the patient.  Other household members should stay in another home or place of residence. If this is not possible, they should stay  in another room, or be separated from the patient as much as possible. Use a separate bathroom, if available.  Restrict visitors who do not have an essential need to be in the home.  Keep older adults, very young children, and other sick people away from the patient Keep older adults, very young children, and those who have compromised immune systems or chronic health conditions away from the patient. This includes people with chronic heart, lung, or kidney conditions, diabetes, and cancer.  Ensure good ventilation Make sure that shared spaces in the home have good air flow, such as from an air conditioner or an opened window, weather permitting.  Wash your hands often  Wash your hands often and thoroughly with soap and water for at least 20 seconds. You can use an alcohol based hand sanitizer if soap and water are not available and if your hands are not visibly dirty.  Avoid touching your eyes, nose, and mouth with unwashed hands.  Use disposable paper towels to dry your hands. If not available, use dedicated cloth towels and replace them when they become wet.  Wear a facemask and gloves  Wear a disposable facemask at all times in the room and gloves when you touch or  have contact with the patient's blood, body fluids, and/or secretions or excretions, such as sweat, saliva, sputum, nasal mucus, vomit, urine, or feces.  Ensure the mask fits over your nose and mouth tightly, and do not touch it during use.  Throw out disposable facemasks and gloves after using them. Do not reuse.  Wash your hands immediately after removing your facemask and  gloves.  If your personal clothing becomes contaminated, carefully remove clothing and launder. Wash your hands after handling contaminated clothing.  Place all used disposable facemasks, gloves, and other waste in a lined container before disposing them with other household waste.  Remove gloves and wash your hands immediately after handling these items.  Do not share dishes, glasses, or other household items with the patient  Avoid sharing household items. You should not share dishes, drinking glasses, cups, eating utensils, towels, bedding, or other items with a patient who is confirmed to have, or being evaluated for, COVID-19 infection.  After the person uses these items, you should wash them thoroughly with soap and water.  Wash laundry thoroughly  Immediately remove and wash clothes or bedding that have blood, body fluids, and/or secretions or excretions, such as sweat, saliva, sputum, nasal mucus, vomit, urine, or feces, on them.  Wear gloves when handling laundry from the patient.  Read and follow directions on labels of laundry or clothing items and detergent. In general, wash and dry with the warmest temperatures recommended on the label.  Clean all areas the individual has used often  Clean all touchable surfaces, such as counters, tabletops, doorknobs, bathroom fixtures, toilets, phones, keyboards, tablets, and bedside tables, every day. Also, clean any surfaces that may have blood, body fluids, and/or secretions or excretions on them.  Wear gloves when cleaning surfaces the patient has come in contact with.  Use a diluted bleach solution (e.g., dilute bleach with 1 part bleach and 10 parts water) or a household disinfectant with a label that says EPA-registered for coronaviruses. To make a bleach solution at home, add 1 tablespoon of bleach to 1 quart (4 cups) of water. For a larger supply, add  cup of bleach to 1 gallon (16 cups) of water.  Read labels of cleaning  products and follow recommendations provided on product labels. Labels contain instructions for safe and effective use of the cleaning product including precautions you should take when applying the product, such as wearing gloves or eye protection and making sure you have good ventilation during use of the product.  Remove gloves and wash hands immediately after cleaning.  Monitor yourself for signs and symptoms of illness Caregivers and household members are considered close contacts, should monitor their health, and will be asked to limit movement outside of the home to the extent possible. Follow the monitoring steps for close contacts listed on the symptom monitoring form.   ? If you have additional questions, contact your local health department or call the epidemiologist on call at [phone] (available 24/7). ? This guidance is subject to change. For the most up-to-date guidance from CDC, please refer to their website: YouBlogs.pl    Activity:  As tolerated  Discharge Instructions    Diet general   Complete by: As directed    Okay for comfort feeding   Increase activity slowly   Complete by: As directed    No wound care   Complete by: As directed      Allergies as of 06/19/2020      Reactions   Bee Venom Other (  See Comments)   Stung a few times and passed out      Medication List    STOP taking these medications   azithromycin 250 MG tablet Commonly known as: ZITHROMAX   methylPREDNISolone 4 MG Tbpk tablet Commonly known as: MEDROL DOSEPAK     TAKE these medications   atropine 1 % ophthalmic solution Place 4 drops under the tongue every 4 (four) hours as needed (excessive secretions).   LORazepam 2 MG/ML concentrated solution Commonly known as: ATIVAN Place 0.5 mLs (1 mg total) under the tongue every 4 (four) hours as needed for anxiety or sedation.   morphine 1 mg/mL Soln infusion Inject 2  mg/hr into the vein continuous. Please titrate for comfort-increase by 1 mg/hour up to a max of 10 mg an hour   morphine CONCENTRATE 10 MG/0.5ML Soln concentrated solution Place 0.25 mLs (5 mg total) under the tongue every 2 (two) hours as needed for moderate pain (or dyspnea).       Follow-up Information    Redmond School, MD .   Specialty: Internal Medicine Contact information: [address] [place] [address] [phone]              Allergies  Allergen Reactions  . Bee Venom Other (See Comments)    Stung a few times and passed out      Other Procedures/Studies: EEG  Result Date: 06/09/2020 Lora Havens, MD     06/09/2020  9:06 PM Patient Name: Joshua Robinson MRN: [id number] Epilepsy Attending: Lora Havens Referring Physician/Provider: Dr Lesleigh Noe Date: 06/09/2020 Duration: 26.29 mins Patient history: 86yo M with AMS. EEG to evaluate for seizure Level of alertness: Awake AEDs during EEG study: None Technical aspects: This EEG study was done with scalp electrodes positioned according to the 10-20 International system of electrode placement. Electrical activity was acquired at a sampling rate of 500Hz  and reviewed with a high frequency filter of 70Hz  and a low frequency filter of 1Hz . EEG data were recorded continuously and digitally stored. Description: No posterior dominant rhythm was seen. EEG showed continuous generalized 3 to 6 Hz theta-delta slowing. Hyperventilation and photic stimulation were not performed.   ABNORMALITY -Continuous slow, generalized IMPRESSION: This study is suggestive of moderate diffuse encephalopathy, nonspecific etiology. No seizures or epileptiform discharges were seen throughout the recording. Priyanka Barbra Sarks   CT ABDOMEN WO CONTRAST  Result Date: 06/11/2020 CLINICAL DATA:  Evaluate anatomy prior to gastrostomy tube placement. History of COVID-19 infection. EXAM: CT ABDOMEN WITHOUT CONTRAST TECHNIQUE: Multidetector CT  imaging of the abdomen was performed following the standard protocol without IV contrast. COMPARISON:  Chest CT-06/01/2020 FINDINGS: Lower chest: Limited visualization of the lower thorax demonstrates bibasilar ground-glass interstitial opacities with relative subpleural sparing. Subpleural nodular opacities are seen within the imaged lung bases. Overall, there is improved aeration of lung bases compared to the 06/01/2020 examination. No air bronchograms. No pleural effusion. Normal heart size. Coronary artery calcifications. No pericardial effusion. Hepatobiliary: Normal hepatic contour. High-density material is seen within the gallbladder fossa potentially radiopaque sludge and/or stones. No noncontrast evidence of gallbladder wall thickening or pericholecystic stranding. No ascites. Pancreas: Normal noncontrast appearance of the pancreas. Spleen: Normal noncontrast appearance of the spleen. Adrenals/Urinary Tract: No renal stones. There is a minimal amount of grossly symmetric likely age and body habitus related perinephric stranding. No urine obstruction. Normal noncontrast appearance the bilateral adrenal glands. The urinary bladder was not imaged. Stomach/Bowel: The anterior wall of the mid body of the stomach is  well apposed against the ventral wall of the upper abdomen without interposition of either the hepatic parenchyma or transverse colon and the percutaneous window will likely be improved with gastric insufflation. Enteric tube tip terminates within the duodenal bulb. Scattered colonic diverticulosis. The cecum is noted to be located within the right upper abdominal quadrant. Normal noncontrast appearance of the terminal ileum and appendix. No pneumoperitoneum, pneumatosis or portal venous gas. Vascular/Lymphatic: Aneurysmal dilatation of the infrarenal abdominal aorta measuring approximately 3.0 x 3.0 x 3.0 cm as measured in greatest oblique short axis axial (image 41, series 3), coronal (image 34,  series 6) and sagittal (image 60, series 7) dimensions respectively. There is a moderate to large amount of eccentric mixed calcified and noncalcified atherosclerotic plaque throughout the abdominal aorta. Mild ectasia of the right common iliac artery measuring 1.6 cm in diameter. No bulky retroperitoneal or mesenteric lymphadenopathy on this noncontrast examination. Other: Mild diffuse body wall anasarca. Musculoskeletal: No acute or aggressive osseous abnormalities. Moderate severe multilevel lumbar spine DDD, worse at L1-L2, and a lesser extent, L3-L4 and L5-S1 with disc space height loss, endplate irregularity and sclerosis. Grade 1 anterolisthesis of L3 upon L4 measuring approximately 4 mm. No associated pars defects. IMPRESSION: 1. Gastric anatomy amenable to potential percutaneous gastrostomy tube placement as indicated. 2. Cholelithiasis without evidence of cholecystitis. 3. Colonic diverticulosis without evidence of diverticulitis. 4. Improved aeration of the lung bases compared to the 06/01/2020 examination with persistent bibasilar ground-glass interstitial opacities and subpleural nodular opacities, nonspecific though compatible with provided history of COVID-19 infection. 5. Aneurysmal dilatation of the infrarenal abdominal aorta measuring 3.0 cm in diameter. Recommend follow-up aortic ultrasound in 3 years. This recommendation follows ACR consensus guidelines: White Paper of the ACR Incidental Findings Committee II on Vascular Findings. J Am Coll Radiol 2013; 10:789-794. 6. Coronary calcifications.  Aortic Atherosclerosis (ICD10-I70.0). Electronically Signed   By: Sandi Mariscal M.D.   On: 06/11/2020 16:37   CT HEAD WO CONTRAST  Result Date: 06/08/2020 CLINICAL DATA:  Right MCA infarct EXAM: CT HEAD WITHOUT CONTRAST TECHNIQUE: Contiguous axial images were obtained from the base of the skull through the vertex without intravenous contrast. COMPARISON:  06/06/2020 FINDINGS: Brain: Stable appearance of  the right MCA territory infarct seen previously. No significant change in the size of the infarct territory. No evidence of hemorrhagic transformation. No significant mass effect or midline shift. The lateral ventricles and midline structures are unremarkable. No acute extra-axial fluid collections. Vascular: The hyperdense right M2 branch of the MCA seen previously is less pronounced on this exam. Skull: Normal. Negative for fracture or focal lesion. Sinuses/Orbits: Fluid within the bilateral maxillary sinuses. Mucosal thickening right ethmoid air cells. Other: None. IMPRESSION: 1. Stable size of the right MCA infarct seen previously. No evidence of hemorrhagic transformation. Electronically Signed   By: Randa Ngo M.D.   On: 06/08/2020 23:05   CT ANGIO NECK W OR WO CONTRAST  Result Date: 06/07/2020 CLINICAL DATA:  Follow-up examination for acute stroke. EXAM: CT ANGIOGRAPHY HEAD AND NECK TECHNIQUE: Multidetector CT imaging of the head and neck was performed using the standard protocol during bolus administration of intravenous contrast. Multiplanar CT image reconstructions and MIPs were obtained to evaluate the vascular anatomy. Carotid stenosis measurements (when applicable) are obtained utilizing NASCET criteria, using the distal internal carotid diameter as the denominator. CONTRAST:  189mL OMNIPAQUE IOHEXOL 350 MG/ML SOLN COMPARISON:  Prior head CT from 06/06/2020. FINDINGS: Delete that CTA NECK FINDINGS Aortic arch: Visualized aortic arch of normal caliber with  normal 3 vessel morphology. Moderate atheromatous change about the arch and origin of the great vessels without hemodynamically significant stenosis. Right carotid system: Right common carotid artery patent from its origin to the bifurcation without stenosis. Scattered calcified plaque about the right bifurcation without hemodynamically significant stenosis. Right ICA patent distally to the skull base without stenosis, dissection or occlusion.  Left carotid system: Left common and internal carotid arteries patent without stenosis, dissection or occlusion. No significant atheromatous change or irregularity about the left bifurcation. Vertebral arteries: Both vertebral arteries arise from subclavian arteries. No proximal subclavian artery stenosis. Both vertebral arteries widely patent within the neck without stenosis, dissection or occlusion. Skeleton: No visible acute osseous abnormality. No discrete or worrisome osseous lesions. Moderate cervical spondylosis noted at C4-5 and C5-6. Other neck: No other acute soft tissue abnormality within the neck. No mass or adenopathy. Upper chest: Extensive multifocal ground-glass opacity seen throughout the visualized lungs, right worse than left, concerning for severe multifocal pneumonia. Review of the MIP images confirms the above findings CTA HEAD FINDINGS Anterior circulation: Petrous segments patent bilaterally. Mild atheromatous change within the carotid siphons without hemodynamically significant stenosis. A1 segments widely patent. Normal anterior communicating artery complex. Anterior cerebral arteries patent to their distal aspects without stenosis. No M1 stenosis or occlusion. Normal MCA bifurcations. On the right, there is acute occlusion of a proximal right M2 branch, superior division (series 7, image 88). Inferior division and its branches remain patent. Contralateral left MCA branches well perfused and patent. Posterior circulation: Both V4 segments patent to the vertebrobasilar junction without stenosis. Right PICA patent and normal. Left PICA not seen. Basilar patent to its distal aspect without stenosis. Superior cerebellar arteries patent bilaterally. Both PCAs primarily supplied via the basilar well perfused or distal aspects. Venous sinuses: Grossly patent allowing for timing the contrast bolus. Hypoplastic left transverse and sigmoid sinuses not well assessed. Anatomic variants: None  significant.  No aneurysm. Review of the MIP images confirms the above findings IMPRESSION: 1. Acute right M2 branch occlusion, superior division, in keeping with the previously identified acute to early subacute right MCA distribution infarct. 2. Otherwise wide patency of the major arterial vasculature of the head and neck. 3. Extensive ground-glass opacity throughout the visualized lungs, right worse than left, concerning for severe multifocal pneumonia. Electronically Signed   By: Jeannine Boga M.D.   On: 06/07/2020 02:03   CT Angio Chest PE W and/or Wo Contrast  Result Date: 06/01/2020 CLINICAL DATA:  cough, COVID+, elevated d dimer EXAM: CT ANGIOGRAPHY CHEST WITH CONTRAST TECHNIQUE: Multidetector CT imaging of the chest was performed using the standard protocol during bolus administration of intravenous contrast. Multiplanar CT image reconstructions and MIPs were obtained to evaluate the vascular anatomy. CONTRAST:  62mL OMNIPAQUE IOHEXOL 350 MG/ML SOLN COMPARISON:  Xr chest 06/01/20, xr chest 02/12/13 FINDINGS: Cardiovascular: Satisfactory opacification of the pulmonary arteries to the segmental level. No evidence of pulmonary embolism. Enlarged right atria. Otherwise heart size. No significant pericardial effusion. The thoracic aorta is normal in caliber. Mild atherosclerotic plaque of the thoracic aorta. Mild 2 vessel coronary artery calcifications. Mediastinum/Nodes: No enlarged mediastinal, hilar, or axillary lymph nodes. Thyroid gland, trachea, and esophagus demonstrate no significant findings. Lungs/Pleura: Diffuse ground glass and consolidative opacities involving the majority of the lungs. Limited evaluation for pulmonary nodule due to minimal aerated lung. No definite pulmonary mass. No pleural effusion. No pneumothorax. Upper Abdomen: No acute abnormality. Musculoskeletal: No chest wall abnormality No suspicious lytic or blastic osseous lesions. No acute  displaced fracture. Multilevel  degenerative changes of the spine. Review of the MIP images confirms the above findings. IMPRESSION: 1. No pulmonary embolus. 2. Diffuse multifocal pneumonia in the setting of COVID-19 infection. 3.  Aortic Atherosclerosis (ICD10-I70.0). Electronically Signed   By: Iven Finn M.D.   On: 06/01/2020 15:26   MR BRAIN WO CONTRAST  Addendum Date: 06/09/2020   ADDENDUM REPORT: 06/09/2020 15:59 ADDENDUM: These results were called by telephone at the time of interpretation on 06/09/2020 at 3:59 pm to provider [facility] , who verbally acknowledged these results. Electronically Signed   By: Franchot Gallo M.D.   On: 06/09/2020 15:59   Result Date: 06/09/2020 CLINICAL DATA:  Stroke.  COVID positive EXAM: MRI HEAD WITHOUT CONTRAST TECHNIQUE: Multiplanar, multiecho pulse sequences of the brain and surrounding structures were obtained without intravenous contrast. COMPARISON:  CT head 06/08/2020 FINDINGS: Brain: Moderately large right MCA infarct involving the insula, operculum, middle right frontal lobe. Mild amount of hemorrhage in the infarct. Additional small areas of acute infarct in the occipital lobes bilaterally. Ventricle size normal. Generalized atrophy. 3 mm midline shift to the left. Chronic microvascular ischemic changes in the white matter. Negative for mass lesion. Vascular: Normal arterial flow voids Skull and upper cervical spine: Negative Sinuses/Orbits: Mucosal edema and air-fluid levels in the paranasal sinuses. Right cataract extraction Other: None IMPRESSION: Acute infarct right MCA territory with local mass-effect and mild midline shift. Mild amount of hemorrhage in the infarct Small acute infarcts in the occipital lobe bilaterally. Electronically Signed: By: Franchot Gallo M.D. On: 06/09/2020 15:42   US Carotid Bilateral (at [facility] and [facility] only)  Result Date: 06/07/2020 CLINICAL DATA:  Altered mental status, left-sided weakness, right MCA territory infarct by CT EXAM: BILATERAL CAROTID  DUPLEX ULTRASOUND TECHNIQUE: Pearline Cables scale imaging, color Doppler and duplex ultrasound were performed of bilateral carotid and vertebral arteries in the neck. COMPARISON:  06/06/2020 FINDINGS: Criteria: Quantification of carotid stenosis is based on velocity parameters that correlate the residual internal carotid diameter with NASCET-based stenosis levels, using the diameter of the distal internal carotid lumen as the denominator for stenosis measurement. The following velocity measurements were obtained: RIGHT ICA: 72/11 cm/sec CCA: 0000000 cm/sec SYSTOLIC ICA/CCA RATIO:  0.9 ECA: 102 cm/sec LEFT ICA: 60/14 cm/sec CCA: 123456 cm/sec SYSTOLIC ICA/CCA RATIO:  0.8 ECA: 76 cm/sec RIGHT CAROTID ARTERY: Minor echogenic shadowing plaque formation. No hemodynamically significant right ICA stenosis, velocity elevation, or turbulent flow. Degree of narrowing less than 50%. RIGHT VERTEBRAL ARTERY:  Normal antegrade flow LEFT CAROTID ARTERY: Similar scattered minor plaque formation. No hemodynamically significant left ICA stenosis, velocity elevation, or turbulent flow. LEFT VERTEBRAL ARTERY:  Normal antegrade flow IMPRESSION: Minor carotid atherosclerosis. No hemodynamically significant ICA stenosis. Degree of narrowing less than 50% bilaterally by ultrasound criteria. Patent antegrade vertebral flow bilaterally Electronically Signed   By: Jerilynn Mages.  Shick M.D.   On: 06/07/2020 12:05   DG CHEST PORT 1 VIEW  Result Date: 06/09/2020 CLINICAL DATA:  Shortness of breath, COVID pneumonia. EXAM: PORTABLE CHEST 1 VIEW COMPARISON:  06/01/2020 CT chest and chest radiograph. FINDINGS: Trachea is midline. Heart size normal. Slight interval improvement in patchy peripheral pulmonary parenchymal airspace opacities when compared with 06/01/2020. No definite pleural fluid. IMPRESSION: Improving COVID-19 pneumonia. Electronically Signed   By: Lorin Picket M.D.   On: 06/09/2020 11:59   DG Chest Port 1 View  Result Date: 06/01/2020 CLINICAL  DATA:  PT on Bipap unable to give hx. Positive Covid test. Hx of CA. ER notes:Pt brought  in by [facility] from home with c/o SOB and productive green sputum x 7 days. EMS arrived and pt was found to be 67% on RA. Pt was placed on CPAP and sats increased to 85%. Pt was also given 4 tablets of Nitro by EMS. EMS reports crackles in all lung fields. HR 150, resp 50 per EMS EXAM: PORTABLE CHEST 1 VIEW COMPARISON:  02/12/2013 FINDINGS: Bilateral hazy airspace lung opacities are noted consistent multifocal pneumonia. No evidence of pulmonary edema. No pleural effusion or pneumothorax. Cardiac silhouette is normal in size. No mediastinal or hilar masses. Skeletal structures are grossly intact. IMPRESSION: Bilateral hazy airspace lung opacities consistent with multifocal pneumonia, pattern consistent with atypical/viral infection including COVID 19. Electronically Signed   By: Lajean Manes M.D.   On: 06/01/2020 12:37   DG Abd Portable 1V  Result Date: 06/10/2020 CLINICAL DATA:  Nasogastric tube placement EXAM: PORTABLE ABDOMEN - 1 VIEW COMPARISON:  Portable exam 1213 hours compared to 06/09/2020 FINDINGS: Tip of nasogastric tube projects over mid stomach. Patchy infiltrates in the mid to lower lungs bilaterally. Visualized bowel gas pattern unremarkable. Bones demineralized with levoconvex thoracolumbar scoliosis. IMPRESSION: Tip of nasogastric tube projects over mid stomach. Electronically Signed   By: Lavonia Dana M.D.   On: 06/10/2020 12:28   DG Abd Portable 1V  Result Date: 06/09/2020 CLINICAL DATA:  Nasogastric placement EXAM: PORTABLE ABDOMEN - 1 VIEW COMPARISON:  None. FINDINGS: Orogastric or nasogastric tube enters the stomach with its tip in the fundus. Side hole is just beyond the gastroesophageal junction. Gas pattern otherwise unremarkable. IMPRESSION: Orogastric or nasogastric tube tip in the fundus. Side hole just beyond the gastroesophageal junction. Electronically Signed   By: Nelson Chimes M.D.   On:  06/09/2020 19:01   ECHOCARDIOGRAM COMPLETE  Result Date: 06/02/2020    ECHOCARDIOGRAM REPORT   Patient Name:   RICKYE CHAI Date of Exam: 06/02/2020 Medical Rec #:  [id number]         Height:       68.0 in Accession #:    [id number]        Weight:       165.0 lb Date of Birth:  1935/01/23         BSA:          1.883 m Patient Age:    66 years          BP:           107/75 mmHg Patient Gender: M                 HR:           101 bpm. Exam Location:  [facility] Procedure: 2D Echo, Cardiac Doppler and Color Doppler Indications:    Dyspnea R06.00  History:        Patient has no prior history of Echocardiogram examinations.                 Arrythmias:Atrial Fibrillation. Pneumonia due to COVID-19 virus.  Sonographer:    Alvino Chapel RCS Referring Phys: [phone] COURAGE EMOKPAE IMPRESSIONS  1. Left ventricular ejection fraction, by estimation, is 50 to 55%. The left ventricle has low normal function. The left ventricle has no regional wall motion abnormalities. Left ventricular diastolic parameters are indeterminate.  2. Right ventricular systolic function is normal. The right ventricular size is normal. There is moderately elevated pulmonary artery systolic pressure. The estimated right ventricular systolic pressure is Q000111Q mmHg.  3. Left atrial size was  mildly dilated.  4. Right atrial size was moderately dilated.  5. There is a trivial pericardial effusion posterior to the left ventricle.  6. The mitral valve is grossly normal. Mild mitral valve regurgitation.  7. Tricuspid valve regurgitation is moderate.  8. The aortic valve is tricuspid. There is mild calcification of the aortic valve. Aortic valve regurgitation is not visualized.  9. The inferior vena cava is dilated in size with <50% respiratory variability, suggesting right atrial pressure of 15 mmHg. FINDINGS  Left Ventricle: Left ventricular ejection fraction, by estimation, is 50 to 55%. The left ventricle has low normal function. The left ventricle has  no regional wall motion abnormalities. The left ventricular internal cavity size was normal in size. There is no left ventricular hypertrophy. Left ventricular diastolic parameters are indeterminate. Right Ventricle: The right ventricular size is normal. No increase in right ventricular wall thickness. Right ventricular systolic function is normal. There is moderately elevated pulmonary artery systolic pressure. The tricuspid regurgitant velocity is 2.96 m/s, and with an assumed right atrial pressure of 15 mmHg, the estimated right ventricular systolic pressure is Q000111Q mmHg. Left Atrium: Left atrial size was mildly dilated. Right Atrium: Right atrial size was moderately dilated. Pericardium: Trivial pericardial effusion is present. The pericardial effusion is posterior to the left ventricle. Mitral Valve: The mitral valve is grossly normal. Mild to moderate mitral annular calcification. Mild mitral valve regurgitation. Tricuspid Valve: The tricuspid valve is grossly normal. Tricuspid valve regurgitation is moderate. Aortic Valve: The aortic valve is tricuspid. There is mild calcification of the aortic valve. There is mild to moderate aortic valve annular calcification. Aortic valve regurgitation is not visualized. Pulmonic Valve: The pulmonic valve was grossly normal. Pulmonic valve regurgitation is trivial. Aorta: The aortic root is normal in size and structure. Venous: The inferior vena cava is dilated in size with less than 50% respiratory variability, suggesting right atrial pressure of 15 mmHg. IAS/Shunts: No atrial level shunt detected by color flow Doppler.  LEFT VENTRICLE PLAX 2D LVIDd:         4.50 cm LVIDs:         3.00 cm LV PW:         0.90 cm LV IVS:        1.00 cm LVOT diam:     2.10 cm LV SV:         42 LV SV Index:   22 LVOT Area:     3.46 cm  RIGHT VENTRICLE TAPSE (M-mode): 1.4 cm LEFT ATRIUM             Index       RIGHT ATRIUM           Index LA diam:        3.80 cm 2.02 cm/m  RA Area:     25.60  cm LA Vol (A2C):   59.9 ml 31.81 ml/m RA Volume:   86.30 ml  45.82 ml/m LA Vol (A4C):   66.5 ml 35.31 ml/m LA Biplane Vol: 67.7 ml 35.95 ml/m  AORTIC VALVE LVOT Vmax:   64.30 cm/s LVOT Vmean:  39.700 cm/s LVOT VTI:    0.120 m  AORTA Ao Root diam: 3.60 cm MITRAL VALVE                TRICUSPID VALVE MV Area (PHT): 4.17 cm     TR Peak grad:   35.0 mmHg MV Decel Time: 182 msec     TR Vmax:  296.00 cm/s MV E velocity: 139.00 cm/s                             SHUNTS                             Systemic VTI:  0.12 m                             Systemic Diam: 2.10 cm Rozann Lesches MD Electronically signed by Rozann Lesches MD Signature Date/Time: 06/02/2020/3:26:04 PM    Final    CT HEAD CODE STROKE WO CONTRAST  Result Date: 06/07/2020 CLINICAL DATA:  Code stroke. Initial evaluation for acute stroke, left-sided weakness. EXAM: CT HEAD WITHOUT CONTRAST TECHNIQUE: Contiguous axial images were obtained from the base of the skull through the vertex without intravenous contrast. COMPARISON:  None available. FINDINGS: Brain: Age-related cerebral atrophy with chronic small vessel ischemic disease. Evolving area of cytotoxic edema seen involving the right insula and overlying right frontal operculum and frontal cortex, consistent with an evolving acute to early subacute right MCA distribution infarct. No associated hemorrhage or significant regional mass effect. No other acute large vessel territory infarct. No intracranial hemorrhage. No mass lesion, midline shift or mass effect. No hydrocephalus or extra-axial fluid collection. Vascular: Question hyperdense vessel at the right sylvian fissure, possibly reflecting thrombus in a right M2 branch (series 2, image 23). Scattered vascular calcifications noted within the carotid siphons. Skull: Scalp soft tissues and calvarium within normal limits. Sinuses/Orbits: Globes and orbital soft tissues demonstrate no acute finding. Patient status post ocular lens replacement  on the right. Air-fluid levels noted within the right maxillary and sphenoid sinuses. Mastoid air cells are clear. Other: None. ASPECTS [facility] Stroke Program Early CT Score) - Ganglionic level infarction (caudate, lentiform nuclei, internal capsule, insula, M1-M3 cortex): 6 - Supraganglionic infarction (M4-M6 cortex): 1 Total score (0-10 with 10 being normal): 7 IMPRESSION: 1. Evolving cytotoxic edema involving the right insula and overlying right frontal lobe, consistent with acute to early subacute right MCA distribution infarct. No hemorrhage. 2. ASPECTS is 7. 3. Question hyperdense vessel at the base of the right sylvian fissure, possibly reflecting thrombus within a proximal right M2 branch. 4. Underlying atrophy with chronic small vessel ischemic disease. Critical Value/emergent results were called by telephone at the time of interpretation on 06/07/2020 at 12:08 am to provider DAVID ORTIZ , who verbally acknowledged these results. Electronically Signed   By: Jeannine Boga M.D.   On: 06/07/2020 00:10   CT ANGIO HEAD CODE STROKE  Result Date: 06/07/2020 CLINICAL DATA:  Follow-up examination for acute stroke. EXAM: CT ANGIOGRAPHY HEAD AND NECK TECHNIQUE: Multidetector CT imaging of the head and neck was performed using the standard protocol during bolus administration of intravenous contrast. Multiplanar CT image reconstructions and MIPs were obtained to evaluate the vascular anatomy. Carotid stenosis measurements (when applicable) are obtained utilizing NASCET criteria, using the distal internal carotid diameter as the denominator. CONTRAST:  155mL OMNIPAQUE IOHEXOL 350 MG/ML SOLN COMPARISON:  Prior head CT from 06/06/2020. FINDINGS: Delete that CTA NECK FINDINGS Aortic arch: Visualized aortic arch of normal caliber with normal 3 vessel morphology. Moderate atheromatous change about the arch and origin of the great vessels without hemodynamically significant stenosis. Right carotid system: Right  common carotid artery patent from its origin to the bifurcation without stenosis. Scattered calcified  plaque about the right bifurcation without hemodynamically significant stenosis. Right ICA patent distally to the skull base without stenosis, dissection or occlusion. Left carotid system: Left common and internal carotid arteries patent without stenosis, dissection or occlusion. No significant atheromatous change or irregularity about the left bifurcation. Vertebral arteries: Both vertebral arteries arise from subclavian arteries. No proximal subclavian artery stenosis. Both vertebral arteries widely patent within the neck without stenosis, dissection or occlusion. Skeleton: No visible acute osseous abnormality. No discrete or worrisome osseous lesions. Moderate cervical spondylosis noted at C4-5 and C5-6. Other neck: No other acute soft tissue abnormality within the neck. No mass or adenopathy. Upper chest: Extensive multifocal ground-glass opacity seen throughout the visualized lungs, right worse than left, concerning for severe multifocal pneumonia. Review of the MIP images confirms the above findings CTA HEAD FINDINGS Anterior circulation: Petrous segments patent bilaterally. Mild atheromatous change within the carotid siphons without hemodynamically significant stenosis. A1 segments widely patent. Normal anterior communicating artery complex. Anterior cerebral arteries patent to their distal aspects without stenosis. No M1 stenosis or occlusion. Normal MCA bifurcations. On the right, there is acute occlusion of a proximal right M2 branch, superior division (series 7, image 88). Inferior division and its branches remain patent. Contralateral left MCA branches well perfused and patent. Posterior circulation: Both V4 segments patent to the vertebrobasilar junction without stenosis. Right PICA patent and normal. Left PICA not seen. Basilar patent to its distal aspect without stenosis. Superior cerebellar arteries  patent bilaterally. Both PCAs primarily supplied via the basilar well perfused or distal aspects. Venous sinuses: Grossly patent allowing for timing the contrast bolus. Hypoplastic left transverse and sigmoid sinuses not well assessed. Anatomic variants: None significant.  No aneurysm. Review of the MIP images confirms the above findings IMPRESSION: 1. Acute right M2 branch occlusion, superior division, in keeping with the previously identified acute to early subacute right MCA distribution infarct. 2. Otherwise wide patency of the major arterial vasculature of the head and neck. 3. Extensive ground-glass opacity throughout the visualized lungs, right worse than left, concerning for severe multifocal pneumonia. Electronically Signed   By: Rise Mu M.D.   On: 06/07/2020 02:03   US Abdomen Limited RUQ (LIVER/GB)  Result Date: 06/09/2020 CLINICAL DATA:  Elevated bilirubin levels. EXAM: ULTRASOUND ABDOMEN LIMITED RIGHT UPPER QUADRANT COMPARISON:  None. FINDINGS: Gallbladder: Gallbladder sludge. Stone within the neck of gallbladder measures 1.4 cm. No gallbladder wall thickening or pericholecystic fluid. Negative sonographic Murphy's sign. Common bile duct: Diameter: 3.9 mm Liver: Increased parenchymal echogenicity suggestive of hepatic steatosis. No focal liver lesion. Portal vein is patent on color Doppler imaging with normal direction of blood flow towards the liver. Other: None. IMPRESSION: 1. Gallbladder sludge and gallstone. No secondary signs of acute cholecystitis. 2. No evidence for biliary ductal dilatation. 3. Echogenic liver suggestive of hepatic steatosis. Electronically Signed   By: Signa Kell M.D.   On: 06/09/2020 16:10     TODAY-DAY OF DISCHARGE:  Subjective:   Thurston Hole remains comfortable and unresponsive.  Objective:   Blood pressure (!) 125/58, pulse (!) 125, temperature 97.9 F (36.6 C), temperature source Axillary, resp. rate 20, height 5\' 8"  (1.727 m), weight  69.8 kg, SpO2 98 %.  Intake/Output Summary (Last 24 hours) at 06/19/2020 1213 Last data filed at 06/18/2020 1722 Gross per 24 hour  Intake -  Output 400 ml  Net -400 ml   Filed Weights   06/11/20 2000 06/12/20 0235 06/13/20 0500  Weight: 69.9 kg 69.9 kg 69.8 kg  Exam: Unresponsive-comfortable.   PERTINENT RADIOLOGIC STUDIES: EEG  Result Date: 06/09/2020 Lora Havens, MD     06/09/2020  9:06 PM Patient Name: OSAZE KLEINFELDT MRN: [id number] Epilepsy Attending: Lora Havens Referring Physician/Provider: Dr Lesleigh Noe Date: 06/09/2020 Duration: 26.29 mins Patient history: 86yo M with AMS. EEG to evaluate for seizure Level of alertness: Awake AEDs during EEG study: None Technical aspects: This EEG study was done with scalp electrodes positioned according to the 10-20 International system of electrode placement. Electrical activity was acquired at a sampling rate of 500Hz  and reviewed with a high frequency filter of 70Hz  and a low frequency filter of 1Hz . EEG data were recorded continuously and digitally stored. Description: No posterior dominant rhythm was seen. EEG showed continuous generalized 3 to 6 Hz theta-delta slowing. Hyperventilation and photic stimulation were not performed.   ABNORMALITY -Continuous slow, generalized IMPRESSION: This study is suggestive of moderate diffuse encephalopathy, nonspecific etiology. No seizures or epileptiform discharges were seen throughout the recording. Priyanka Barbra Sarks   CT ABDOMEN WO CONTRAST  Result Date: 06/11/2020 CLINICAL DATA:  Evaluate anatomy prior to gastrostomy tube placement. History of COVID-19 infection. EXAM: CT ABDOMEN WITHOUT CONTRAST TECHNIQUE: Multidetector CT imaging of the abdomen was performed following the standard protocol without IV contrast. COMPARISON:  Chest CT-06/01/2020 FINDINGS: Lower chest: Limited visualization of the lower thorax demonstrates bibasilar ground-glass interstitial opacities with relative subpleural  sparing. Subpleural nodular opacities are seen within the imaged lung bases. Overall, there is improved aeration of lung bases compared to the 06/01/2020 examination. No air bronchograms. No pleural effusion. Normal heart size. Coronary artery calcifications. No pericardial effusion. Hepatobiliary: Normal hepatic contour. High-density material is seen within the gallbladder fossa potentially radiopaque sludge and/or stones. No noncontrast evidence of gallbladder wall thickening or pericholecystic stranding. No ascites. Pancreas: Normal noncontrast appearance of the pancreas. Spleen: Normal noncontrast appearance of the spleen. Adrenals/Urinary Tract: No renal stones. There is a minimal amount of grossly symmetric likely age and body habitus related perinephric stranding. No urine obstruction. Normal noncontrast appearance the bilateral adrenal glands. The urinary bladder was not imaged. Stomach/Bowel: The anterior wall of the mid body of the stomach is well apposed against the ventral wall of the upper abdomen without interposition of either the hepatic parenchyma or transverse colon and the percutaneous window will likely be improved with gastric insufflation. Enteric tube tip terminates within the duodenal bulb. Scattered colonic diverticulosis. The cecum is noted to be located within the right upper abdominal quadrant. Normal noncontrast appearance of the terminal ileum and appendix. No pneumoperitoneum, pneumatosis or portal venous gas. Vascular/Lymphatic: Aneurysmal dilatation of the infrarenal abdominal aorta measuring approximately 3.0 x 3.0 x 3.0 cm as measured in greatest oblique short axis axial (image 41, series 3), coronal (image 34, series 6) and sagittal (image 60, series 7) dimensions respectively. There is a moderate to large amount of eccentric mixed calcified and noncalcified atherosclerotic plaque throughout the abdominal aorta. Mild ectasia of the right common iliac artery measuring 1.6 cm in  diameter. No bulky retroperitoneal or mesenteric lymphadenopathy on this noncontrast examination. Other: Mild diffuse body wall anasarca. Musculoskeletal: No acute or aggressive osseous abnormalities. Moderate severe multilevel lumbar spine DDD, worse at L1-L2, and a lesser extent, L3-L4 and L5-S1 with disc space height loss, endplate irregularity and sclerosis. Grade 1 anterolisthesis of L3 upon L4 measuring approximately 4 mm. No associated pars defects. IMPRESSION: 1. Gastric anatomy amenable to potential percutaneous gastrostomy tube placement as indicated. 2. Cholelithiasis without evidence of cholecystitis. 3.  Colonic diverticulosis without evidence of diverticulitis. 4. Improved aeration of the lung bases compared to the 06/01/2020 examination with persistent bibasilar ground-glass interstitial opacities and subpleural nodular opacities, nonspecific though compatible with provided history of COVID-19 infection. 5. Aneurysmal dilatation of the infrarenal abdominal aorta measuring 3.0 cm in diameter. Recommend follow-up aortic ultrasound in 3 years. This recommendation follows ACR consensus guidelines: White Paper of the ACR Incidental Findings Committee II on Vascular Findings. J Am Coll Radiol 2013; 10:789-794. 6. Coronary calcifications.  Aortic Atherosclerosis (ICD10-I70.0). Electronically Signed   By: Sandi Mariscal M.D.   On: 06/11/2020 16:37   CT HEAD WO CONTRAST  Result Date: 06/08/2020 CLINICAL DATA:  Right MCA infarct EXAM: CT HEAD WITHOUT CONTRAST TECHNIQUE: Contiguous axial images were obtained from the base of the skull through the vertex without intravenous contrast. COMPARISON:  06/06/2020 FINDINGS: Brain: Stable appearance of the right MCA territory infarct seen previously. No significant change in the size of the infarct territory. No evidence of hemorrhagic transformation. No significant mass effect or midline shift. The lateral ventricles and midline structures are unremarkable. No acute  extra-axial fluid collections. Vascular: The hyperdense right M2 branch of the MCA seen previously is less pronounced on this exam. Skull: Normal. Negative for fracture or focal lesion. Sinuses/Orbits: Fluid within the bilateral maxillary sinuses. Mucosal thickening right ethmoid air cells. Other: None. IMPRESSION: 1. Stable size of the right MCA infarct seen previously. No evidence of hemorrhagic transformation. Electronically Signed   By: Randa Ngo M.D.   On: 06/08/2020 23:05   CT ANGIO NECK W OR WO CONTRAST  Result Date: 06/07/2020 CLINICAL DATA:  Follow-up examination for acute stroke. EXAM: CT ANGIOGRAPHY HEAD AND NECK TECHNIQUE: Multidetector CT imaging of the head and neck was performed using the standard protocol during bolus administration of intravenous contrast. Multiplanar CT image reconstructions and MIPs were obtained to evaluate the vascular anatomy. Carotid stenosis measurements (when applicable) are obtained utilizing NASCET criteria, using the distal internal carotid diameter as the denominator. CONTRAST:  123mL OMNIPAQUE IOHEXOL 350 MG/ML SOLN COMPARISON:  Prior head CT from 06/06/2020. FINDINGS: Delete that CTA NECK FINDINGS Aortic arch: Visualized aortic arch of normal caliber with normal 3 vessel morphology. Moderate atheromatous change about the arch and origin of the great vessels without hemodynamically significant stenosis. Right carotid system: Right common carotid artery patent from its origin to the bifurcation without stenosis. Scattered calcified plaque about the right bifurcation without hemodynamically significant stenosis. Right ICA patent distally to the skull base without stenosis, dissection or occlusion. Left carotid system: Left common and internal carotid arteries patent without stenosis, dissection or occlusion. No significant atheromatous change or irregularity about the left bifurcation. Vertebral arteries: Both vertebral arteries arise from subclavian arteries. No  proximal subclavian artery stenosis. Both vertebral arteries widely patent within the neck without stenosis, dissection or occlusion. Skeleton: No visible acute osseous abnormality. No discrete or worrisome osseous lesions. Moderate cervical spondylosis noted at C4-5 and C5-6. Other neck: No other acute soft tissue abnormality within the neck. No mass or adenopathy. Upper chest: Extensive multifocal ground-glass opacity seen throughout the visualized lungs, right worse than left, concerning for severe multifocal pneumonia. Review of the MIP images confirms the above findings CTA HEAD FINDINGS Anterior circulation: Petrous segments patent bilaterally. Mild atheromatous change within the carotid siphons without hemodynamically significant stenosis. A1 segments widely patent. Normal anterior communicating artery complex. Anterior cerebral arteries patent to their distal aspects without stenosis. No M1 stenosis or occlusion. Normal MCA bifurcations. On the right, there  is acute occlusion of a proximal right M2 branch, superior division (series 7, image 88). Inferior division and its branches remain patent. Contralateral left MCA branches well perfused and patent. Posterior circulation: Both V4 segments patent to the vertebrobasilar junction without stenosis. Right PICA patent and normal. Left PICA not seen. Basilar patent to its distal aspect without stenosis. Superior cerebellar arteries patent bilaterally. Both PCAs primarily supplied via the basilar well perfused or distal aspects. Venous sinuses: Grossly patent allowing for timing the contrast bolus. Hypoplastic left transverse and sigmoid sinuses not well assessed. Anatomic variants: None significant.  No aneurysm. Review of the MIP images confirms the above findings IMPRESSION: 1. Acute right M2 branch occlusion, superior division, in keeping with the previously identified acute to early subacute right MCA distribution infarct. 2. Otherwise wide patency of the  major arterial vasculature of the head and neck. 3. Extensive ground-glass opacity throughout the visualized lungs, right worse than left, concerning for severe multifocal pneumonia. Electronically Signed   By: Jeannine Boga M.D.   On: 06/07/2020 02:03   CT Angio Chest PE W and/or Wo Contrast  Result Date: 06/01/2020 CLINICAL DATA:  cough, COVID+, elevated d dimer EXAM: CT ANGIOGRAPHY CHEST WITH CONTRAST TECHNIQUE: Multidetector CT imaging of the chest was performed using the standard protocol during bolus administration of intravenous contrast. Multiplanar CT image reconstructions and MIPs were obtained to evaluate the vascular anatomy. CONTRAST:  45mL OMNIPAQUE IOHEXOL 350 MG/ML SOLN COMPARISON:  Xr chest 06/01/20, xr chest 02/12/13 FINDINGS: Cardiovascular: Satisfactory opacification of the pulmonary arteries to the segmental level. No evidence of pulmonary embolism. Enlarged right atria. Otherwise heart size. No significant pericardial effusion. The thoracic aorta is normal in caliber. Mild atherosclerotic plaque of the thoracic aorta. Mild 2 vessel coronary artery calcifications. Mediastinum/Nodes: No enlarged mediastinal, hilar, or axillary lymph nodes. Thyroid gland, trachea, and esophagus demonstrate no significant findings. Lungs/Pleura: Diffuse ground glass and consolidative opacities involving the majority of the lungs. Limited evaluation for pulmonary nodule due to minimal aerated lung. No definite pulmonary mass. No pleural effusion. No pneumothorax. Upper Abdomen: No acute abnormality. Musculoskeletal: No chest wall abnormality No suspicious lytic or blastic osseous lesions. No acute displaced fracture. Multilevel degenerative changes of the spine. Review of the MIP images confirms the above findings. IMPRESSION: 1. No pulmonary embolus. 2. Diffuse multifocal pneumonia in the setting of COVID-19 infection. 3.  Aortic Atherosclerosis (ICD10-I70.0). Electronically Signed   By: Iven Finn  M.D.   On: 06/01/2020 15:26   MR BRAIN WO CONTRAST  Addendum Date: 06/09/2020   ADDENDUM REPORT: 06/09/2020 15:59 ADDENDUM: These results were called by telephone at the time of interpretation on 06/09/2020 at 3:59 pm to provider [facility] , who verbally acknowledged these results. Electronically Signed   By: Franchot Gallo M.D.   On: 06/09/2020 15:59   Result Date: 06/09/2020 CLINICAL DATA:  Stroke.  COVID positive EXAM: MRI HEAD WITHOUT CONTRAST TECHNIQUE: Multiplanar, multiecho pulse sequences of the brain and surrounding structures were obtained without intravenous contrast. COMPARISON:  CT head 06/08/2020 FINDINGS: Brain: Moderately large right MCA infarct involving the insula, operculum, middle right frontal lobe. Mild amount of hemorrhage in the infarct. Additional small areas of acute infarct in the occipital lobes bilaterally. Ventricle size normal. Generalized atrophy. 3 mm midline shift to the left. Chronic microvascular ischemic changes in the white matter. Negative for mass lesion. Vascular: Normal arterial flow voids Skull and upper cervical spine: Negative Sinuses/Orbits: Mucosal edema and air-fluid levels in the paranasal sinuses. Right cataract extraction  Other: None IMPRESSION: Acute infarct right MCA territory with local mass-effect and mild midline shift. Mild amount of hemorrhage in the infarct Small acute infarcts in the occipital lobe bilaterally. Electronically Signed: By: Franchot Gallo M.D. On: 06/09/2020 15:42   US Carotid Bilateral (at [facility] and [facility] only)  Result Date: 06/07/2020 CLINICAL DATA:  Altered mental status, left-sided weakness, right MCA territory infarct by CT EXAM: BILATERAL CAROTID DUPLEX ULTRASOUND TECHNIQUE: Pearline Cables scale imaging, color Doppler and duplex ultrasound were performed of bilateral carotid and vertebral arteries in the neck. COMPARISON:  06/06/2020 FINDINGS: Criteria: Quantification of carotid stenosis is based on velocity parameters that correlate the  residual internal carotid diameter with NASCET-based stenosis levels, using the diameter of the distal internal carotid lumen as the denominator for stenosis measurement. The following velocity measurements were obtained: RIGHT ICA: 72/11 cm/sec CCA: 0000000 cm/sec SYSTOLIC ICA/CCA RATIO:  0.9 ECA: 102 cm/sec LEFT ICA: 60/14 cm/sec CCA: 123456 cm/sec SYSTOLIC ICA/CCA RATIO:  0.8 ECA: 76 cm/sec RIGHT CAROTID ARTERY: Minor echogenic shadowing plaque formation. No hemodynamically significant right ICA stenosis, velocity elevation, or turbulent flow. Degree of narrowing less than 50%. RIGHT VERTEBRAL ARTERY:  Normal antegrade flow LEFT CAROTID ARTERY: Similar scattered minor plaque formation. No hemodynamically significant left ICA stenosis, velocity elevation, or turbulent flow. LEFT VERTEBRAL ARTERY:  Normal antegrade flow IMPRESSION: Minor carotid atherosclerosis. No hemodynamically significant ICA stenosis. Degree of narrowing less than 50% bilaterally by ultrasound criteria. Patent antegrade vertebral flow bilaterally Electronically Signed   By: Jerilynn Mages.  Shick M.D.   On: 06/07/2020 12:05   DG CHEST PORT 1 VIEW  Result Date: 06/09/2020 CLINICAL DATA:  Shortness of breath, COVID pneumonia. EXAM: PORTABLE CHEST 1 VIEW COMPARISON:  06/01/2020 CT chest and chest radiograph. FINDINGS: Trachea is midline. Heart size normal. Slight interval improvement in patchy peripheral pulmonary parenchymal airspace opacities when compared with 06/01/2020. No definite pleural fluid. IMPRESSION: Improving COVID-19 pneumonia. Electronically Signed   By: Lorin Picket M.D.   On: 06/09/2020 11:59   DG Chest Port 1 View  Result Date: 06/01/2020 CLINICAL DATA:  PT on Bipap unable to give hx. Positive Covid test. Hx of CA. ER notes:Pt brought in by [facility] from home with c/o SOB and productive green sputum x 7 days. EMS arrived and pt was found to be 67% on RA. Pt was placed on CPAP and sats increased to 85%. Pt was also given 4 tablets of  Nitro by EMS. EMS reports crackles in all lung fields. HR 150, resp 50 per EMS EXAM: PORTABLE CHEST 1 VIEW COMPARISON:  02/12/2013 FINDINGS: Bilateral hazy airspace lung opacities are noted consistent multifocal pneumonia. No evidence of pulmonary edema. No pleural effusion or pneumothorax. Cardiac silhouette is normal in size. No mediastinal or hilar masses. Skeletal structures are grossly intact. IMPRESSION: Bilateral hazy airspace lung opacities consistent with multifocal pneumonia, pattern consistent with atypical/viral infection including COVID 19. Electronically Signed   By: Lajean Manes M.D.   On: 06/01/2020 12:37   DG Abd Portable 1V  Result Date: 06/10/2020 CLINICAL DATA:  Nasogastric tube placement EXAM: PORTABLE ABDOMEN - 1 VIEW COMPARISON:  Portable exam 1213 hours compared to 06/09/2020 FINDINGS: Tip of nasogastric tube projects over mid stomach. Patchy infiltrates in the mid to lower lungs bilaterally. Visualized bowel gas pattern unremarkable. Bones demineralized with levoconvex thoracolumbar scoliosis. IMPRESSION: Tip of nasogastric tube projects over mid stomach. Electronically Signed   By: Lavonia Dana M.D.   On: 06/10/2020 12:28   DG Abd Portable 1V  Result Date:  06/09/2020 CLINICAL DATA:  Nasogastric placement EXAM: PORTABLE ABDOMEN - 1 VIEW COMPARISON:  None. FINDINGS: Orogastric or nasogastric tube enters the stomach with its tip in the fundus. Side hole is just beyond the gastroesophageal junction. Gas pattern otherwise unremarkable. IMPRESSION: Orogastric or nasogastric tube tip in the fundus. Side hole just beyond the gastroesophageal junction. Electronically Signed   By: Nelson Chimes M.D.   On: 06/09/2020 19:01   ECHOCARDIOGRAM COMPLETE  Result Date: 06/02/2020    ECHOCARDIOGRAM REPORT   Patient Name:   Joshua Robinson Date of Exam: 06/02/2020 Medical Rec #:  [id number]         Height:       68.0 in Accession #:    [id number]        Weight:       165.0 lb Date of Birth:   [birth year]         BSA:          1.883 m Patient Age:    36 years          BP:           107/75 mmHg Patient Gender: M                 HR:           101 bpm. Exam Location:  [facility] Procedure: 2D Echo, Cardiac Doppler and Color Doppler Indications:    Dyspnea R06.00  History:        Patient has no prior history of Echocardiogram examinations.                 Arrythmias:Atrial Fibrillation. Pneumonia due to COVID-19 virus.  Sonographer:    Alvino Chapel RCS Referring Phys: [phone] COURAGE EMOKPAE IMPRESSIONS  1. Left ventricular ejection fraction, by estimation, is 50 to 55%. The left ventricle has low normal function. The left ventricle has no regional wall motion abnormalities. Left ventricular diastolic parameters are indeterminate.  2. Right ventricular systolic function is normal. The right ventricular size is normal. There is moderately elevated pulmonary artery systolic pressure. The estimated right ventricular systolic pressure is Q000111Q mmHg.  3. Left atrial size was mildly dilated.  4. Right atrial size was moderately dilated.  5. There is a trivial pericardial effusion posterior to the left ventricle.  6. The mitral valve is grossly normal. Mild mitral valve regurgitation.  7. Tricuspid valve regurgitation is moderate.  8. The aortic valve is tricuspid. There is mild calcification of the aortic valve. Aortic valve regurgitation is not visualized.  9. The inferior vena cava is dilated in size with <50% respiratory variability, suggesting right atrial pressure of 15 mmHg. FINDINGS  Left Ventricle: Left ventricular ejection fraction, by estimation, is 50 to 55%. The left ventricle has low normal function. The left ventricle has no regional wall motion abnormalities. The left ventricular internal cavity size was normal in size. There is no left ventricular hypertrophy. Left ventricular diastolic parameters are indeterminate. Right Ventricle: The right ventricular size is normal. No increase in right  ventricular wall thickness. Right ventricular systolic function is normal. There is moderately elevated pulmonary artery systolic pressure. The tricuspid regurgitant velocity is 2.96 m/s, and with an assumed right atrial pressure of 15 mmHg, the estimated right ventricular systolic pressure is Q000111Q mmHg. Left Atrium: Left atrial size was mildly dilated. Right Atrium: Right atrial size was moderately dilated. Pericardium: Trivial pericardial effusion is present. The pericardial effusion is posterior to the left ventricle. Mitral Valve: The mitral valve  is grossly normal. Mild to moderate mitral annular calcification. Mild mitral valve regurgitation. Tricuspid Valve: The tricuspid valve is grossly normal. Tricuspid valve regurgitation is moderate. Aortic Valve: The aortic valve is tricuspid. There is mild calcification of the aortic valve. There is mild to moderate aortic valve annular calcification. Aortic valve regurgitation is not visualized. Pulmonic Valve: The pulmonic valve was grossly normal. Pulmonic valve regurgitation is trivial. Aorta: The aortic root is normal in size and structure. Venous: The inferior vena cava is dilated in size with less than 50% respiratory variability, suggesting right atrial pressure of 15 mmHg. IAS/Shunts: No atrial level shunt detected by color flow Doppler.  LEFT VENTRICLE PLAX 2D LVIDd:         4.50 cm LVIDs:         3.00 cm LV PW:         0.90 cm LV IVS:        1.00 cm LVOT diam:     2.10 cm LV SV:         42 LV SV Index:   22 LVOT Area:     3.46 cm  RIGHT VENTRICLE TAPSE (M-mode): 1.4 cm LEFT ATRIUM             Index       RIGHT ATRIUM           Index LA diam:        3.80 cm 2.02 cm/m  RA Area:     25.60 cm LA Vol (A2C):   59.9 ml 31.81 ml/m RA Volume:   86.30 ml  45.82 ml/m LA Vol (A4C):   66.5 ml 35.31 ml/m LA Biplane Vol: 67.7 ml 35.95 ml/m  AORTIC VALVE LVOT Vmax:   64.30 cm/s LVOT Vmean:  39.700 cm/s LVOT VTI:    0.120 m  AORTA Ao Root diam: 3.60 cm MITRAL VALVE                 TRICUSPID VALVE MV Area (PHT): 4.17 cm     TR Peak grad:   35.0 mmHg MV Decel Time: 182 msec     TR Vmax:        296.00 cm/s MV E velocity: 139.00 cm/s                             SHUNTS                             Systemic VTI:  0.12 m                             Systemic Diam: 2.10 cm Rozann Lesches MD Electronically signed by Rozann Lesches MD Signature Date/Time: 06/02/2020/3:26:04 PM    Final    CT HEAD CODE STROKE WO CONTRAST  Result Date: 06/07/2020 CLINICAL DATA:  Code stroke. Initial evaluation for acute stroke, left-sided weakness. EXAM: CT HEAD WITHOUT CONTRAST TECHNIQUE: Contiguous axial images were obtained from the base of the skull through the vertex without intravenous contrast. COMPARISON:  None available. FINDINGS: Brain: Age-related cerebral atrophy with chronic small vessel ischemic disease. Evolving area of cytotoxic edema seen involving the right insula and overlying right frontal operculum and frontal cortex, consistent with an evolving acute to early subacute right MCA distribution infarct. No associated hemorrhage or significant regional mass effect. No other acute large vessel territory infarct. No  intracranial hemorrhage. No mass lesion, midline shift or mass effect. No hydrocephalus or extra-axial fluid collection. Vascular: Question hyperdense vessel at the right sylvian fissure, possibly reflecting thrombus in a right M2 branch (series 2, image 23). Scattered vascular calcifications noted within the carotid siphons. Skull: Scalp soft tissues and calvarium within normal limits. Sinuses/Orbits: Globes and orbital soft tissues demonstrate no acute finding. Patient status post ocular lens replacement on the right. Air-fluid levels noted within the right maxillary and sphenoid sinuses. Mastoid air cells are clear. Other: None. ASPECTS [facility] Stroke Program Early CT Score) - Ganglionic level infarction (caudate, lentiform nuclei, internal capsule, insula, M1-M3 cortex): 6  - Supraganglionic infarction (M4-M6 cortex): 1 Total score (0-10 with 10 being normal): 7 IMPRESSION: 1. Evolving cytotoxic edema involving the right insula and overlying right frontal lobe, consistent with acute to early subacute right MCA distribution infarct. No hemorrhage. 2. ASPECTS is 7. 3. Question hyperdense vessel at the base of the right sylvian fissure, possibly reflecting thrombus within a proximal right M2 branch. 4. Underlying atrophy with chronic small vessel ischemic disease. Critical Value/emergent results were called by telephone at the time of interpretation on 06/07/2020 at 12:08 am to provider DAVID ORTIZ , who verbally acknowledged these results. Electronically Signed   By: Jeannine Boga M.D.   On: 06/07/2020 00:10   CT ANGIO HEAD CODE STROKE  Result Date: 06/07/2020 CLINICAL DATA:  Follow-up examination for acute stroke. EXAM: CT ANGIOGRAPHY HEAD AND NECK TECHNIQUE: Multidetector CT imaging of the head and neck was performed using the standard protocol during bolus administration of intravenous contrast. Multiplanar CT image reconstructions and MIPs were obtained to evaluate the vascular anatomy. Carotid stenosis measurements (when applicable) are obtained utilizing NASCET criteria, using the distal internal carotid diameter as the denominator. CONTRAST:  173mL OMNIPAQUE IOHEXOL 350 MG/ML SOLN COMPARISON:  Prior head CT from 06/06/2020. FINDINGS: Delete that CTA NECK FINDINGS Aortic arch: Visualized aortic arch of normal caliber with normal 3 vessel morphology. Moderate atheromatous change about the arch and origin of the great vessels without hemodynamically significant stenosis. Right carotid system: Right common carotid artery patent from its origin to the bifurcation without stenosis. Scattered calcified plaque about the right bifurcation without hemodynamically significant stenosis. Right ICA patent distally to the skull base without stenosis, dissection or occlusion. Left  carotid system: Left common and internal carotid arteries patent without stenosis, dissection or occlusion. No significant atheromatous change or irregularity about the left bifurcation. Vertebral arteries: Both vertebral arteries arise from subclavian arteries. No proximal subclavian artery stenosis. Both vertebral arteries widely patent within the neck without stenosis, dissection or occlusion. Skeleton: No visible acute osseous abnormality. No discrete or worrisome osseous lesions. Moderate cervical spondylosis noted at C4-5 and C5-6. Other neck: No other acute soft tissue abnormality within the neck. No mass or adenopathy. Upper chest: Extensive multifocal ground-glass opacity seen throughout the visualized lungs, right worse than left, concerning for severe multifocal pneumonia. Review of the MIP images confirms the above findings CTA HEAD FINDINGS Anterior circulation: Petrous segments patent bilaterally. Mild atheromatous change within the carotid siphons without hemodynamically significant stenosis. A1 segments widely patent. Normal anterior communicating artery complex. Anterior cerebral arteries patent to their distal aspects without stenosis. No M1 stenosis or occlusion. Normal MCA bifurcations. On the right, there is acute occlusion of a proximal right M2 branch, superior division (series 7, image 88). Inferior division and its branches remain patent. Contralateral left MCA branches well perfused and patent. Posterior circulation: Both V4 segments patent to the  vertebrobasilar junction without stenosis. Right PICA patent and normal. Left PICA not seen. Basilar patent to its distal aspect without stenosis. Superior cerebellar arteries patent bilaterally. Both PCAs primarily supplied via the basilar well perfused or distal aspects. Venous sinuses: Grossly patent allowing for timing the contrast bolus. Hypoplastic left transverse and sigmoid sinuses not well assessed. Anatomic variants: None significant.   No aneurysm. Review of the MIP images confirms the above findings IMPRESSION: 1. Acute right M2 branch occlusion, superior division, in keeping with the previously identified acute to early subacute right MCA distribution infarct. 2. Otherwise wide patency of the major arterial vasculature of the head and neck. 3. Extensive ground-glass opacity throughout the visualized lungs, right worse than left, concerning for severe multifocal pneumonia. Electronically Signed   By: Rise MuBenjamin  McClintock M.D.   On: 06/07/2020 02:03   US Abdomen Limited RUQ (LIVER/GB)  Result Date: 06/09/2020 CLINICAL DATA:  Elevated bilirubin levels. EXAM: ULTRASOUND ABDOMEN LIMITED RIGHT UPPER QUADRANT COMPARISON:  None. FINDINGS: Gallbladder: Gallbladder sludge. Stone within the neck of gallbladder measures 1.4 cm. No gallbladder wall thickening or pericholecystic fluid. Negative sonographic Murphy's sign. Common bile duct: Diameter: 3.9 mm Liver: Increased parenchymal echogenicity suggestive of hepatic steatosis. No focal liver lesion. Portal vein is patent on color Doppler imaging with normal direction of blood flow towards the liver. Other: None. IMPRESSION: 1. Gallbladder sludge and gallstone. No secondary signs of acute cholecystitis. 2. No evidence for biliary ductal dilatation. 3. Echogenic liver suggestive of hepatic steatosis. Electronically Signed   By: Signa Kellaylor  Stroud M.D.   On: 06/09/2020 16:10     PERTINENT LAB RESULTS: CBC: Recent Labs    06/17/20 0817 06/18/20 0218  WBC 23.1* 18.6*  HGB 13.3 12.2*  HCT 39.7 35.6*  PLT 121* 119*   CMET CMP     Component Value Date/Time   NA 135 06/18/2020 0218   K 4.4 06/18/2020 0218   CL 104 06/18/2020 0218   CO2 21 (L) 06/18/2020 0218   GLUCOSE 96 06/18/2020 0218   BUN 40 (H) 06/18/2020 0218   CREATININE 0.70 06/18/2020 0218   CALCIUM 7.8 (L) 06/18/2020 0218   PROT 5.1 (L) 06/17/2020 0817   ALBUMIN 2.5 (L) 06/17/2020 0817   AST 36 06/17/2020 0817   ALT 96 (H)  06/17/2020 0817   ALKPHOS 76 06/17/2020 0817   BILITOT 2.1 (H) 06/17/2020 0817   GFRNONAA >60 06/18/2020 0218   GFRAA >90 02/21/2013 0400    GFR Estimated Creatinine Clearance: 67.7 mL/min (by C-G formula based on SCr of 0.7 mg/dL). No results for input(s): LIPASE, AMYLASE in the last 72 hours. No results for input(s): CKTOTAL, CKMB, CKMBINDEX, TROPONINI in the last 72 hours. Invalid input(s): POCBNP No results for input(s): DDIMER in the last 72 hours. No results for input(s): HGBA1C in the last 72 hours. No results for input(s): CHOL, HDL, LDLCALC, TRIG, CHOLHDL, LDLDIRECT in the last 72 hours. No results for input(s): TSH, T4TOTAL, T3FREE, THYROIDAB in the last 72 hours.  Invalid input(s): FREET3 Recent Labs    06/17/20 0817  VITAMINB12 237  FOLATE 12.3   Coags: No results for input(s): INR in the last 72 hours.  Invalid input(s): PT Microbiology: Recent Results (from the past 240 hour(s))  Surgical PCR screen     Status: None   Collection Time: 06/18/20  9:02 AM   Specimen: Nasal Mucosa; Nasal Swab  Result Value Ref Range Status   MRSA, PCR NEGATIVE NEGATIVE Final   Staphylococcus aureus NEGATIVE NEGATIVE Final  Comment: (NOTE) The Xpert SA Assay (FDA approved for NASAL specimens in patients 74 years of age and older), is one component of a comprehensive surveillance program. It is not intended to diagnose infection nor to guide or monitor treatment. Performed at [facility], [place] [address], [place], [place] [address]     FURTHER DISCHARGE INSTRUCTIONS:  Get Medicines reviewed and adjusted: Please take all your medications with you for your next visit with your Primary MD  Laboratory/radiological data: Please request your Primary MD to go over all hospital tests and procedure/radiological results at the follow up, please ask your Primary MD to get all Hospital records sent to his/her office.  In some cases, they will be blood work, cultures and  biopsy results pending at the time of your discharge. Please request that your primary care M.D. goes through all the records of your hospital data and follows up on these results.  Also Note the following: If you experience worsening of your admission symptoms, develop shortness of breath, life threatening emergency, suicidal or homicidal thoughts you must seek medical attention immediately by calling 911 or calling your MD immediately  if symptoms less severe.  You must read complete instructions/literature along with all the possible adverse reactions/side effects for all the Medicines you take and that have been prescribed to you. Take any new Medicines after you have completely understood and accpet all the possible adverse reactions/side effects.   Do not drive when taking Pain medications or sleeping medications (Benzodaizepines)  Do not take more than prescribed Pain, Sleep and Anxiety Medications. It is not advisable to combine anxiety,sleep and pain medications without talking with your primary care practitioner  Special Instructions: If you have smoked or chewed Tobacco  in the last 2 yrs please stop smoking, stop any regular Alcohol  and or any Recreational drug use.  Wear Seat belts while driving.  Please note: You were cared for by a hospitalist during your hospital stay. Once you are discharged, your primary care physician will handle any further medical issues. Please note that NO REFILLS for any discharge medications will be authorized once you are discharged, as it is imperative that you return to your primary care physician (or establish a relationship with a primary care physician if you do not have one) for your post hospital discharge needs so that they can reassess your need for medications and monitor your lab values.  Total Time spent coordinating discharge including counseling, education and face to face time equals 35 minutes.  SignedOren Binet 06/19/2020 12:13  PM

## 2020-06-19 NOTE — TOC Progression Note (Addendum)
Transition of Care [facility]) - Progression Note    Patient Details  Name: Joshua Robinson MRN: [id number] Date of Birth: Feb 10, 1935  [facility] CM/SW [place], LCSW Phone Number: 06/19/2020, 12:23 PM  Clinical Narrative:    12:23pm-CSW received return call from [facility] reported that she thought DME was put on hold due to patient not be stable enough to transport. CSW made her aware of need for Morphine infusion. She will contact [place] for pump delivery and call CSW back.   1:45pm-CSW received return call from [facility]osp[facility]. [facility]andra stated [facility] could be called. DME to be at the house by 2pm and the morphine infusion will arrive around 5pm. She is alerting the family of instructions on medications for how to keep patient comfortable sublingually if he gets home before the morphine arrives.    Expected Discharge Plan: [place] Barriers to Discharge: Continued Medical Work up  Expected Discharge Plan and Services Expected Discharge Plan: [place] In-house Referral: Clinical Social Work     Living arrangements for the past 2 months: Single Family Home Expected Discharge Date: 06/19/20                                     Social Determinants of Health (SDOH) Interventions    Readmission Risk Interventions No flowsheet data found.

## 2020-06-19 NOTE — Progress Notes (Signed)
Family at bedside.  Patient appears comfortable at this time, no signs of pain noted.  Awaiting transport home with hospice.

## 2020-06-19 NOTE — TOC Transition Note (Signed)
Transition of Care [facility]) - CM/SW Discharge Note   Patient Details  Name: Joshua Robinson MRN: [id number] Date of Birth: 07-01-1934  [facility]) CM/SW Contact:  Benard Halsted, LCSW Phone Number: 06/19/2020, 1:55 PM   Clinical Narrative:    Patient will DC to: Home Anticipated DC date: 06/19/20 Family notified: Family at bedside Transport by: Corey Harold   Per MD patient ready for DC to home. RN, patient, patient's family, and hospice notified of DC. DC packet on chart with scripts and DNR. Ambulance transport requested for patient.   CSW will sign off for now as social work intervention is no longer needed. Please consult [place] again if new needs arise.      Final next level of care: Home w Hospice Care Barriers to Discharge: No Barriers Identified   Patient Goals and CMS Choice Patient states their goals for this hospitalization and ongoing recovery are:: Return home CMS Medicare.gov Compare Post Acute Care list provided to:: Patient Represenative (must comment) Choice offered to / list presented to : Spouse  Discharge Placement                Patient to be transferred to facility by: [place] Name of family member notified: DIL Patient and family notified of of transfer: 06/19/20  Discharge Plan and Services In-house Referral: Clinical Social Work                          [facility] Agency: [facility] spoke with at [place]: [place] ([place]) Interventions     Readmission Risk Interventions No flowsheet data found.

## 2020-06-19 NOTE — Progress Notes (Signed)
[facility] arrived to transport patient.  Belongings taken by family/spouse.  Peripheral IV left in place.  Condom cath left in place.  Advised family to notify hospice that they were on the way home. Patient out via stretcher.

## 2020-06-19 NOTE — Care Management Important Message (Signed)
Important Message  Patient Details  Name: Joshua Robinson MRN: [id number] Date of Birth: [birth year]   Medicare Important Message Given:  No (per MD note, patient is transitioning to comfort care and [place] with home hospice)     Delorse Lek 06/19/2020, 8:46 AM

## 2020-06-19 NOTE — Progress Notes (Signed)
Family at bedside.  Denies needs at this time.  Offered to turn/reposition patient, refused.  [place] refused.  [facility] has not yet arrived for transport.  Family instructed to call for any needs.

## 2020-07-15 DEATH — deceased

## 2022-12-11 IMAGING — CT CT ANGIO CHEST
2 of 6 series · 18 of 46 positions shown · IV contrast (Omnipaque or Isovue)
Comparison: Xr chest 06/01/20, xr chest 02/12/13

CLINICAL DATA: cough, COVID+, elevated d dimer

EXAM:
CT ANGIOGRAPHY CHEST WITH CONTRAST
TECHNIQUE: Multidetector CT imaging of the chest was performed using the
standard protocol during bolus administration of intravenous
contrast. Multiplanar CT image reconstructions and MIPs were
obtained to evaluate the vascular anatomy.
CONTRAST:  75mL OMNIPAQUE IOHEXOL 350 MG/ML SOLN

[Series 5: pe axial thins · axial · 0.77mm/px · z∈[+1157,+1428]mm · 15 of 297 slices shown]
[im 13/297  lung]
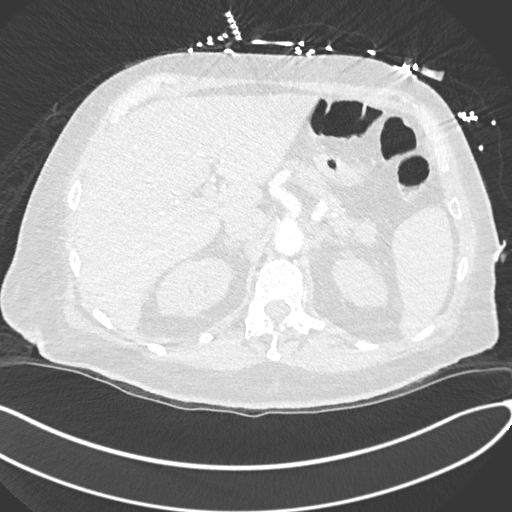
[im 39/297  soft-tissue]
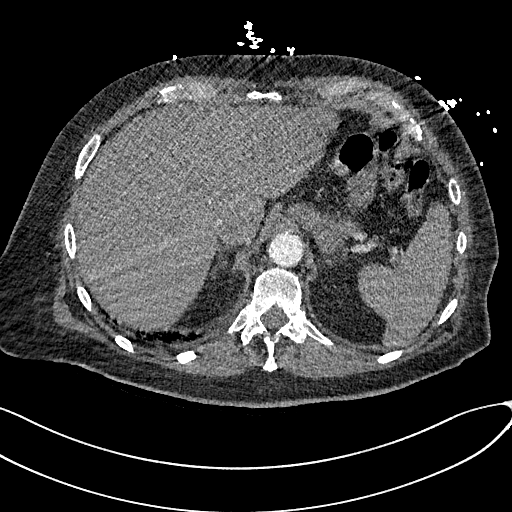
[im 52/297  lung]
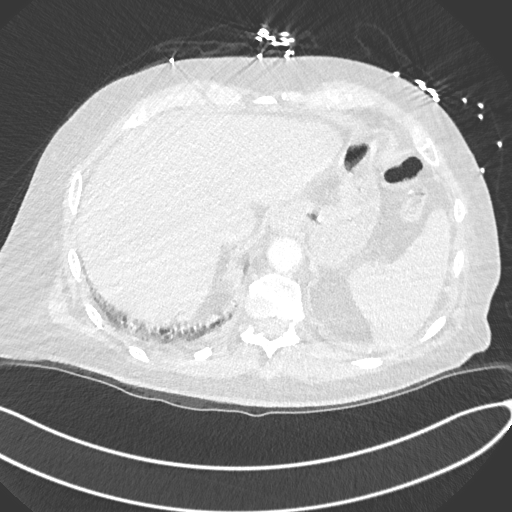
[im 78/297  soft-tissue]
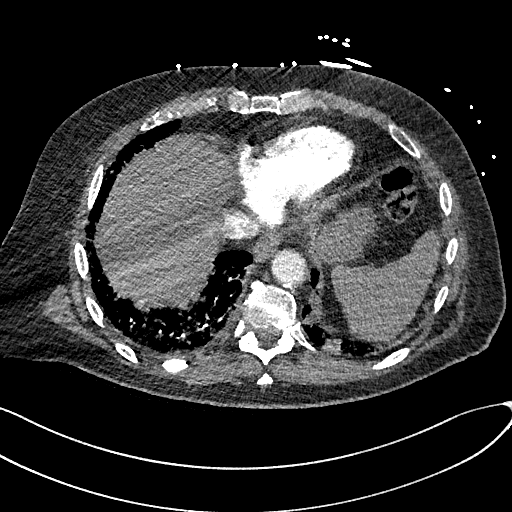
[im 91/297  lung]
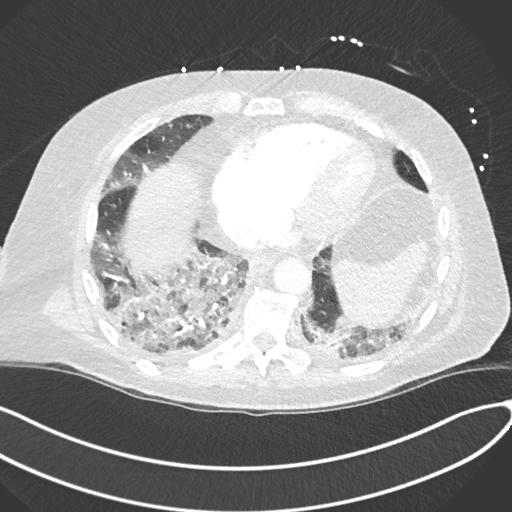
[im 116/297  soft-tissue]
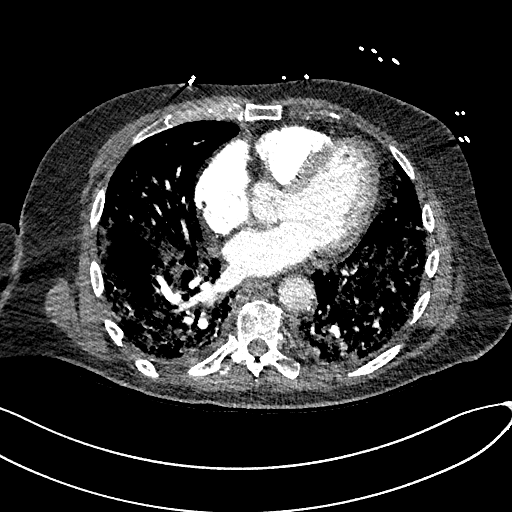
[im 129/297  lung]
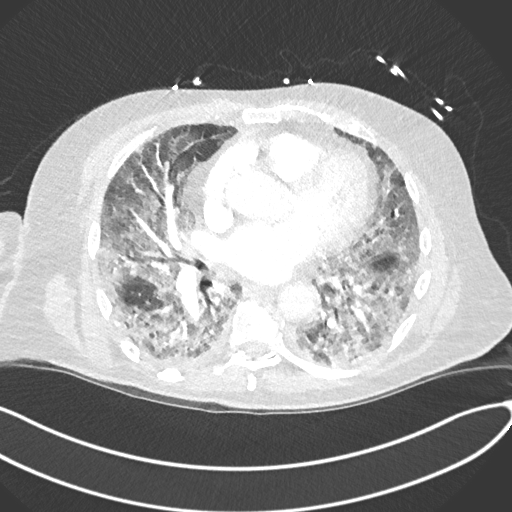
[im 155/297  soft-tissue]
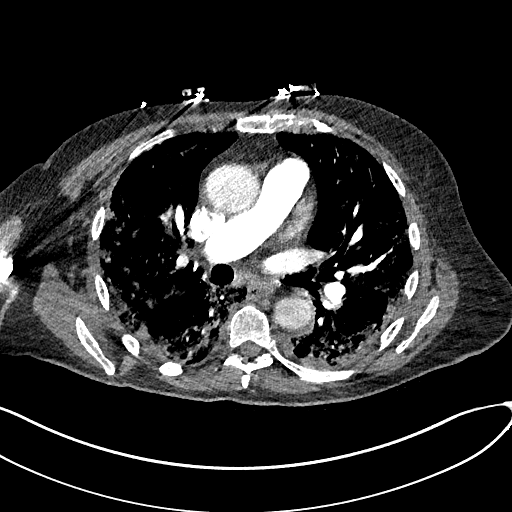
[im 168/297  lung]
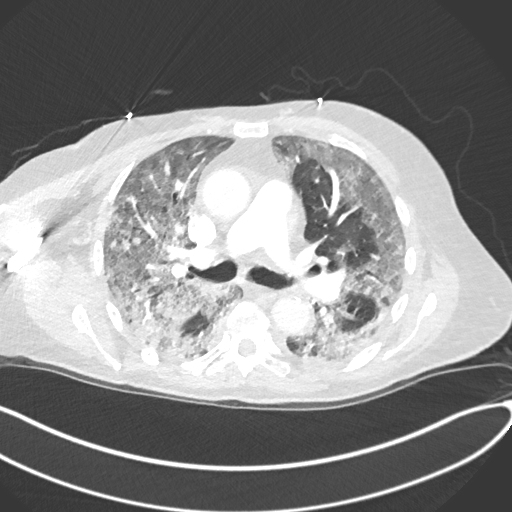
[im 181/297  soft-tissue]
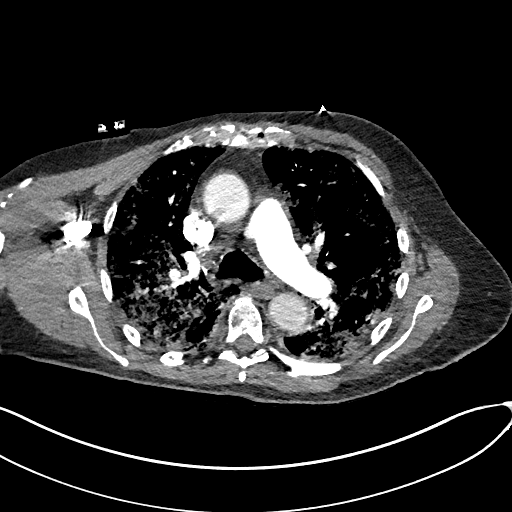
[im 206/297  lung]
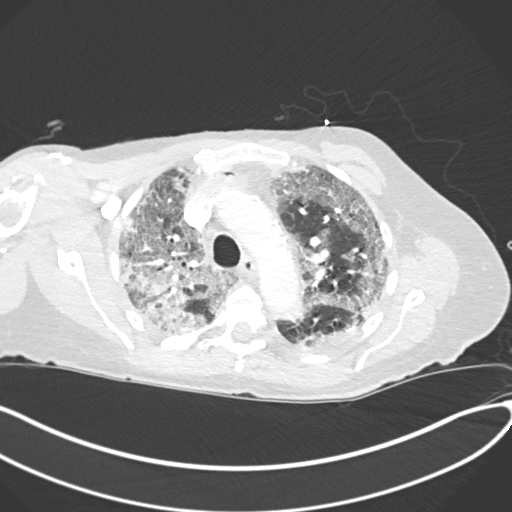
[im 219/297  soft-tissue]
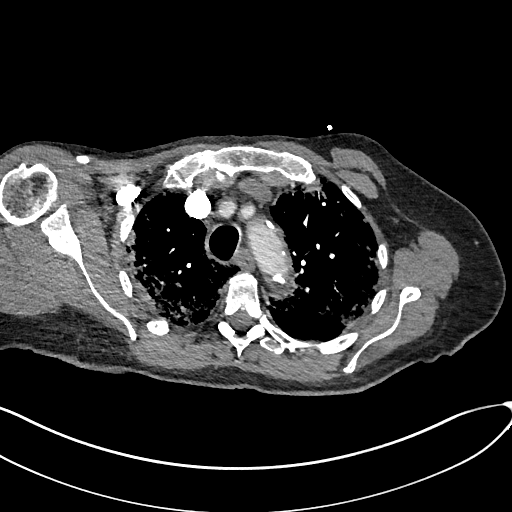
[im 245/297  lung]
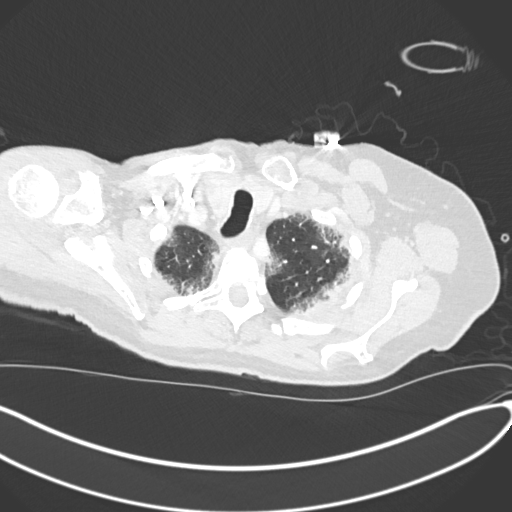
[im 258/297  soft-tissue]
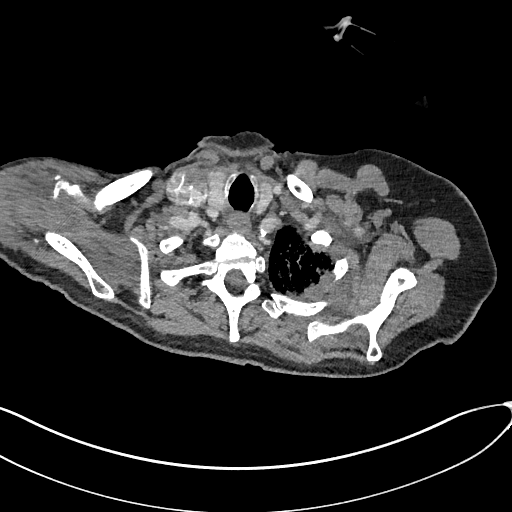
[im 284/297  lung]
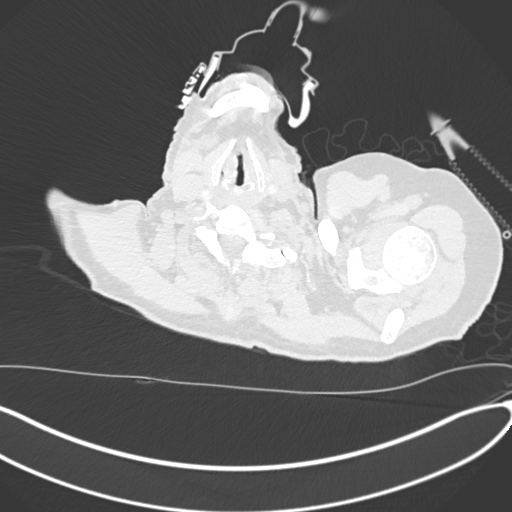

[Series 7: cor soft · coronal · 0.62mm/px · 3 of 149 slices shown]
[im 38/149  soft-tissue]
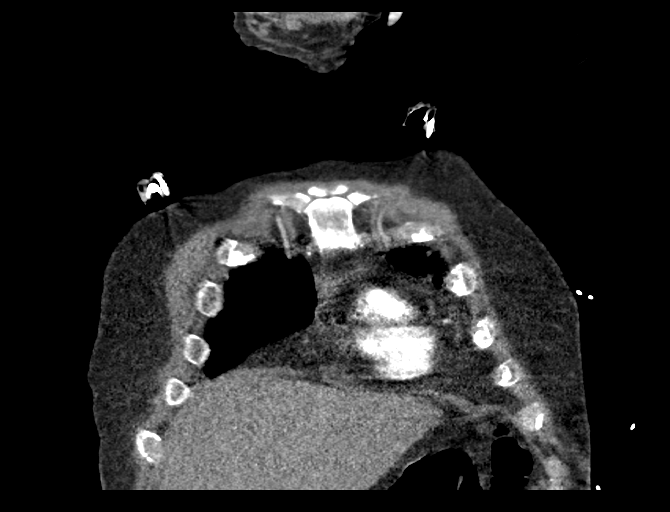
[im 75/149  soft-tissue]
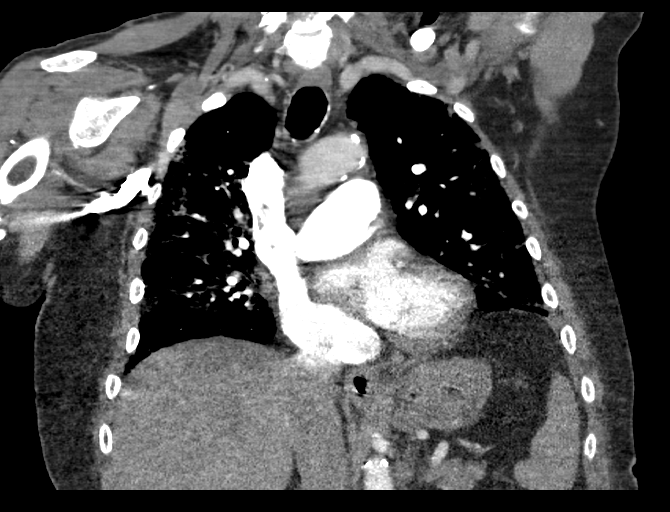
[im 112/149  soft-tissue]
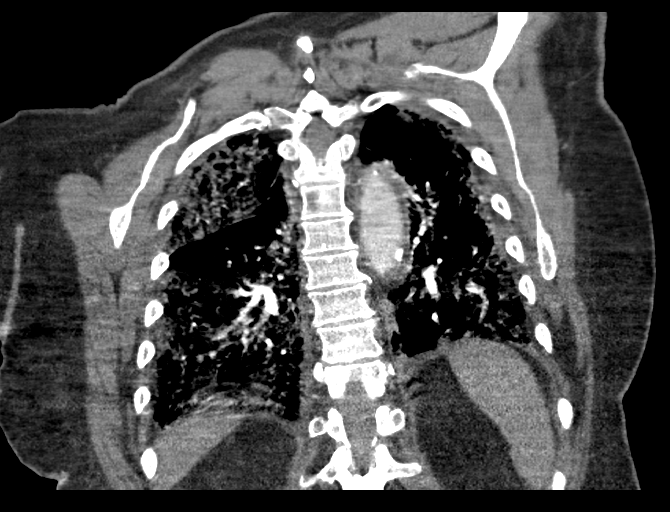

[18 of 46 positions shown; findings below may reference images not displayed]

FINDINGS: Cardiovascular: Satisfactory opacification of the pulmonary arteries
to the segmental level. No evidence of pulmonary embolism. Enlarged
right atria. Otherwise heart size. No significant pericardial
effusion. The thoracic aorta is normal in caliber. Mild
atherosclerotic plaque of the thoracic aorta. Mild 2 vessel coronary
artery calcifications.

Mediastinum/Nodes: No enlarged mediastinal, hilar, or axillary lymph
nodes. Thyroid gland, trachea, and esophagus demonstrate no
significant findings.

Lungs/Pleura: Diffuse ground glass and consolidative opacities
involving the majority of the lungs. Limited evaluation for
pulmonary nodule due to minimal aerated lung. No definite pulmonary
mass. No pleural effusion. No pneumothorax.

Upper Abdomen: No acute abnormality.

Musculoskeletal:

No chest wall abnormality

No suspicious lytic or blastic osseous lesions. No acute displaced
fracture. Multilevel degenerative changes of the spine.

Review of the MIP images confirms the above findings.
IMPRESSION: 1. No pulmonary embolus.
2. Diffuse multifocal pneumonia in the setting of [id number]
infection.
3.  Aortic Atherosclerosis ([id number]).

## 2022-12-21 IMAGING — CT CT ABDOMEN W/O CM
2 of 4 series · 14 of 46 positions shown, 16 images · non-contrast
Comparison: Chest CT-06/01/2020

CLINICAL DATA: Evaluate anatomy prior to gastrostomy tube
placement. History of E1V22-NU infection.

EXAM:
CT ABDOMEN WITHOUT CONTRAST
TECHNIQUE: Multidetector CT imaging of the abdomen was performed following the
standard protocol without IV contrast.

[Series 3: abd/ pelvis 5.0 i30f 2 · axial · 0.88mm/px · z∈[+1196,+1426]mm · 11 of 52 slices shown, 13 images]
[im 3/52  soft-tissue]
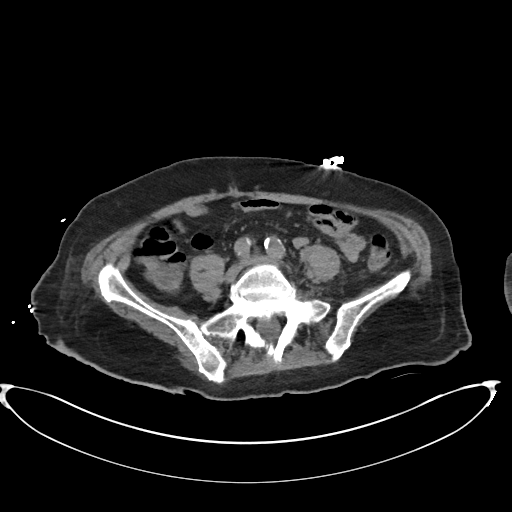
[im 3/52  bone]
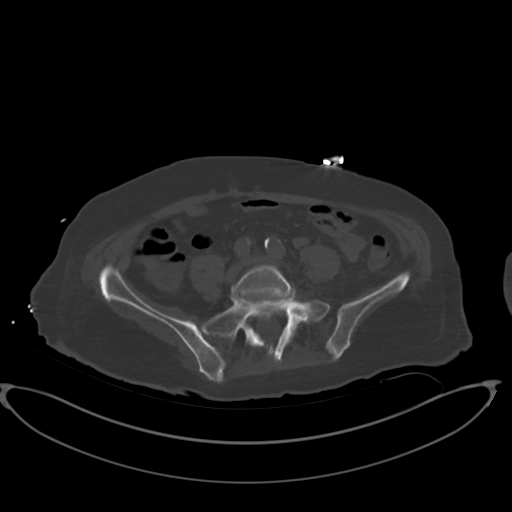
[im 7/52  soft-tissue]
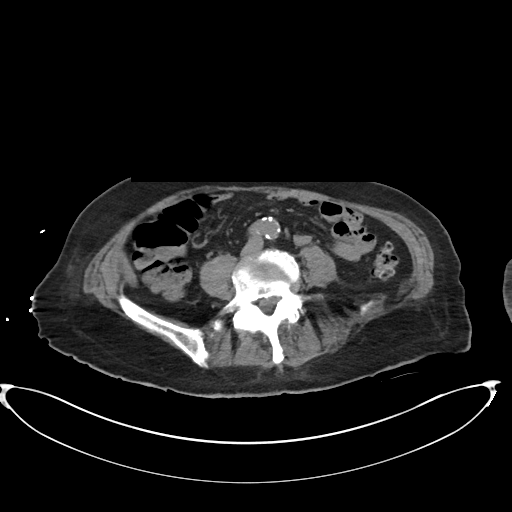
[im 12/52  soft-tissue]
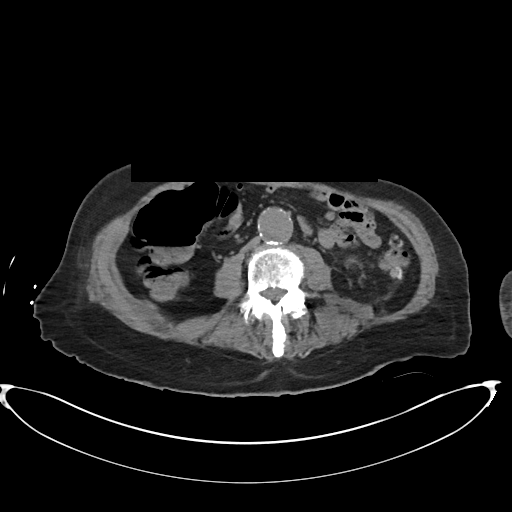
[im 17/52  soft-tissue]
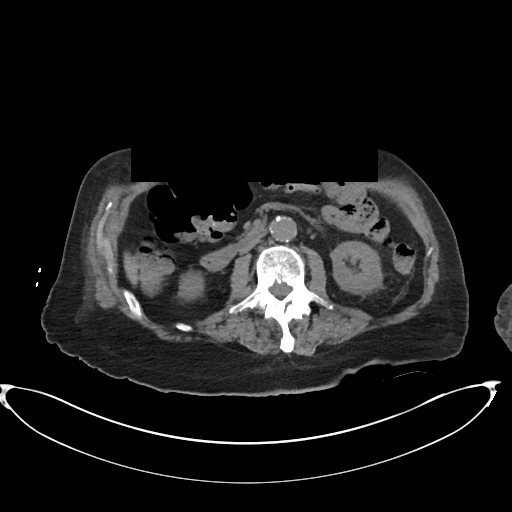
[im 21/52  soft-tissue]
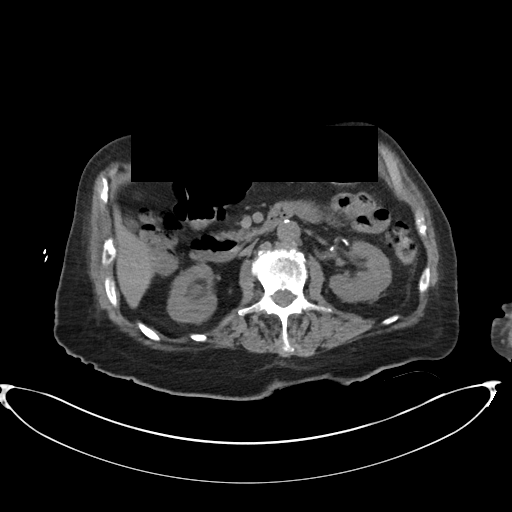
[im 26/52  soft-tissue]
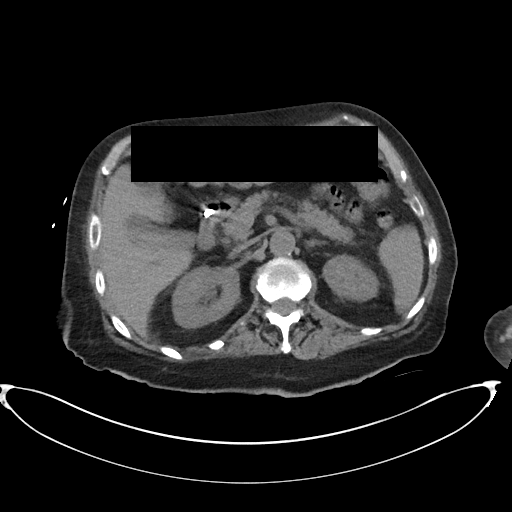
[im 31/52  soft-tissue]
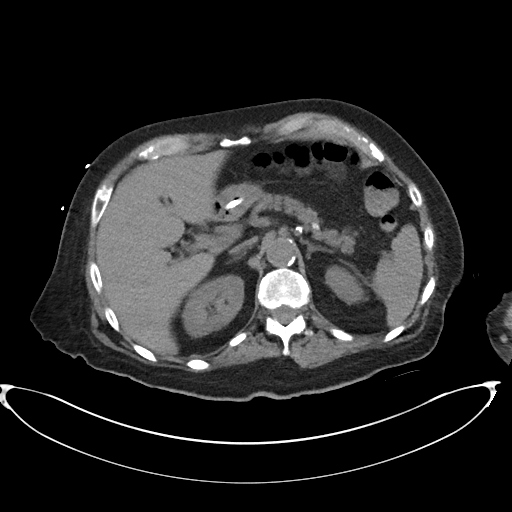
[im 35/52  soft-tissue]
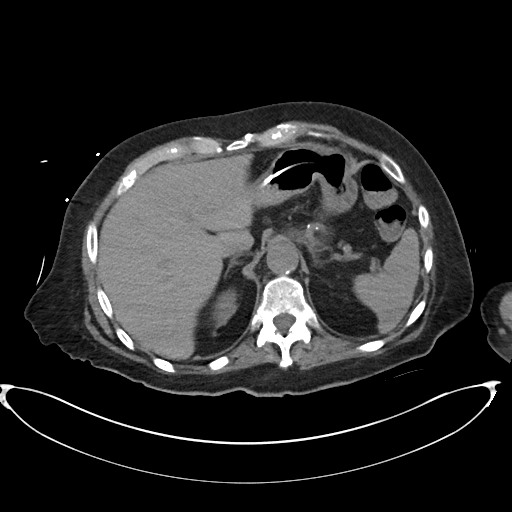
[im 40/52  soft-tissue]
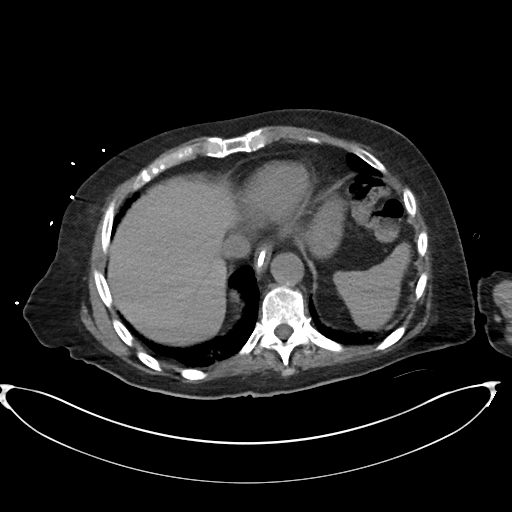
[im 40/52  bone]
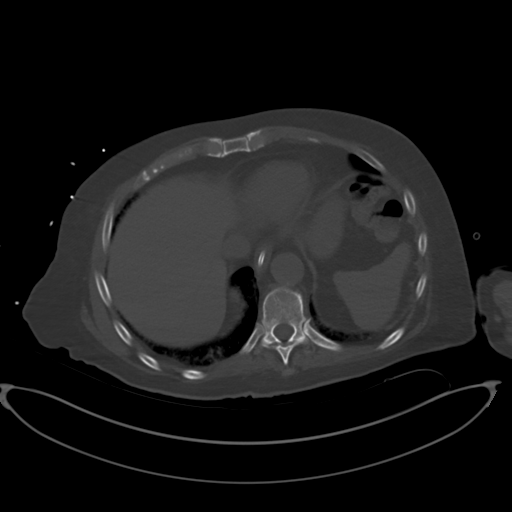
[im 45/52  soft-tissue]
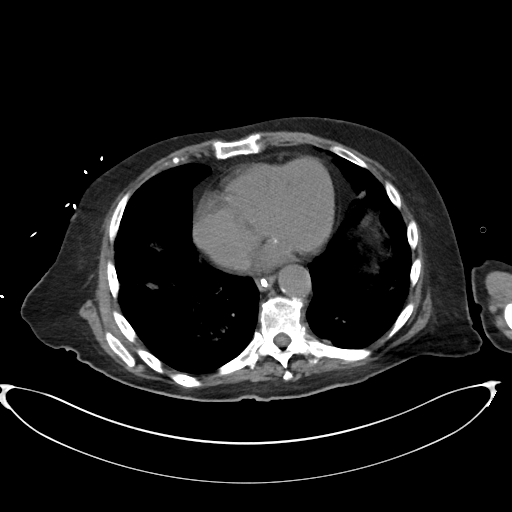
[im 49/52  soft-tissue]
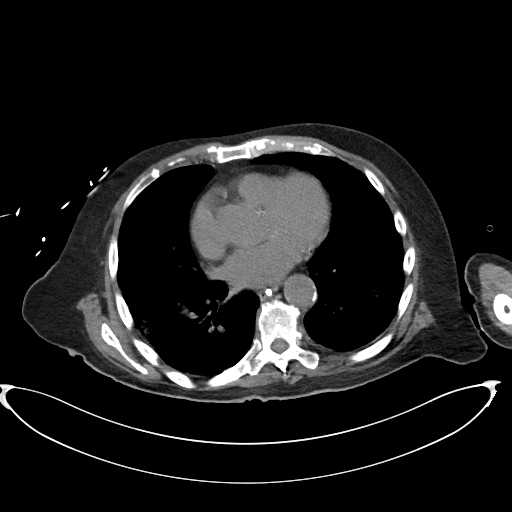

[Series 6: cor st · coronal · 0.56mm/px · 3 of 78 slices shown]
[im 26/78  soft-tissue]
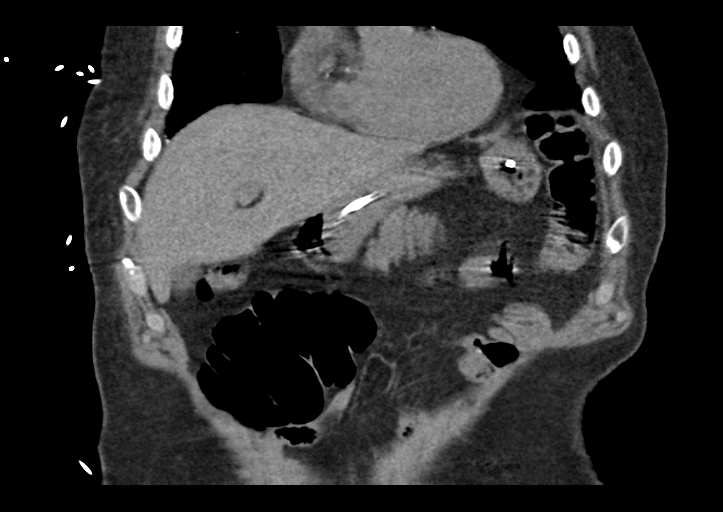
[im 35/78  soft-tissue]
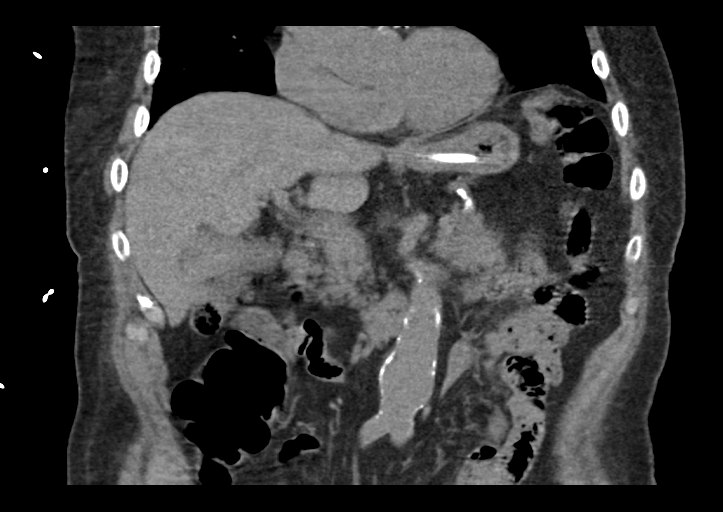
[im 43/78  soft-tissue]
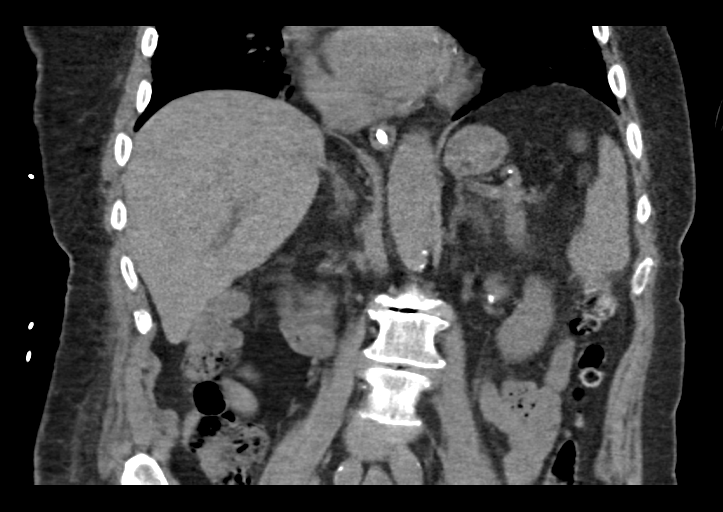

[14 of 46 positions shown; findings below may reference images not displayed]

FINDINGS: Lower chest: Limited visualization of the lower thorax demonstrates
bibasilar ground-glass interstitial opacities with relative
subpleural sparing. Subpleural nodular opacities are seen within the
imaged lung bases. Overall, there is improved aeration of lung bases
compared to the 06/01/2020 examination. No air bronchograms. No
pleural effusion.

Normal heart size. Coronary artery calcifications. No pericardial
effusion.

Hepatobiliary: Normal hepatic contour. High-density material is seen
within the gallbladder fossa potentially radiopaque sludge and/or
stones. No noncontrast evidence of gallbladder wall thickening or
pericholecystic stranding. No ascites.

Pancreas: Normal noncontrast appearance of the pancreas.

Spleen: Normal noncontrast appearance of the spleen.

Adrenals/Urinary Tract: No renal stones. There is a minimal amount
of grossly symmetric likely age and body habitus related perinephric
stranding. No urine obstruction.

Normal noncontrast appearance the bilateral adrenal glands. The
urinary bladder was not imaged.

Stomach/Bowel: The anterior wall of the mid body of the stomach is
well apposed against the ventral wall of the upper abdomen without
interposition of either the hepatic parenchyma or transverse colon
and the percutaneous window will likely be improved with gastric
insufflation.

Enteric tube tip terminates within the duodenal bulb. Scattered
colonic diverticulosis. The cecum is noted to be located within the
right upper abdominal quadrant. Normal noncontrast appearance of the
terminal ileum and appendix. No pneumoperitoneum, pneumatosis or
portal venous gas.

Vascular/Lymphatic: Aneurysmal dilatation of the infrarenal
abdominal aorta measuring approximately 3.0 x 3.0 x 3.0 cm as
measured in greatest oblique short axis axial (image 41, series 3),
coronal (image 34, series 6) and sagittal (image 60, series 7)
dimensions respectively. There is a moderate to large amount of
eccentric mixed calcified and noncalcified atherosclerotic plaque
throughout the abdominal aorta. Mild ectasia of the right common
iliac artery measuring 1.6 cm in diameter.

No bulky retroperitoneal or mesenteric lymphadenopathy on this
noncontrast examination.

Other: Mild diffuse body wall anasarca.

Musculoskeletal: No acute or aggressive osseous abnormalities.
Moderate severe multilevel lumbar spine DDD, worse at L1-L2, and a
lesser extent, L3-L4 and L5-S1 with disc space height loss, endplate
irregularity and sclerosis. Grade 1 anterolisthesis of L3 upon L4
measuring approximately 4 mm. No associated pars defects.
IMPRESSION: 1. Gastric anatomy amenable to potential percutaneous gastrostomy
tube placement as indicated.
2. Cholelithiasis without evidence of cholecystitis.
3. Colonic diverticulosis without evidence of diverticulitis.
4. Improved aeration of the lung bases compared to the 06/01/2020
examination with persistent bibasilar ground-glass interstitial
opacities and subpleural nodular opacities, nonspecific though
compatible with provided history of E1V22-NU infection.
5. Aneurysmal dilatation of the infrarenal abdominal aorta measuring
3.0 cm in diameter. Recommend follow-up aortic ultrasound in 3
years. This recommendation follows ACR consensus guidelines: White
Paper of the ACR Incidental Findings Committee II on Vascular
Findings. [HOSPITAL] 8448; [DATE].
6. Coronary calcifications.  Aortic Atherosclerosis ([id number]).
